# Patient Record
Sex: Male | Born: 1945 | Race: White | Hispanic: No | Marital: Married | Smoking: Never smoker
Health system: Southern US, Community
[De-identification: ages and names within clinical notes are randomized; demographics above are authoritative.]

## PROBLEM LIST (undated history)

## (undated) DIAGNOSIS — I739 Peripheral vascular disease, unspecified: Secondary | ICD-10-CM

## (undated) DIAGNOSIS — Z8719 Personal history of other diseases of the digestive system: Secondary | ICD-10-CM

## (undated) DIAGNOSIS — F419 Anxiety disorder, unspecified: Secondary | ICD-10-CM

## (undated) DIAGNOSIS — D649 Anemia, unspecified: Secondary | ICD-10-CM

## (undated) DIAGNOSIS — K219 Gastro-esophageal reflux disease without esophagitis: Secondary | ICD-10-CM

## (undated) DIAGNOSIS — N400 Enlarged prostate without lower urinary tract symptoms: Secondary | ICD-10-CM

## (undated) DIAGNOSIS — I2510 Atherosclerotic heart disease of native coronary artery without angina pectoris: Secondary | ICD-10-CM

## (undated) DIAGNOSIS — F32A Depression, unspecified: Secondary | ICD-10-CM

## (undated) DIAGNOSIS — G4700 Insomnia, unspecified: Secondary | ICD-10-CM

## (undated) DIAGNOSIS — E785 Hyperlipidemia, unspecified: Secondary | ICD-10-CM

## (undated) DIAGNOSIS — I639 Cerebral infarction, unspecified: Secondary | ICD-10-CM

## (undated) DIAGNOSIS — E7800 Pure hypercholesterolemia, unspecified: Secondary | ICD-10-CM

## (undated) DIAGNOSIS — H9190 Unspecified hearing loss, unspecified ear: Secondary | ICD-10-CM

## (undated) HISTORY — DX: Depression, unspecified: F32.A

## (undated) HISTORY — DX: Atherosclerotic heart disease of native coronary artery without angina pectoris: I25.10

## (undated) HISTORY — DX: Hyperlipidemia, unspecified: E78.5

## (undated) HISTORY — DX: Gastro-esophageal reflux disease without esophagitis: K21.9

## (undated) HISTORY — PX: ROTATOR CUFF REPAIR: SHX139

## (undated) HISTORY — DX: Anxiety disorder, unspecified: F41.9

## (undated) HISTORY — DX: Pure hypercholesterolemia, unspecified: E78.00

## (undated) HISTORY — DX: Benign prostatic hyperplasia without lower urinary tract symptoms: N40.0

## (undated) HISTORY — DX: Insomnia, unspecified: G47.00

---

## 1997-09-01 HISTORY — PX: INGUINAL HERNIA REPAIR: SUR1180

## 2001-02-05 ENCOUNTER — Encounter (INDEPENDENT_AMBULATORY_CARE_PROVIDER_SITE_OTHER): Payer: Self-pay | Admitting: Specialist

## 2001-02-05 ENCOUNTER — Other Ambulatory Visit: Admission: RE | Admit: 2001-02-05 | Discharge: 2001-02-05 | Payer: Self-pay | Admitting: Gastroenterology

## 2006-09-01 DIAGNOSIS — I219 Acute myocardial infarction, unspecified: Secondary | ICD-10-CM

## 2006-09-01 HISTORY — DX: Acute myocardial infarction, unspecified: I21.9

## 2007-12-24 ENCOUNTER — Ambulatory Visit: Payer: Self-pay | Admitting: Cardiology

## 2008-05-09 ENCOUNTER — Ambulatory Visit: Payer: Self-pay | Admitting: Cardiology

## 2008-06-26 ENCOUNTER — Ambulatory Visit: Payer: Self-pay | Admitting: Cardiology

## 2008-06-26 LAB — CONVERTED CEMR LAB
ALT: 30 units/L (ref 0–53)
AST: 24 units/L (ref 0–37)
Albumin: 3.6 g/dL (ref 3.5–5.2)
Alkaline Phosphatase: 75 units/L (ref 39–117)
Bilirubin, Direct: 0.2 mg/dL (ref 0.0–0.3)
Cholesterol: 164 mg/dL (ref 0–200)
HDL: 26.6 mg/dL — ABNORMAL LOW (ref >39.0)
LDL Cholesterol: 115 mg/dL — ABNORMAL HIGH (ref 0–99)
Total Bilirubin: 1.4 mg/dL — ABNORMAL HIGH (ref 0.3–1.2)
Total CHOL/HDL Ratio: 6.2
Total Protein: 7 g/dL (ref 6.0–8.3)
Triglycerides: 110 mg/dL (ref 0–149)
VLDL: 22 mg/dL (ref 0–40)

## 2008-11-09 ENCOUNTER — Ambulatory Visit: Payer: Self-pay | Admitting: Cardiology

## 2009-01-01 ENCOUNTER — Telehealth: Payer: Self-pay | Admitting: Cardiology

## 2009-01-03 ENCOUNTER — Telehealth: Payer: Self-pay | Admitting: Cardiology

## 2009-02-28 ENCOUNTER — Encounter: Payer: Self-pay | Admitting: Cardiology

## 2009-03-01 ENCOUNTER — Encounter: Payer: Self-pay | Admitting: Cardiology

## 2009-03-02 ENCOUNTER — Telehealth: Payer: Self-pay | Admitting: Cardiology

## 2009-06-21 ENCOUNTER — Telehealth: Payer: Self-pay | Admitting: Cardiology

## 2009-07-03 ENCOUNTER — Telehealth: Payer: Self-pay | Admitting: Cardiology

## 2009-07-13 ENCOUNTER — Encounter: Payer: Self-pay | Admitting: Cardiology

## 2009-07-13 ENCOUNTER — Telehealth: Payer: Self-pay | Admitting: Cardiology

## 2009-07-13 ENCOUNTER — Ambulatory Visit: Payer: Self-pay

## 2009-07-13 DIAGNOSIS — R002 Palpitations: Secondary | ICD-10-CM | POA: Insufficient documentation

## 2009-07-17 ENCOUNTER — Telehealth: Payer: Self-pay | Admitting: Cardiology

## 2009-08-01 DIAGNOSIS — K219 Gastro-esophageal reflux disease without esophagitis: Secondary | ICD-10-CM | POA: Insufficient documentation

## 2009-08-01 DIAGNOSIS — K5190 Ulcerative colitis, unspecified, without complications: Secondary | ICD-10-CM | POA: Insufficient documentation

## 2009-08-01 DIAGNOSIS — E785 Hyperlipidemia, unspecified: Secondary | ICD-10-CM | POA: Insufficient documentation

## 2009-08-01 DIAGNOSIS — I25119 Atherosclerotic heart disease of native coronary artery with unspecified angina pectoris: Secondary | ICD-10-CM | POA: Insufficient documentation

## 2009-08-01 DIAGNOSIS — I2510 Atherosclerotic heart disease of native coronary artery without angina pectoris: Secondary | ICD-10-CM | POA: Insufficient documentation

## 2009-08-06 ENCOUNTER — Ambulatory Visit: Payer: Self-pay | Admitting: Cardiology

## 2009-08-13 ENCOUNTER — Telehealth (INDEPENDENT_AMBULATORY_CARE_PROVIDER_SITE_OTHER): Payer: Self-pay | Admitting: *Deleted

## 2009-08-14 ENCOUNTER — Encounter (HOSPITAL_COMMUNITY): Admission: RE | Admit: 2009-08-14 | Discharge: 2009-08-31 | Payer: Self-pay | Admitting: Cardiology

## 2009-08-14 ENCOUNTER — Ambulatory Visit: Payer: Self-pay | Admitting: Cardiovascular Disease

## 2009-08-14 ENCOUNTER — Ambulatory Visit: Payer: Self-pay | Admitting: Cardiology

## 2009-08-14 ENCOUNTER — Ambulatory Visit: Payer: Self-pay

## 2009-08-20 ENCOUNTER — Telehealth: Payer: Self-pay | Admitting: Cardiology

## 2009-09-04 ENCOUNTER — Encounter: Payer: Self-pay | Admitting: Cardiology

## 2009-09-04 LAB — CONVERTED CEMR LAB
ALT: 30 units/L (ref 0–53)
AST: 27 units/L (ref 0–37)
Albumin: 4 g/dL (ref 3.5–5.2)
Alkaline Phosphatase: 58 units/L (ref 39–117)
Bilirubin, Direct: 0.3 mg/dL (ref 0.0–0.3)
Cholesterol: 161 mg/dL (ref 0–200)
HDL: 38.9 mg/dL — ABNORMAL LOW (ref >39.00)
LDL Cholesterol: 91 mg/dL (ref 0–99)
TSH: 1.46 microintl units/mL (ref 0.35–5.50)
Total Bilirubin: 2.9 mg/dL — ABNORMAL HIGH (ref 0.3–1.2)
Total CHOL/HDL Ratio: 4
Total Protein: 7.1 g/dL (ref 6.0–8.3)
Triglycerides: 158 mg/dL — ABNORMAL HIGH (ref 0.0–149.0)
VLDL: 31.6 mg/dL (ref 0.0–40.0)

## 2009-11-22 ENCOUNTER — Encounter: Payer: Self-pay | Admitting: Cardiology

## 2010-04-25 ENCOUNTER — Encounter: Payer: Self-pay | Admitting: Cardiology

## 2010-05-01 ENCOUNTER — Ambulatory Visit: Payer: Self-pay | Admitting: Cardiology

## 2010-06-17 ENCOUNTER — Telehealth: Payer: Self-pay | Admitting: Cardiology

## 2010-09-13 ENCOUNTER — Encounter: Payer: Self-pay | Admitting: Cardiology

## 2010-09-19 ENCOUNTER — Telehealth: Payer: Self-pay | Admitting: Cardiology

## 2010-10-01 NOTE — Progress Notes (Signed)
Summary: having colonoscopy/ok to hold plavix and asa?  Phone Note Call from Patient Call back at [phone]   Caller: Patient Summary of Call: pt calling regarding a procedure and the meds he is taking Initial call taken by: Judie Grieve,  June 21, 2009 12:59 PM  Follow-up for Phone Call        having a colonoscopy next month, is it ok to hold asa and plavix for 3 days prior??  Sander Nephew, RN  Additional Follow-up for Phone Call Additional follow up Details #1::        I told him not to come off the Plavix. He needs to talk to his GI doctor about doing a diagnostic colonoscopy only on Plavix.

## 2010-10-01 NOTE — Assessment & Plan Note (Signed)
Summary: Cardiology Nuclear Study  Nuclear Med Background Indications for Stress Test: Evaluation for Ischemia   History: Heart Catheterization, Stents  History Comments: 8/08 Heart Cath > Stents (3) RCA  Symptoms: DOE, Palpitations    Nuclear Pre-Procedure Cardiac Risk Factors: Lipids Caffeine/Decaff Intake: None NPO After: 7:00 PM Lungs: clear IV 0.9% NS with Angio Cath: 22g     IV Site: (R) AC IV Started by: Irean Hong RN Chest Size (in) 42     Height (in): 73 Weight (lb): 206 BMI: 27.28 Tech Comments: Held toprol 24 hrs.  Nuclear Med Study 1 or 2 day study:  1 day     Stress Test Type:  Stress Reading MD:  Charlton Haws, MD     Referring MD:  J.Hochrein Resting Radionuclide:  Technetium 27m Tetrofosmin     Resting Radionuclide Dose:  11 mCi  Stress Radionuclide:  Technetium 40m Tetrofosmin     Stress Radionuclide Dose:  33 mCi   Stress Protocol Exercise Time (min):  9:00 min     Max HR:  139 bpm     Predicted Max HR:  157 bpm  Max Systolic BP: 154 mm Hg     Percent Max HR:  88.54 %     METS: 10.10 Rate Pressure Product:  53664    Stress Test Technologist:  Milana Na EMT-P     Nuclear Technologist:  Domenic Polite CNMT  Rest Procedure  Myocardial perfusion imaging was performed at rest 45 minutes following the intravenous administration of Myoview Technetium 34m Tetrofosmin.  Stress Procedure  The patient exercised for 9:00. The patient stopped due to fatigue and denied any chest pain.  There were no significant ST-T wave changes, occ pvcs with exercise and freq pvcs in recovery.  Myoview was injected at peak exercise and myocardial perfusion imaging was performed after a brief delay.  QPS Raw Data Images:  Normal; no motion artifact; normal heart/lung ratio. Stress Images:  Inferior defect Rest Images:  Ifneriro defect Subtraction (SDS):  SDS 0 Transient Ischemic Dilatation:  .83  (Normal <1.22)  Lung/Heart Ratio:  .37  (Normal <0.45)  Quantitative  Gated Spect Images QGS EDV:  96 ml QGS ESV:  32 ml QGS EF:  67 % QGS cine images:  normal  Findings Low risk nuclear study  Evidence for inferior infarct     Overall Impression  Exercise Capacity: Good exercise capacity. BP Response: Normal blood pressure response. Clinical Symptoms: No chest pain ECG Impression: No significant ST segment change suggestive of ischemia. 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  No further testing at this point.  Continue with primary risk reduction.  Appended Document: Cardiology Nuclear Study pt aware of results

## 2010-10-01 NOTE — Miscellaneous (Signed)
Summary: Orders Update  Clinical Lists Changes  Problems: Added new problem of PALPITATIONS (ICD-785.1) Orders: Added new Referral order of Holter Monitor (Holter Monitor) - Signed 

## 2010-10-01 NOTE — Progress Notes (Signed)
Summary: problems with medications  Phone Note Call from Patient Call back at [phone]   Caller: Patient Reason for Call: Talk to Nurse Summary of Call: Problems with medications.  Primary MD suggested he call to discuss. Initial call taken by: Burnard Leigh,  March 02, 2009 3:19 PM  Follow-up for Phone Call        per pt call states metoprolol was changed about 1 month ago from 100mg  daily to 50mg  two times a day and he is now having alot of dizziness in the afternoons to were he feels like he will pass out.  pt requested to to changes back to 100mg  daily.  rx will be sent into [facility] on [address], [place], [place] Follow-up by: Charolotte Capuchin, RN,  March 02, 2009 3:41 PM

## 2010-10-01 NOTE — Letter (Signed)
Summary: [facility] Office Note  [facility] Office Note   Imported By: Roderic Ovens 12/03/2009 16:00:01  _____________________________________________________________________  External Attachment:    Type:   Image     Comment:   External Document

## 2010-10-01 NOTE — Letter (Signed)
Summary: Lipid/Liver Garment/textile technologist, Main Office  [address]   [place], [place] [address]   Phone: [phone]  Fax: [phone]         May 01, 2010 [name]: [id number]     Martin [name] [address] [place], [place]  [address]     Dear [name],  During your last appointment, Dr.  Antoine Poche requested you have fasting lab work to check your lipid and liver (DX: 272.2, V58.69).  Please have this blood work done in December 2011.  It is important that patients and their doctor work together in the management/treatment of their health care.  If you have already had your blood work drawn, please disregard this letter.  If you have not had your blood work drawn, please call [phone] or the number listed above to schedule an appointment.    Please bring this letter with you for your blood work.  Also, please remember not to eat or drink anything after midnight the night before.      Thank you,     Sander Nephew, RN for Dr. [facility] Rot[facility]

## 2010-10-01 NOTE — Progress Notes (Signed)
Summary: Nuclear Pre-procedure  Phone Note Outgoing Call Call back at [facility] Phone [phone]   Call placed by: Stanton Kidney, EMT-P,  August 13, 2009 1:42 PM Action Taken: Phone Call Completed Summary of Call: Reviewed information on Myoview Information Sheet (see scanned document for further details).  Spoke with Patients wife.    Nuclear Med Background Indications for Stress Test: Evaluation for Ischemia   History: Heart Catheterization, Stents  History Comments: 8/08 Heart Cath > Stents (3) RCA  Symptoms: DOE, Palpitations    Nuclear Pre-Procedure Cardiac Risk Factors: Lipids Height (in): 73

## 2010-10-01 NOTE — Letter (Signed)
Summary: Custom - Lipid  [facility], Main Office  [address]   [place], [place] [address]   Phone: [phone]  Fax: [phone]         September 04, 2009 [name]: [id number]     [name] [address] [place], [place]  [address]   Dear Martin [name],  We have reviewed your cholesterol results.  They are as follows:     Total Cholesterol:    161 (Desirable: less than 200)       HDL  Cholesterol:     38.90  (Desirable: greater than 40 for men and 50 for women)       LDL Cholesterol:       91  (Desirable: less than 100 for low risk and less than 70 for moderate to high risk)       Triglycerides:       158.0  (Desirable: less than 150)  Our recommendations include:  continue the same/looks good!!   Call our office at the number listed above if you have any questions.  Lowering your LDL cholesterol is important, but it is only one of a large number of "risk factors" that may indicate that you are at risk for heart disease, stroke or other complications of hardening of the arteries.  Other risk factors include:   A.  Cigarette Smoking* B.  High Blood Pressure* C.  Obesity* D.   Low HDL Cholesterol (see yours above)* E.   Diabetes Mellitus (higher risk if your is uncontrolled) F.  Family history of premature heart disease G.  Previous history of stroke or cardiovascular disease          *These are risk factors YOU HAVE CONTROL OVER.  For more information, visit .  There is now evidence that lowering the TOTAL CHOLESTEROL AND LDL CHOLESTEROL can reduce the risk of heart disease.  The American Heart Association recommends the following guidelines for the treatment of elevated cholesterol:  1.  If there is now current heart disease and less than two risk factors, TOTAL CHOLESTEROL should be less than 200 and LDL CHOLESTEROL should be less than 100. 2.  If there is current heart disease or two or more risk factors, TOTAL CHOLESTEROL should be less than  200 and LDL CHOLESTEROL should be less than 70.  A diet low in cholesterol, saturated fat, and calories is the cornerstone of treatment for elevated cholesterol.  Cessation of smoking and exercise are also important in the management of elevated cholesterol and preventing vascular disease.  Studies have shown that 30 to 60 minutes of physical activity most days can help lower blood pressure, lower cholesterol, and keep your weight at a healthy level.  Drug therapy is used when cholesterol levels do not respond to therapeutic lifestyle changes (smoking cessation, diet, and exercise) and remains unacceptably high.  If medication is started, it is important to have you levels checked periodically to evaluate the need for further treatment options.  Thank you,    Nolen Mu for Dr [facility] Team

## 2010-10-01 NOTE — Miscellaneous (Signed)
  Clinical Lists Changes  Observations: Added new observation of NUCLEAR NOS: Exercise Capacity: Good exercise capacity. BP Response: Normal blood pressure response. Clinical Symptoms: No chest pain ECG Impression: No significant ST segment change suggestive of ischemia. 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 BP Response: Normal blood pressure response. Clinical Symptoms: No chest pain ECG Impression: No significant ST segment change suggestive of ischemia. 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

## 2010-10-01 NOTE — Progress Notes (Signed)
Summary: BEEN HAVING IRREGULAR HEART BEATS  GOING ON FOR FEW DAYS  Phone Note Call from Patient Call back at Home Phone [phone] Call back at [phone]   Caller: Patient Summary of Call: PT HAVING IRRGULAR HEART BEATS FOR A FEW DAYS Initial call taken by: Judie Grieve,  July 13, 2009 9:23 AM  Follow-up for Phone Call        FUNNY FEELING WITH A LITTLE BIT A PAIN EVERY NOW AND THEN.  WIFE CHECKED HIS PULSE LAST NIGHT AND SAID IT FEELS  LIKE IT SKIPS EVERY NOW AND THEN, NOT DOING IT TODAY.  HAS HAPPENED FOR A COUPLE OR 3 DAYS.  HE NOTICES IT MORE WHILE SITTTING DOWN CAUSING HIM TO FEEL SOB NO DIZZINESS.  NO HX OF IRREGULAR HEART BEATS  PAM FLEMING-HAYES,RN DR Jisele Price AWARE OF ABOVE INFORMATION AND HAS GIVENA VERBAL ORDER FOR PT TO WEAR A 48 HOUR HOLTER MONITOR FOR PALPITATION.  PT AWARE. Follow-up by: Charolotte Capuchin, RN,  July 13, 2009 10:45 AM

## 2010-10-01 NOTE — Progress Notes (Signed)
Summary: rx viagra  Phone Note Call from Patient Call back at [phone]   Caller: Patient Reason for Call: Talk to Nurse Summary of Call: REQ TO TALK TO NURSE ABOUT MEDS Initial call taken by: Migdalia Dk,  July 03, 2009 2:55 PM  Follow-up for Phone Call        pt wondering if Dr Antoine Poche can give him an rx for Viagra  [facility] in [place], [place]  Sander Nephew, RN  Additional Follow-up for Phone Call Additional follow up Details #1::        I reviewed recent notes and med list.  He is on no long term meds that would interact.  He can take  Viagra if he is not using other nitrates and has had no med changes or recent onset of chest pain. per Dr Antoine Poche. Pt is aware of above. Denies any medications other than those on his list and denies chest pain.  Additional Follow-up by: Charolotte Capuchin, RN,  July 03, 2009 5:49 PM    New/Updated Medications: VIAGRA 50 MG TABS (SILDENAFIL CITRATE) 1 by mouth 1 hour prior to sexual activity Prescriptions: VIAGRA 50 MG TABS (SILDENAFIL CITRATE) 1 by mouth 1 hour prior to sexual activity  #10 x 1   Entered by:   Charolotte Capuchin, RN   Authorized by:   Rollene Rotunda, MD, [facility]   Signed by:   Charolotte Capuchin, RN on 07/03/2009   Method used:   Electronically to        [facility] [address] (retail)       [address]       [place], [place]  [address]       Ph: [phone] or [phone]       Fax: [phone]   RxID:   [id number]

## 2010-10-01 NOTE — Assessment & Plan Note (Signed)
Summary: f/u pvc's appt at 2pm  pfh, rn   Visit Type:  Follow-up Primary Provider:  Dr. Feliciana Rossetti  CC:  Palpitatoins and CAD.  History of Present Illness: The patient presents for followup. Since I last saw him he called to tell us of increasing palpitations. These were happening with frequency. He notices them now still. He describes skipped beats. They are more noticeable when he is at rest.  He has had no presyncope or syncope. He is not having any chest pressure, neck or arm discomfort. However, he has noticed some increased dyspnea with activity.  He is not describing PND or orthopnea.  A Holter monitor demonstrated frequent ventricular ectopy approximately 5% of his total beats. He had bigeminy and couplets noted. There was no nonsustained ventricular tachycardia. The patient does drink 2 cups of coffee per day but this is unchanged.  Current Medications (verified): 1)  Niaspan 1000 Mg Cr-Tabs (Niacin (Antihyperlipidemic)) .... Take 2 Tablet By Mouth Once A Day 2)  Nitroglycerin 0.4 Mg Subl (Nitroglycerin) .... Place 1 Tablet Under Tongue As Directed 3)  Plavix 75 Mg Tabs (Clopidogrel Bisulfate) .... Take 1 Tablet By Mouth Once A Day 4)  Simvastatin 40 Mg Tabs (Simvastatin) .... Take 1 Tablet By Mouth Once A Day 5)  Toprol Xl 50 Mg Xr24h-Tab (Metoprolol Succinate) .Marland Kitchen.. 1 By Mouth Daily 6)  Viagra 50 Mg Tabs (Sildenafil Citrate) .Marland Kitchen.. 1 By Mouth 1 Hour Prior To Sexual Activity 7)  Aspirin 81 Mg  Tabs (Aspirin) .Marland Kitchen.. 1 By Mouth Daily 8)  Asacol 400 Mg Tbec (Mesalamine) .... 2 By Mouth Two Times A Day 9)  Loratadine 10 Mg Tabs (Loratadine) .Marland Kitchen.. 1 By Mouth Daily 10)  Flomax 0.4 Mg Caps (Tamsulosin Hcl) .Marland Kitchen.. 1 By Mouth Daily 11)  [facility] Spectravite Senior  Tabs (Multiple Vitamins-Minerals) .Marland Kitchen.. 1 By Mouth Daily 12)  Multivitamins   Tabs (Multiple Vitamin) .Marland Kitchen.. 1 By Mouth Daily 13)  Zantac 75 75 Mg Tabs (Ranitidine Hcl) .Marland Kitchen.. 1 By Mouth Daily  Allergies (verified): 1)  ! Morphine  Past  History:  Past Medical History: 1. Coronary artery disease (catheterization in August 2008 with 90-95%       right coronary artery stenosis.  He had a drug-eluting stent       placed.  He had an acute stent thrombosis in the hospital that day       and subsequently had 3 drug-coated stents placed in the right       coronary artery.  The LAD had 45% stenosis, in the circumflex had       35% stenosis.  The EF was well preserved).   2. Gastroesophageal reflux disease.   3. Dyslipidemia.   4. Benign prostatic hypertrophy.   5. Ulcerative colitis.   Past Surgical History: Inguinal hernia repair in 1999  Review of Systems       As stated in the HPI and negative for all other systems.   Vital Signs:  Patient profile:   65 year old male Height:      73 inches Weight:      206 pounds BMI:     27.28 Pulse rate:   73 / minute Resp:     16 per minute BP sitting:   127 / 67  (right arm)  Vitals Entered By: Marrion Coy, CNA (August 06, 2009 1:55 PM)  Physical Exam  General:  Well developed, well nourished, in no acute distress. Head:  normocephalic and atraumatic Eyes:  PERRLA/EOM intact;  conjunctiva and lids normal. Mouth:  Upper dentures, gums and palate normal. Oral mucosa normal. Neck:  Neck supple, no JVD. No masses, thyromegaly or abnormal cervical nodes. Chest Wall:  no deformities or breast masses noted Lungs:  Clear bilaterally to auscultation and percussion. Abdomen:  Bowel sounds positive; abdomen soft and non-tender without masses, organomegaly, or hernias noted. No hepatosplenomegaly. Msk:  Back normal, normal gait. Muscle strength and tone normal. Extremities:  No clubbing or cyanosis. Neurologic:  Alert and oriented x 3. Skin:  Intact without lesions or rashes. Cervical Nodes:  no significant adenopathy Axillary Nodes:  no significant adenopathy Inguinal Nodes:  no significant adenopathy Psych:  Normal affect.   Detailed Cardiovascular Exam  Neck     Carotids: Carotids full and equal bilaterally without bruits.      Neck Veins: Normal, no JVD.    Heart    Inspection: no deformities or lifts noted.      Palpation: normal PMI with no thrills palpable.      Auscultation: regular rate and rhythm, S1, S2 without murmurs, rubs, gallops, or clicks.    Vascular    Abdominal Aorta: no palpable masses, pulsations, or audible bruits.      Femoral Pulses: normal femoral pulses bilaterally.      Pedal Pulses: normal pedal pulses bilaterally.      Radial Pulses: normal radial pulses bilaterally.      Peripheral Circulation: no clubbing, cyanosis, or edema noted with normal capillary refill.     Impression & Recommendations:  Problem # 1:  PALPITATIONS (ICD-785.1) The patient has increasing ventricular ectopy with couplets and bigeminy. He does have some mild increased dyspnea with exertion. He has coronary disease as described above. Given this the pretest probability of obstructive coronary disease is high. Stress perfusion imaging is indicated. The patient will present for this. At that time I will also check a TSH. We did discuss symptomatic treatment of this. If there is no reversible cause and he is found to have a well-preserved ejection fraction on stress testing and no ischemia he would prefer conservative therapy. He would consider stopping caffeine as a first step. Orders: Nuclear Stress Test (Nuc Stress Test)  Problem # 2:  DYSLIPIDEMIA (ICD-272.4) His primary care doctor was kind enough to send his lipid profile this summer. His HDL was low. We increased his Niaspan from 1000-2000 mg daily. He was also instructed to increase his exercise. I will have him repeat a fasting lipid profile when he comes back for a stress test.  Problem # 3:  CAD (ICD-414.00) This will be evaluated as above. Orders: Nuclear Stress Test (Nuc Stress Test)  Patient Instructions: 1)  Your physician recommends that you schedule a follow-up appointment in 6  months with Dr Antoine Poche 2)  Your physician recommends that you continue on your current medications as directed. Please refer to the Current Medication list given to you today. 3)  Your physician recommends that you return for lab work the same day as your myoview for a fasting lipid, liver and tsh. 4)  Your physician has requested that you have an exercise stress myoview.  For further information please visit [email or web address]  Please follow instruction sheet, as given.

## 2010-10-01 NOTE — Progress Notes (Signed)
Summary: MED QUESTIONS  Phone Note From Pharmacy   Summary of Call: PHARAMICIST HAS A QUESTION CONCERNING PT MEDICATIONS THAT WERE SENT IN E-PERSCRIBE. [phone]. [facility] Initial call taken by: Edman Circle,  Jan 03, 2009 9:34 AM  Follow-up for Phone Call        [facility]  pharmacist Okey Dupre said that pt. has been on metoprolol ER dailey prescrived by another cardiologist. On 01/02/09 send on E-PERSCRIBE Metoprolol tatrate 50 mg two times a day. Does MD wants to change medication from long acting to short acting. According to [facility] CNA  Pt. has been on Metoprolol tatrate 50 mg two times a day. She got the dosage from pt. medication bottle. Pharmacist notified.  Ollen Gross, RN, BSN  Jan 03, 2009 10:29 AM

## 2010-10-01 NOTE — Assessment & Plan Note (Signed)
Summary: PER CHECK OUT/SF   Visit Type:  Follow-up Primary Provider:  Dr. Feliciana Rossetti  CC:  CAD.  History of Present Illness: The patient presents for followup of his known coronary disease. At the last appointment he had some dyspnea. I evaluated him with a stress perfusion study which demonstrated an old infarct but a preserved ejection fraction and no ischemia. He has since had no further complaints. He is getting no dyspnea. He has no chest pressure, neck or arm discomfort. He's had no palpitations, presyncope or syncope. He is dieting and walking and has lost 8 pounds.  Current Medications (verified): 1)  Niaspan 1000 Mg Cr-Tabs (Niacin (Antihyperlipidemic)) .... Take 2 Tablet By Mouth Once A Day 2)  Nitroglycerin 0.4 Mg Subl (Nitroglycerin) .... Place 1 Tablet Under Tongue As Directed 3)  Plavix 75 Mg Tabs (Clopidogrel Bisulfate) .... Take 1 Tablet By Mouth Once A Day 4)  Simvastatin 40 Mg Tabs (Simvastatin) .... Take 1 Tablet By Mouth Once A Day 5)  Toprol Xl 50 Mg Xr24h-Tab (Metoprolol Succinate) .Marland Kitchen.. 1 By Mouth Daily 6)  Viagra 50 Mg Tabs (Sildenafil Citrate) .Marland Kitchen.. 1 By Mouth 1 Hour Prior To Sexual Activity 7)  Aspirin 81 Mg  Tabs (Aspirin) .Marland Kitchen.. 1 By Mouth Daily 8)  Asacol 400 Mg Tbec (Mesalamine) .... 2 By Mouth Two Times A Day 9)  Loratadine 10 Mg Tabs (Loratadine) .Marland Kitchen.. 1 By Mouth Daily 10)  Flomax 0.4 Mg Caps (Tamsulosin Hcl) .Marland Kitchen.. 1 By Mouth Daily 11)  [facility] Spectravite Senior  Tabs (Multiple Vitamins-Minerals) .Marland Kitchen.. 1 By Mouth Daily 12)  Multivitamins   Tabs (Multiple Vitamin) .Marland Kitchen.. 1 By Mouth Daily 13)  Zantac 75 75 Mg Tabs (Ranitidine Hcl) .Marland Kitchen.. 1 By Mouth Daily  Allergies (verified): 1)  ! Morphine  Past History:  Past Medical History: Reviewed history from 08/06/2009 and no changes required. 1. Coronary artery disease (catheterization in August 2008 with 90-95%       right coronary artery stenosis.  He had a drug-eluting stent       placed.  He had an acute stent  thrombosis in the hospital that day       and subsequently had 3 drug-coated stents placed in the right       coronary artery.  The LAD had 45% stenosis, in the circumflex had       35% stenosis.  The EF was well preserved).   2. Gastroesophageal reflux disease.   3. Dyslipidemia.   4. Benign prostatic hypertrophy.   5. Ulcerative colitis.   Past Surgical History: Reviewed history from 08/06/2009 and no changes required. Inguinal hernia repair in 1999  Review of Systems       As stated in the HPI and negative for all other systems.   Vital Signs:  Patient profile:   65 year old male Height:      73 inches Weight:      198 pounds BMI:     26.22 Pulse rate:   58 / minute Resp:     16 per minute BP sitting:   104 / 68  (right arm)  Vitals Entered By: Marrion Coy, CNA (May 01, 2010 9:03 AM)  Physical Exam  General:  Well developed, well nourished, in no acute distress. Head:  normocephalic and atraumatic Neck:  Neck supple, no JVD. No masses, thyromegaly or abnormal cervical nodes. Chest Wall:  no deformities or breast masses noted Lungs:  Clear bilaterally to auscultation and percussion. Heart:  Non-displaced PMI, chest non-tender;  regular rate and rhythm, S1, S2 without murmurs, rubs or gallops. Carotid upstroke normal, no bruit. Normal abdominal aortic size, no bruits. Femorals normal pulses, no bruits. Pedals normal pulses. No edema, no varicosities. Abdomen:  Bowel sounds positive; abdomen soft and non-tender without masses, organomegaly, or hernias noted. No hepatosplenomegaly. Msk:  Back normal, normal gait. Muscle strength and tone normal. Pulses:  pulses normal in all 4 extremities Extremities:  No clubbing or cyanosis. Neurologic:  Alert and oriented x 3. Skin:  Intact without lesions or rashes. Cervical Nodes:  no significant adenopathy Axillary Nodes:  no significant adenopathy Inguinal Nodes:  no significant adenopathy Psych:  Normal  affect.   EKG  Procedure date:  05/01/2010  Findings:      Sinus rhythm, rate 58, axis within normal limits, intervals within normal limits, no acute ST-T wave changes.  Impression & Recommendations:  Problem # 1:  CAD (ICD-414.00) The patient has no new symptoms. He will continue with risk reduction. Orders: EKG w/ Interpretation (93000)  Problem # 2:  DYSLIPIDEMIA (ICD-272.4)  I will ask him to get a lipid profile in December. He was at target at his last check.  His updated medication list for this problem includes:    Niaspan 1000 Mg Cr-tabs (Niacin (antihyperlipidemic)) .Marland Kitchen... Take 2 tablet by mouth once a day    Simvastatin 40 Mg Tabs (Simvastatin) .Marland Kitchen... Take 1 tablet by mouth once a day  Patient Instructions: 1)  Your physician recommends that you schedule a follow-up appointment in: 12 months with Dr Antoine Poche 2)  Your physician recommends that you continue on your current medications as directed. Please refer to the Current Medication list given to you today. Prescriptions: NITROGLYCERIN 0.4 MG SUBL (NITROGLYCERIN) Place 1 tablet under tongue as directed  #25 x prn   Entered by:   Charolotte Capuchin, RN   Authorized by:   Rollene Rotunda, MD, [facility]   Signed by:   Charolotte Capuchin, RN on 05/01/2010   Method used:   Electronically to        [facility] (retail)       [address]       [place], [place]  [address]       Ph: [phone] or [phone]       Fax: [phone]   RxID:   [id number]

## 2010-10-01 NOTE — Letter (Signed)
Summary: [facility]  [facility]   Imported By: Roderic Ovens 04/10/2009 15:18:57  _____________________________________________________________________  External Attachment:    Type:   Image     Comment:   External Document

## 2010-10-01 NOTE — Progress Notes (Signed)
Summary: refill request  Phone Note Call from Patient   Reason for Call: Talk to Nurse Summary of Call: pt's prescriptions were to be sent to [facility] and not [facility] [address], he will  go ahead and pick those up because he will run out, can we call a new order to [facility] mail order? Initial call taken by: Glynda Jaeger,  June 17, 2010 8:46 AM    Prescriptions: TOPROL XL 50 MG XR24H-TAB (METOPROLOL SUCCINATE) 1 by mouth daily  #90 x 3   Entered by:   Judithe Modest CMA   Authorized by:   Rollene Rotunda, MD, [facility]   Signed by:   Judithe Modest CMA on 06/17/2010   Method used:   Faxed to ...       [facility] (mail-order)             , [place]         Ph: [phone]       Fax: [phone]   RxID:   [id number] SIMVASTATIN 40 MG TABS (SIMVASTATIN) Take 1 tablet by mouth once a day  #90 x 3   Entered by:   Judithe Modest CMA   Authorized by:   Rollene Rotunda, MD, [facility]   Signed by:   Judithe Modest CMA on 06/17/2010   Method used:   Faxed to ...       [facility] (mail-order)             , [place]         Ph: [phone]       Fax: [phone]   RxID:   [id number] PLAVIX 75 MG TABS (CLOPIDOGREL BISULFATE) Take 1 tablet by mouth once a day  #90 x 3   Entered by:   Judithe Modest CMA   Authorized by:   Rollene Rotunda, MD, [facility]   Signed by:   Judithe Modest CMA on 06/17/2010   Method used:   Faxed to ...       [facility] (mail-order)             , [place]         Ph: [phone]       Fax: [phone]   RxID:   [id number] NIASPAN 1000 MG CR-TABS (NIACIN (ANTIHYPERLIPIDEMIC)) Take 2 tablet by mouth once a day  #180 x 3   Entered by:   Judithe Modest CMA   Authorized by:   Rollene Rotunda, MD, [facility]   Signed by:   Judithe Modest CMA on 06/17/2010   Method used:   Faxed to ...       [facility] (mail-order)             , [place]         Ph: [phone]       Fax: [phone]   RxID:   [id number]

## 2010-10-01 NOTE — Progress Notes (Signed)
Summary: rx toprol  Medications Added NIASPAN 1000 MG CR-TABS (NIACIN (ANTIHYPERLIPIDEMIC)) Take 2 tablet by mouth once a day NITROGLYCERIN 0.4 MG SUBL (NITROGLYCERIN) Place 1 tablet under tongue as directed PLAVIX 75 MG TABS (CLOPIDOGREL BISULFATE) Take 1 tablet by mouth once a day SIMVASTATIN 40 MG TABS (SIMVASTATIN) Take 1 tablet by mouth once a day METOPROLOL TARTRATE 50 MG TABS (METOPROLOL TARTRATE) two times a day       Phone Note Refill Request Call back at [phone] Message from:  Patient  pt is on toprolol 50mg  and MD that perscibes it has retired and he needs a refill called into [facility] [place]  phone# [phone]  Initial call taken by: Omer Jack,  Jan 01, 2009 9:47 AM  Follow-up for Phone Call        rx sent to pharmacy pt notified Follow-up by: Marrion Coy, CNA,  Jan 02, 2009 12:27 PM    New/Updated Medications: NIASPAN 1000 MG CR-TABS (NIACIN (ANTIHYPERLIPIDEMIC)) Take 2 tablet by mouth once a day NITROGLYCERIN 0.4 MG SUBL (NITROGLYCERIN) Place 1 tablet under tongue as directed PLAVIX 75 MG TABS (CLOPIDOGREL BISULFATE) Take 1 tablet by mouth once a day SIMVASTATIN 40 MG TABS (SIMVASTATIN) Take 1 tablet by mouth once a day METOPROLOL TARTRATE 50 MG TABS (METOPROLOL TARTRATE) two times a day   Prescriptions: METOPROLOL TARTRATE 50 MG TABS (METOPROLOL TARTRATE) two times a day  #60 x 8   Entered by:   Marrion Coy, CNA   Authorized by:   Rollene Rotunda, MD, [facility]   Signed by:   Marrion Coy, CNA on 01/01/2009   Method used:   Electronically to        [facility]  [address] (retail)       [address]       [place], [place]  [address]       Ph: [phone] or [phone]       Fax: [phone]   RxID:   [id number]

## 2010-10-01 NOTE — Progress Notes (Signed)
Summary: test result from holter/    Phone Note Call from Patient Call back at Home Phone [phone] Call back at [phone]   Caller: Spouse- carol Reason for Call: Talk to Nurse, Lab or Test Results Details for Reason: pt having colonscopy on tomorrow. test result from the holter monitor.  Initial call taken by: Lorne Skeens,  July 17, 2009 11:53 AM  Follow-up for Phone Call        pt aware of results and appt scheduled with Dr Antoine Poche to f/u Follow-up by: Charolotte Capuchin, RN,  July 18, 2009 6:18 PM

## 2010-10-01 NOTE — Progress Notes (Signed)
Summary: STRESS TEST RESULTS  Phone Note Call from Patient Call back at Home Phone [phone]   Caller: Patient Summary of Call: STRESS TEST RESULTS PLEASE CALL PT WITH  RESULTS Initial call taken by: Judie Grieve,  August 20, 2009 11:33 AM  Follow-up for Phone Call        spoke with pt. Follow-up by: Charolotte Capuchin, RN,  August 20, 2009 11:53 AM

## 2010-10-01 NOTE — Letter (Signed)
[facility]   Imported By: Kassie Mends 04/11/2009 12:52:32  _____________________________________________________________________  External Attachment:    Type:   Image     Comment:   External Document

## 2010-10-01 NOTE — Procedures (Signed)
Summary: summary report  summary report   Imported By: Mirna Mires 08/07/2009 10:50:54  _____________________________________________________________________  External Attachment:    Type:   Image     Comment:   External Document

## 2010-10-03 NOTE — Progress Notes (Signed)
Summary: refill for plavix  Phone Note Refill Request Call back at [phone] Message from:  Patient  Refills Requested: Medication #1:  PLAVIX 75 MG TABS Take 1 tablet by mouth once a day pl call 10 day supply of plavix into [facility] in [place].    Initial call taken by: Park Breed,  September 19, 2010 9:51 AM  Follow-up for Phone Call        RX sent into pharmacy. Pt notified. Marrion Coy, CNA  September 19, 2010 10:59 AM  Follow-up by: Marrion Coy, CNA,  September 19, 2010 10:59 AM    Prescriptions: PLAVIX 75 MG TABS (CLOPIDOGREL BISULFATE) Take 1 tablet by mouth once a day  #10 x 0   Entered by:   Marrion Coy, CNA   Authorized by:   Rollene Rotunda, MD, [facility]   Signed by:   Marrion Coy, CNA on 09/19/2010   Method used:   Electronically to        [facility] [phone]* (retail)       [address]       [place], [place]  [address]       Ph: [phone] or [phone]       Fax: [phone]   RxID:   [id number]

## 2010-12-04 ENCOUNTER — Other Ambulatory Visit: Payer: Self-pay | Admitting: Cardiology

## 2010-12-25 NOTE — Telephone Encounter (Signed)
OK to prescribe Viagra 50 mg as needed.  He cannot take nitrates or alpha blockers.  10 pills with three refills.

## 2011-01-03 ENCOUNTER — Telehealth: Payer: Self-pay | Admitting: Cardiology

## 2011-01-03 NOTE — Telephone Encounter (Signed)
Pt calling to have his viagra RX refilled--according to our records this RX was sent 12/04/10, but pt states the pharmacy never received it--advised i would call in RX instead of e-scribing it and pt agrees  [employer] for viagra 50mg  take 1 tab 1 hour prior to sexual activity # 10--refill x3--nt

## 2011-01-03 NOTE — Telephone Encounter (Signed)
Authorized.

## 2011-01-03 NOTE — Telephone Encounter (Signed)
Pt needs his viagra to be called [facility] # [phone]. Pt states he has called and his pharmacy and no one has called his med in.  Pt would like someone to call him when its done

## 2011-01-14 NOTE — Assessment & Plan Note (Signed)
[facility]                            CARDIOLOGY OFFICE NOTE   NAME:[name], [name]                       [name]:          [id number]  DATE:05/09/2008                            DOB:          04/22/1946    PRIMARY CARE PHYSICIAN:  Dr. Wyonia [name].   REASON FOR PRESENTATION:  Coronary disease.   HISTORY OF PRESENT ILLNESS:  The patient presents for followup of the  above.  Since I last him, he has been quite active.  He does lot of  walking.  He thinks he gets 30 minutes with pretty vigorous activity 5  days a week.  He does not have any chest discomfort, neck, or arm  discomfort.  He does not have any palpitation, presyncope, or syncope.  He has been having no PND or orthopnea.   Of note, he did have his lipids checked in July.  He was noted to have  triglycerides of 204, his LDL calculated was 88, with an HDL of 32.  He  was supposed to start niacin 1000 mg daily, but he does not recall  getting this message.   PAST MEDICAL HISTORY:  Coronary artery disease (catheterization August  2008 with 90-95% abdominal right coronary artery stenosis.  He had a  drug-eluting stent placed.  He had an acute stent thrombosis in the  hospital that day.  He subsequently had 3 drug stents placed in the  right coronary artery.  The LAD had 45% stenosis and his circumflex had  a 35% stenosis.  His EF is well preserved), gastroesophageal reflux  disease, hyperlipidemia, benign prostatic hypertrophy, ulcerative  colitis, and inguinal hernia repair.   ALLERGIES:  MORPHINE and REGLAN.   MEDICATIONS:  Simvastatin 80 mg daily, aspirin 81 mg daily, Asacol 800  mg b.i.d., Plavix 75 mg daily, metoprolol 50 mg daily, loratadine 10 mg  daily, Flomax 0.4 mg daily, Prilosec, Spectravite.   REVIEW OF SYSTEMS:  As stated in the HPI, and otherwise negative for  other systems.   PHYSICAL EXAMINATION:  GENERAL:  The patient is in no distress.  VITAL SIGNS:  Blood pressure  120/50, heart rate 62 and regular, weight  219 pounds, and body mass index 29.  HEENT:  Eyes unremarkable.  Pupils equal, round and reactive to light,  fundi not visualize.  Oral mucosa remarkable.  NECK:  No jugular venous distention at 45 degrees, carotid upstroke  brisk and symmetrical, no bruit, no thyromegaly.  LYMPHATICS:  No cervical, axillary, or inguinal adenopathy.  LUNGS:  Clear to auscultation bilaterally.  BACK:  No costovertebral angle tenderness.  CHEST:  Unremarkable.  HEART:  PMI not displaced or sustained, S1 and S2 are within normal  limits, no S3, no S4, no clicks, no rubs, no murmurs.  ABDOMEN:  Obese, positive bowel sounds, normal in frequency and pitch,  no bruits, no rebound, no guarding.  No midline pulsatile mass,  hepatomegaly, or splenomegaly.  SKIN:  No rashes, no nodules.  EXTREMITIES:  Pulses 2+ throughout, no edema, no cyanosis, no clubbing.  NEURO:  Oriented to person, place and time, cranial nerve II  through XII  grossly intact.  Motor grossly intact.   ASSESSMENT AND PLAN:  1. Coronary artery disease.  The patient is having no new symptoms.      No further cardiovascular testing is suggested.  He will continue      with aggressive risk reduction.  2. Dyslipidemia.  I have taken the liberty of adding Niaspan 1000 mg      daily.  We discussed flushing techniques for taking this.  We      talked about drug interactions.  I am going to reduce her      simvastatin because of the risk of drug interaction with 80 mg of      simvastatin and Niaspan.  I think the 40 mg with Niaspan will be      fine.  I am going to keep a close eye on liver and any muscle      aches.  We will get a lipid and liver in 8 weeks.  3. Ulcerative colitis per his primary care physician.  4. Gastroesophageal reflux disease, he is not being bothered by this      currently.  5. Weight.  He understands the need to continue to lose weight with      diet and exercise.  His body mass  index is 29.  6. Followup.  We will see him back in 6 months, but we will be      checking the labs before then.     Martin Rotunda, MD, [facility]  Electronically Signed    JH/MedQ  DD: 05/09/2008  DT: 05/10/2008  Job #: [id number]   cc:   Martin [name]

## 2011-01-14 NOTE — Assessment & Plan Note (Signed)
[facility]                            CARDIOLOGY OFFICE NOTE   NAME:Martin [name], Martin [name]                       [name]:          [id number]  DATE:12/24/2007                            DOB:          [birth year]    PRIMARY:  Dr. Wyonia Hough.   REASON FOR PRESENTATION:  Evaluate the patient for coronary disease.   HISTORY OF PRESENT ILLNESS:  The patient is a pleasant 65 year old  gentleman with a history of coronary disease going back to August of  last year.  He had chest pain.  A stress perfusion study demonstrated an  old inferior infarct.  He subsequently had a cardiac catheterization  demonstrating a 90-95% dominant right coronary artery lesion.  He had a  drug-eluting stent placed to this.  He subsequently, that same day,  developed chest discomfort and was taken urgently back to the lab where  he was found to have stent thrombosis.  He had three-drug stents placed  bridging a dissection in the proximal portion before the stent and also  treating distal to the stent in the right coronary artery.  He had 45%  LAD stenosis and 35% circumflex disease that was managed medically.  His  ejection fraction was apparently well preserved.   Following this, the patient had done relatively well.  He had not been  having any of the chest discomfort that prompted his stress test.  He is  not overly active though he does yard work.  With this level of activity  he denies any chest discomfort, neck discomfort, arm discomfort,  activity induced nausea, vomiting, or diaphoresis.  He has no  palpitation, presyncope, or syncope.  Denies any PND or orthopnea.   PAST MEDICAL HISTORY:  Coronary artery disease as described,  gastroesophageal reflux disease, hyperlipidemia, benign prostatic  hypertrophy, ulcerative colitis.   PAST SURGICAL HISTORY:  Inguinal hernia repair 1999.   ALLERGIES:  MORPHINE, REGLAN.   MEDICATIONS:  Simvastatin 80 mg daily, aspirin 81 mg daily,  Asacol 800  mg b.i.d., Plavix 75 mg daily, metoprolol 50 mg daily, loratadine,  Flomax, Prilosec, Spectravite, nitroglycerin.   SOCIAL HISTORY:  The patient is retired Nurse, adult.  He likes  woodworking.  He is married.  He has two children and two grandchildren.  Does not smoke cigarettes, or drink alcohol.   FAMILY HISTORY:  Remarkable for his brother having rheumatic heart  disease and dying at age 32 of heart attack.  His father died at 86 of  an MI and his mother had an MI at 67.   REVIEW OF SYSTEMS:  As stated in the HPI and positive for reflux,  seasonal allergies.  Negative for other systems.   PHYSICAL EXAMINATION:  The patient is in no distress.  Blood pressure 121/77, heart rate 72, weight 224 pounds.  HEENT:  Eyelids unremarkable.  Pupils are equal, round, and reactive to  light and accommodation.  Fundi are not visualized.  Oral mucosa  unremarkable.  NECK:  No jugular venous distension at 45 degrees, carotid upstroke  brisk and symmetric, no bruits, thyromegaly.  LYMPHATICS:  No cervical,  axillary, or inguinal adenopathy.  LUNGS:  Clear to auscultation bilaterally.  BACK:  No costovertebral angle tenderness.  CHEST:  Unremarkable.  HEART:  PMI not displaced or sustained, S1 and S2 within normal limits,  no S3, no S4, no clicks, rubs, murmurs.  ABDOMEN:  Mildly obese, positive bowel sounds, normal in frequency and  pitch, no bruits, rebound, guarding.  No midline pulsatile mass,  hepatomegaly, splenomegaly.  SKIN:  No rashes, no nodules.  EXTREMITIES:  With 2+ pulses throughout, no edema, cyanosis, clubbing.  NEURO:  Oriented to person, place, and time, cranial nerves 2-12 grossly  intact, motor grossly intact.    EKG sinus rhythm, rate 70, axis within normal limits, intervals within  normal limits, premature ventricular contraction, no acute ST-wave  changes.   ASSESSMENT/PLAN:  1. Coronary disease.  The patient has coronary disease as described.      He is  having no further symptoms.  He now needs aggressive risk      reduction and we discussed this at length.  2. Hyperlipidemia.  I have asked him to get a repeat lipid profile      with Dr. Anthony Sar office.  I would be happy to review this.  I      think we need to be very aggressive with this gentleman.  He needs      to have an HDL above 50 if possible.  He needs an LDL less and 70.      He will probably need combination therapy.  We will continue to      discuss diet.  I also spent quite a bit time discussing exercise,      which would be important in managing multiple risk factors.  3. Ulcerative colitis remains an Asacol.  4. Medications.  The patient should not, Plavix unless he consults      with this office.  5. Obesity.  The patient's body mass index puts him in the obese      range.  We discussed weight loss with diet and exercise and will      continue to reinforce this.  6. Followup.  I would like to see him back in about 4 months as we are      getting to know each other.  I would like to continue to address      his risk factors.     Rollene Rotunda, MD, [facility]  Electronically Signed    JH/MedQ  DD: 12/24/2007  DT: 12/24/2007  Job #: [id number]   cc:   Dr. Wyonia Hough

## 2011-01-14 NOTE — Assessment & Plan Note (Signed)
[facility]                            CARDIOLOGY OFFICE NOTE   NAME:Martin [name], Martin [name]                       [name]:          [id number]  DATE:11/09/2008                            DOB:          03/29/1946    PRIMARY CARE PHYSICIAN:  Dr. Wyonia Hough.   REASON FOR PRESENTATION:  Evaluate the patient with coronary artery  disease.   HISTORY OF PRESENT ILLNESS:  The patient is 65 years old.  He presents  for followup of the above.  Since I last saw him, he has done well.  He  has had no new cardiovascular symptoms.  He has had no new chest  discomfort, neck or arm discomfort.  He has had no palpitations,  presyncope, or syncope.  He does not report having to take a sublingual  nitroglycerin since I saw him.  He is trying to do some walking, though  he does his couple of times a week.  With this level of activity, he  denies any chest pressure, neck or arm discomfort.  He has no  palpitation, presyncope, or syncope.  He has no PND or orthopnea.   PAST MEDICAL HISTORY:  1. Coronary artery disease (catheterization in August 2008 with 90-95%      right coronary artery stenosis.  He had a drug-eluting stent      placed.  He had an acute stent thrombosis in the hospital that day      and subsequently had 3 drug-coated stents placed in the right      coronary artery.  The LAD had 45% stenosis, in the circumflex had      35% stenosis.  The EF was well preserved).  2. Gastroesophageal reflux disease.  3. Dyslipidemia.  4. Benign prostatic hypertrophy.  5. Ulcerative colitis.  6. Inguinal hernia repair.   ALLERGIES:  MORPHINE and REGLAN.   MEDICATIONS:  1. Aspirin 81 mg daily.  2. Asacol 800 mg b.i.d.  3. Plavix 75 mg daily.  4. Metoprolol 50 mg b.i.d.  5. Loratadine 10 mg daily.  6. Flomax.  7. Prilosec.  8. Spectravite.  9. Multivitamin.  10.Simvastatin 40 mg at bedtime.  11.Niaspan 2000 mg at bedtime.   REVIEW OF SYSTEMS:  As stated in the HPI  and otherwise negative for  other systems.   PHYSICAL EXAMINATION:  GENERAL:  The patient is in no distress.  VITAL SIGNS:  Blood pressure 116/71, heart rate 70 and regular.  HEENT:  Eyelids unremarkable; pupils are equal, round, and reactive to  light; fundi not visualized; oral mucosa unremarkable.  NECK:  No jugular venous distention at 45 degrees, carotid upstroke  brisk and symmetric, no bruits, no thyromegaly.  LYMPHATICS:  No cervical, axillary, or inguinal adenopathy.  LUNGS:  Clear to auscultation bilaterally.  BACK:  No costovertebral angle tenderness.  CHEST:  Unremarkable.  HEART:  PMI not displaced or sustained; S1 and S2 within normal limits,  no S3, no S4; no clicks, no rubs, no murmurs.  ABDOMEN:  Obese; positive bowel sounds, normal in frequency and pitch;  no bruits, no rebound, no guarding, no  midline pulsatile mass; no  hepatomegaly, no splenomegaly.  SKIN:  No rashes, no nodules.  EXTREMITIES:  Pulses 2+, no edema.   ASSESSMENT AND PLAN:  1. Coronary artery disease.  The patient has coronary artery disease      as described.  He has done quite well with this.  He has had no      further cardiovascular symptoms.  Because of the extent of these      problems he had in his right coronary artery, I would suggest he      never come off his Plavix.  He understands the importance of this      medication and his aspirin.  2. Dyslipidemia.  Low HDL and LDL was not yet at target.  We increased      his Niaspan in October.  It is time for him to get another lipid      and liver and I will arrange this.  I would be happy to review this      with the goal LDL less than 100 and HDL greater than 40.  3. Obesity.  He understands the need to lose weight with diet and      exercise.  I have asked him to start exercising more frequently      than he is.  4. Followup.  I can see him back in 1 year or sooner if needed.     Rollene Rotunda, MD, [facility]  Electronically Signed     JH/MedQ  DD: 11/09/2008  DT: 11/10/2008  Job #: [id number]   cc:   Jacqualine Mau

## 2011-04-04 ENCOUNTER — Encounter: Payer: Self-pay | Admitting: Cardiology

## 2011-05-06 ENCOUNTER — Ambulatory Visit (INDEPENDENT_AMBULATORY_CARE_PROVIDER_SITE_OTHER): Payer: Medicare Other | Admitting: Cardiology

## 2011-05-06 ENCOUNTER — Encounter: Payer: Self-pay | Admitting: Cardiology

## 2011-05-06 DIAGNOSIS — I2510 Atherosclerotic heart disease of native coronary artery without angina pectoris: Secondary | ICD-10-CM

## 2011-05-06 DIAGNOSIS — R002 Palpitations: Secondary | ICD-10-CM

## 2011-05-06 DIAGNOSIS — N529 Male erectile dysfunction, unspecified: Secondary | ICD-10-CM

## 2011-05-06 DIAGNOSIS — E785 Hyperlipidemia, unspecified: Secondary | ICD-10-CM

## 2011-05-06 MED ORDER — SILDENAFIL CITRATE 50 MG PO TABS: 50.0000 mg | ORAL_TABLET | ORAL | Status: DC | PRN

## 2011-05-06 MED ORDER — NITROGLYCERIN 0.4 MG SL SUBL: 0.4000 mg | SUBLINGUAL_TABLET | SUBLINGUAL | Status: DC | PRN

## 2011-05-06 NOTE — Assessment & Plan Note (Signed)
I reviewed the last cholesterol with and LDL of 96 and HDL of 54 in Jan.  He will remain on the meds as listed.  I gave him some suggestion about the Niaspan which is causing flushing.

## 2011-05-06 NOTE — Progress Notes (Signed)
HPI The patient presents for follow up of CAD.  Since I last saw him he has done well.  The patient denies any new symptoms such as chest discomfort, neck or arm discomfort. There has been no new shortness of breath, PND or orthopnea. There have been no reported palpitations, presyncope or syncope.  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  This has been a labor intensive move.    Allergies  Allergen Reactions  . Morphine     Current Outpatient Prescriptions  Medication Sig Dispense Refill  . aspirin 81 MG tablet Take 81 mg by mouth daily.        . clopidogrel (PLAVIX) 75 MG tablet Take 75 mg by mouth daily.        Marland Kitchen loratadine (CLARITIN) 10 MG tablet Take 10 mg by mouth daily.        . mesalamine (ASACOL) 400 MG EC tablet Take 800 mg by mouth 2 (two) times daily.        . metoprolol (TOPROL-XL) 50 MG 24 hr tablet Take 50 mg by mouth daily.        . multivitamin (THERAGRAN) per tablet Take 1 tablet by mouth daily.        . niacin (NIASPAN) 1000 MG CR tablet Take 2,000 mg by mouth daily.        . nitroGLYCERIN (NITROSTAT) 0.4 MG SL tablet Place 0.4 mg under the tongue every 5 (five) minutes as needed.        . ranitidine (ZANTAC) 75 MG tablet Take 75 mg by mouth daily.        . simvastatin (ZOCOR) 40 MG tablet Take 40 mg by mouth daily.        . Tamsulosin HCl (FLOMAX) 0.4 MG CAPS Take 0.4 mg by mouth daily.        Marland Kitchen VIAGRA 50 MG tablet TAKE ONE TABLET BY MOUTH ONE HOUR PRIOR TO SEXUAL ACTIVITY  10 each  3    Past Medical History  Diagnosis Date  . GERD (gastroesophageal reflux disease)   . Dyslipidemia   . Benign prostatic hypertrophy   . Ulcerative colitis   . Coronary artery disease     cath 8/2008with 90-95% RCA stenosis. Had DES placed.  Had an acute stent thrombosis in the hospital that day and subsequently had 3 DES placed in the RCA. The LAD had 45% stenosis, in the circ had 35% stenosis. The EF was well preserved.    Past Surgical History    Procedure Date  . Inguinal hernia repair 1999    ROS:  As stated in the HPI and negative for all other systems.  PHYSICAL EXAM BP 118/70  Pulse 52  Resp 16  Ht 6\' 1"  (1.854 m)  Wt 208 lb (94.348 kg)  BMI 27.44 kg/m2 GENERAL:  Well appearing HEENT:  Pupils equal round and reactive, fundi not visualized, oral mucosa unremarkable NECK:  No jugular venous distention, waveform within normal limits, carotid upstroke brisk and symmetric, no bruits, no thyromegaly LYMPHATICS:  No cervical, inguinal adenopathy LUNGS:  Clear to auscultation bilaterally BACK:  No CVA tenderness CHEST:  Unremarkable HEART:  PMI not displaced or sustained,S1 and S2 within normal limits, no S3, no S4, no clicks, no rubs, no murmurs ABD:  Flat, positive bowel sounds normal in frequency in pitch, no bruits, no rebound, no guarding, no midline pulsatile mass, no hepatomegaly, no splenomegaly EXT:  2 plus pulses throughout, no edema, no cyanosis  no clubbing SKIN:  No rashes no nodules NEURO:  Cranial nerves II through XII grossly intact, motor grossly intact throughout PSYCH:  Cognitively intact, oriented to person place and time  EKG:  Sinus rhythm, rate  66, axis within normal limits, intervals within normal limits, no acute ST-T wave changes.   ASSESSMENT AND PLAN

## 2011-05-06 NOTE — Assessment & Plan Note (Signed)
The patient has no new sypmtoms.  No further cardiovascular testing is indicated.  We will continue with aggressive risk reduction and meds as listed.  

## 2011-05-06 NOTE — Patient Instructions (Signed)
Continue current medications as listed.  Follow up in 1 year with Dr Hochrein.  You will receive a letter in the mail 2 months before you are due.  Please call us when you receive this letter to schedule your follow up appointment.  

## 2011-05-23 ENCOUNTER — Other Ambulatory Visit: Payer: Self-pay | Admitting: Cardiology

## 2011-05-29 ENCOUNTER — Other Ambulatory Visit: Payer: Self-pay | Admitting: *Deleted

## 2011-05-29 ENCOUNTER — Telehealth: Payer: Self-pay | Admitting: Cardiology

## 2011-05-29 DIAGNOSIS — I2510 Atherosclerotic heart disease of native coronary artery without angina pectoris: Secondary | ICD-10-CM

## 2011-05-29 DIAGNOSIS — N529 Male erectile dysfunction, unspecified: Secondary | ICD-10-CM

## 2011-05-29 MED ORDER — SILDENAFIL CITRATE 50 MG PO TABS: 50.0000 mg | ORAL_TABLET | ORAL | Status: DC | PRN

## 2011-05-29 MED ORDER — NITROGLYCERIN 0.4 MG SL SUBL: 0.4000 mg | SUBLINGUAL_TABLET | SUBLINGUAL | Status: DC | PRN

## 2011-05-29 NOTE — Telephone Encounter (Signed)
Pt states that his pharmacy never reviewed RXs sent in on 05/06/11.  Rx's for SL Ntg and Viagra resent electronically thru Epic.  Pt is aware

## 2011-05-29 NOTE — Telephone Encounter (Signed)
Pt calling asking that Pam call him back. Please return pt call.

## 2011-05-30 ENCOUNTER — Other Ambulatory Visit: Payer: Self-pay | Admitting: Cardiology

## 2012-01-25 ENCOUNTER — Other Ambulatory Visit: Payer: Self-pay | Admitting: Cardiology

## 2012-02-01 ENCOUNTER — Other Ambulatory Visit: Payer: Self-pay | Admitting: Cardiology

## 2012-05-18 ENCOUNTER — Encounter: Payer: Self-pay | Admitting: Cardiology

## 2012-05-18 ENCOUNTER — Ambulatory Visit (INDEPENDENT_AMBULATORY_CARE_PROVIDER_SITE_OTHER): Payer: Medicare Other | Admitting: Cardiology

## 2012-05-18 ENCOUNTER — Other Ambulatory Visit: Payer: Self-pay | Admitting: *Deleted

## 2012-05-18 VITALS — BP 130/70 | HR 67 | Ht 73.0 in | Wt 200.1 lb

## 2012-05-18 DIAGNOSIS — E785 Hyperlipidemia, unspecified: Secondary | ICD-10-CM

## 2012-05-18 DIAGNOSIS — I2510 Atherosclerotic heart disease of native coronary artery without angina pectoris: Secondary | ICD-10-CM

## 2012-05-18 DIAGNOSIS — R002 Palpitations: Secondary | ICD-10-CM

## 2012-05-18 DIAGNOSIS — I25810 Atherosclerosis of coronary artery bypass graft(s) without angina pectoris: Secondary | ICD-10-CM

## 2012-05-18 MED ORDER — NITROGLYCERIN 0.4 MG SL SUBL: 0.4000 mg | SUBLINGUAL_TABLET | SUBLINGUAL | Status: DC | PRN

## 2012-05-18 NOTE — Progress Notes (Signed)
HPI The patient presents for follow up of CAD.  Since I last saw him he has done well.  He does quite a bit of walking without symptoms.  The patient denies any new symptoms such as chest discomfort, neck or arm discomfort. There has been no new shortness of breath, PND or orthopnea. There have been no reported palpitations, presyncope or syncope.  Allergies  Allergen Reactions  . Morphine     Current Outpatient Prescriptions  Medication Sig Dispense Refill  . aspirin 81 MG tablet Take 81 mg by mouth daily.        . clopidogrel (PLAVIX) 75 MG tablet TAKE 1 TABLET DAILY  90 tablet  3  . loratadine (CLARITIN) 10 MG tablet Take 10 mg by mouth daily.        . mesalamine (ASACOL) 400 MG EC tablet Take 800 mg by mouth 2 (two) times daily.        . metoprolol succinate (TOPROL-XL) 50 MG 24 hr tablet TAKE 1 TABLET DAILY  90 tablet  1  . multivitamin (THERAGRAN) per tablet Take 1 tablet by mouth daily.        . niacin (NIASPAN) 1000 MG CR tablet Take 2,000 mg by mouth daily.        . nitroGLYCERIN (NITROSTAT) 0.4 MG SL tablet Place 1 tablet (0.4 mg total) under the tongue every 5 (five) minutes as needed.  25 tablet  11  . ranitidine (ZANTAC) 75 MG tablet Take 75 mg by mouth daily.        . sildenafil (VIAGRA) 50 MG tablet Take 1 tablet (50 mg total) by mouth as needed for erectile dysfunction.  10 tablet  3  . simvastatin (ZOCOR) 40 MG tablet TAKE 1 TABLET DAILY  90 tablet  1  . Tamsulosin HCl (FLOMAX) 0.4 MG CAPS Take 0.4 mg by mouth daily.          Past Medical History  Diagnosis Date  . GERD (gastroesophageal reflux disease)   . Dyslipidemia   . Benign prostatic hypertrophy   . Ulcerative colitis   . Coronary artery disease     cath 8/2008with 90-95% RCA stenosis. Had DES placed.  Had an acute stent thrombosis in the hospital that day and subsequently had 3 DES placed in the RCA. The LAD had 45% stenosis, in the circ had 35% stenosis. The EF was well preserved.    Past Surgical  History  Procedure Date  . Inguinal hernia repair 1999    ROS:  As stated in the HPI and negative for all other systems.  PHYSICAL EXAM BP 130/70  Pulse 67  Ht 6\' 1"  (1.854 m)  Wt 200 lb 1.9 oz (90.774 kg)  BMI 26.40 kg/m2 GENERAL:  Well appearing NECK:  No jugular venous distention, waveform within normal limits, carotid upstroke brisk and symmetric, no bruits, no thyromegaly LYMPHATICS:  No cervical, inguinal adenopathy LUNGS:  Clear to auscultation bilaterally BACK:  No CVA tenderness CHEST:  Unremarkable HEART:  PMI not displaced or sustained,S1 and S2 within normal limits, no S3, no S4, no clicks, no rubs, no murmurs ABD:  Flat, positive bowel sounds normal in frequency in pitch, no bruits, no rebound, no guarding, no midline pulsatile mass, no hepatomegaly, no splenomegaly EXT:  2 plus pulses throughout, no edema, no cyanosis no clubbing  EKG:  Sinus rhythm, rate  67, axis within normal limits, intervals within normal limits, no acute ST-T wave changes.  05/18/2012   ASSESSMENT AND PLAN  CAD -  The patient has no new sypmtoms since his stress perfusion study in 12/10. No further cardiovascular testing is indicated. We will continue with aggressive risk reduction.  He can stop his Plavix.  I will likley screen him with an ETT next year.   DYSLIPIDEMIA -  I don't have a copy of his recent labs.  I would like to review these with an LDL goal less than 100 and HDL greater than 40.  I will obtain this from [facility], MD

## 2012-05-18 NOTE — Patient Instructions (Addendum)
Please stop your Plavix. Continue all other medications as listed.  Follow up in 1 year with Dr Hochrein.  You will receive a letter in the mail 2 months before you are due.  Please call us when you receive this letter to schedule your follow up appointment.  

## 2012-07-02 ENCOUNTER — Other Ambulatory Visit: Payer: Self-pay | Admitting: Cardiology

## 2012-07-02 NOTE — Telephone Encounter (Signed)
..   Requested Prescriptions   Pending Prescriptions Disp Refills  . simvastatin (ZOCOR) 40 MG tablet [Pharmacy Med Name: SIMVASTATIN TABS 40MG ] 90 tablet 3    Sig: TAKE 1 TABLET DAILY  . metoprolol succinate (TOPROL-XL) 50 MG 24 hr tablet [Pharmacy Med Name: METOPROLOL SUCCINATE ER TAB 50MG ] 90 tablet 3    Sig: TAKE 1 TABLET DAILY

## 2013-03-25 ENCOUNTER — Other Ambulatory Visit: Payer: Self-pay | Admitting: *Deleted

## 2013-03-25 MED ORDER — METOPROLOL SUCCINATE ER 50 MG PO TB24: ORAL_TABLET | ORAL | Status: DC

## 2013-05-24 ENCOUNTER — Ambulatory Visit: Payer: Medicare Other | Admitting: Cardiology

## 2013-07-07 ENCOUNTER — Encounter: Payer: Self-pay | Admitting: Cardiology

## 2013-07-07 ENCOUNTER — Ambulatory Visit (INDEPENDENT_AMBULATORY_CARE_PROVIDER_SITE_OTHER): Payer: Medicare Other | Admitting: Cardiology

## 2013-07-07 VITALS — BP 118/68 | HR 67 | Ht 73.0 in | Wt 213.0 lb

## 2013-07-07 DIAGNOSIS — I2510 Atherosclerotic heart disease of native coronary artery without angina pectoris: Secondary | ICD-10-CM

## 2013-07-07 MED ORDER — NITROGLYCERIN 0.4 MG SL SUBL: 0.4000 mg | SUBLINGUAL_TABLET | SUBLINGUAL | Status: DC | PRN

## 2013-07-07 NOTE — Progress Notes (Signed)
HPI The patient presents for follow up of CAD.  Since I last saw him he has done well.  He does quite a bit of walking skeet shooting.  He has no symptoms.  The patient denies any new symptoms such as chest discomfort, neck or arm discomfort. There has been no new shortness of breath, PND or orthopnea. There have been no reported palpitations, presyncope or syncope.  He has none of the symptoms that was his previous angina.  Allergies  Allergen Reactions  . Morphine     Current Outpatient Prescriptions  Medication Sig Dispense Refill  . aspirin 81 MG tablet Take 81 mg by mouth daily.        Marland Kitchen atorvastatin (LIPITOR) 40 MG tablet Take 40 mg by mouth daily at 6 PM.       . loratadine (CLARITIN) 10 MG tablet Take 10 mg by mouth daily.        . mesalamine (ASACOL) 400 MG EC tablet Take 800 mg by mouth 2 (two) times daily.        . metoprolol succinate (TOPROL-XL) 50 MG 24 hr tablet TAKE 1 TABLET DAILY  90 tablet  1  . multivitamin (THERAGRAN) per tablet Take 1 tablet by mouth daily.        . niacin (NIASPAN) 1000 MG CR tablet Take 2,000 mg by mouth daily.        . nitroGLYCERIN (NITROSTAT) 0.4 MG SL tablet Place 1 tablet (0.4 mg total) under the tongue every 5 (five) minutes as needed.  25 tablet  11  . ranitidine (ZANTAC) 75 MG tablet Take 75 mg by mouth daily.        . simvastatin (ZOCOR) 40 MG tablet TAKE 1 TABLET DAILY  90 tablet  3  . Tamsulosin HCl (FLOMAX) 0.4 MG CAPS Take 0.4 mg by mouth daily.        . sildenafil (VIAGRA) 50 MG tablet Take 1 tablet (50 mg total) by mouth as needed for erectile dysfunction.  10 tablet  3   No current facility-administered medications for this visit.    Past Medical History  Diagnosis Date  . GERD (gastroesophageal reflux disease)   . Dyslipidemia   . Benign prostatic hypertrophy   . Ulcerative colitis   . Coronary artery disease     cath 04/2007 with 90-95% RCA stenosis. Had DES placed.  Had an acute stent thrombosis in the hospital that day and  subsequently had 3 DES placed in the RCA. The LAD had 45% stenosis, in the circ had 35% stenosis. The EF was well preserved.    Past Surgical History  Procedure Laterality Date  . Inguinal hernia repair  1999    ROS:  As stated in the HPI and negative for all other systems.  PHYSICAL EXAM BP 118/68  Pulse 67  Ht 6\' 1"  (1.854 m)  Wt 213 lb (96.616 kg)  BMI 28.11 kg/m2 GENERAL:  Well appearing NECK:  No jugular venous distention, waveform within normal limits, carotid upstroke brisk and symmetric, no bruits, no thyromegaly LYMPHATICS:  No cervical, inguinal adenopathy LUNGS:  Clear to auscultation bilaterally BACK:  No CVA tenderness CHEST:  Unremarkable HEART:  PMI not displaced or sustained,S1 and S2 within normal limits, no S3, no S4, no clicks, no rubs, no murmurs ABD:  Flat, positive bowel sounds normal in frequency in pitch, no bruits, no rebound, no guarding, no midline pulsatile mass, no hepatomegaly, no splenomegaly EXT:  2 plus pulses throughout, no edema, no cyanosis no  clubbing  EKG:  Sinus rhythm, rate  67, axis within normal limits, intervals within normal limits, no acute ST-T wave changes.  07/07/2013   ASSESSMENT AND PLAN   CAD -  The patient has no new sypmtoms.  It has been four years since his last stress test.  I will bring the patient back for a POET (Plain Old Exercise Test). This will allow me to screen for obstructive coronary disease, risk stratify and very importantly provide a prescription for exercise.   DYSLIPIDEMIA -  He will get me a copy of his most recent lipids recent data suggests no benefit to the niacin for the sole purpose of raising his HDL so we can discontinue this. He otherwise remain on the meds as listed.

## 2013-07-07 NOTE — Patient Instructions (Signed)
Your physician wants you to follow-up in: 12 months. You will receive a reminder letter in the mail two months in advance. If you don't receive a letter, please call our office to schedule the follow-up appointment.  Your physician has requested that you have an exercise tolerance test. Can be done with NP or PA.  For further information please visit [email or web address] Please also follow instruction sheet, as given.   Your physician has recommended you make the following change in your medication: Stop Niacin.

## 2013-09-05 ENCOUNTER — Encounter: Payer: Medicare Other | Admitting: Nurse Practitioner

## 2013-09-14 ENCOUNTER — Other Ambulatory Visit: Payer: Self-pay | Admitting: Cardiology

## 2013-09-30 ENCOUNTER — Encounter (INDEPENDENT_AMBULATORY_CARE_PROVIDER_SITE_OTHER): Payer: Self-pay

## 2013-09-30 ENCOUNTER — Encounter: Payer: Self-pay | Admitting: Nurse Practitioner

## 2013-09-30 ENCOUNTER — Ambulatory Visit (INDEPENDENT_AMBULATORY_CARE_PROVIDER_SITE_OTHER): Payer: Medicare Other | Admitting: Nurse Practitioner

## 2013-09-30 VITALS — BP 143/81 | HR 73

## 2013-09-30 DIAGNOSIS — I2510 Atherosclerotic heart disease of native coronary artery without angina pectoris: Secondary | ICD-10-CM

## 2013-09-30 DIAGNOSIS — R079 Chest pain, unspecified: Secondary | ICD-10-CM

## 2013-09-30 NOTE — Progress Notes (Signed)
Exercise Treadmill Test  Pre-Exercise Testing Evaluation Rhythm: normal sinus  Rate: 64     Test  Exercise Tolerance Test Ordering MD: Angelina SheriffJake Hochrein, MD  Interpreting MD: Norma FredricksonLori Gerhardt, NP  Unique Test No: 1  Treadmill:  1  Indication for ETT: Chest pain, CAD  Contraindication to ETT: No   Stress Modality: exercise - treadmill  Cardiac Imaging Performed: non   Protocol: standard Bruce - maximal  Max BP:  186/78  Max MPHR (bpm):  153 85% MPR (bpm):  130  MPHR obtained (bpm):  131 % MPHR obtained:  86%  Reached 85% MPHR (min:sec):  6:33 Total Exercise Time (min-sec):  6:43  Workload in METS:  8.0 Borg Scale: 15  Reason ETT Terminated:  patient's desire to stop    ST Segment Analysis At Rest: normal ST segments - no evidence of significant ST depression With Exercise: no evidence of significant ST depression  Other Information Arrhythmia:  Yes; occasional PVC Angina during ETT:  absent (0) Quality of ETT:  diagnostic  ETT Interpretation:  normal - no evidence of ischemia by ST analysis  Comments: Patient presents today for routine GXT. Has known CAD with remote cath/PCI in 2008 with residual disease noted. Has had recent [place] visit [place] in [place] with negative evaluation reported. He has HTN and HLD. He does not exercise. No medicines were held for this study.   Today the patient exercised on the standard Bruce protocol for a total of 6:43 minutes.  Fair/reduced exercise tolerance.  Adequate blood pressure response.  Clinically negative for chest pain. Test was stopped due to fatigue/patient's desire to stop/achievement of target HR.  EKG negative for ischemia. No significant arrhythmia noted except for occasional PVCs - one couplet.   Recommendations: CV risk factor modification. Needs to walk daily.   See back as planned - sooner if has recurrent chest pain.   Patient is agreeable to this plan and will call if any problems develop in the interim.   Rosalio MacadamiaLori  C. Gerhardt, RN, ANP-C [facility] [address] [place], [place]  [id number] [phone]

## 2014-09-25 ENCOUNTER — Ambulatory Visit: Payer: Medicare Other | Admitting: Cardiology

## 2014-09-25 ENCOUNTER — Telehealth: Payer: Self-pay | Admitting: Cardiology

## 2014-09-26 ENCOUNTER — Ambulatory Visit (INDEPENDENT_AMBULATORY_CARE_PROVIDER_SITE_OTHER): Payer: Medicare Other | Admitting: Cardiology

## 2014-09-26 ENCOUNTER — Encounter: Payer: Self-pay | Admitting: Cardiology

## 2014-09-26 VITALS — BP 122/70 | HR 72

## 2014-09-26 DIAGNOSIS — I2510 Atherosclerotic heart disease of native coronary artery without angina pectoris: Secondary | ICD-10-CM

## 2014-09-26 DIAGNOSIS — I2583 Coronary atherosclerosis due to lipid rich plaque: Principal | ICD-10-CM

## 2014-09-26 NOTE — Progress Notes (Signed)
HPI The patient presents for follow up of CAD.  Since I last saw him he has done well.  He did have a POET (Plain Old Exercise Treadmill) last year and he had no evidence of ischemia.  He still does quite a bit of walking skeet shooting.  He has no symptoms.  The patient denies any new symptoms such as chest discomfort, neck or arm discomfort. There has been no new shortness of breath, PND or orthopnea. There have been no reported palpitations, presyncope or syncope.  He has none of the symptoms that was his previous angina.  Allergies  Allergen Reactions  . Morphine     Current Outpatient Prescriptions  Medication Sig Dispense Refill  . aspirin 81 MG tablet Take 81 mg by mouth daily.      Marland Kitchen. atorvastatin (LIPITOR) 40 MG tablet Take 40 mg by mouth daily at 6 PM.     . loratadine (CLARITIN) 10 MG tablet Take 10 mg by mouth daily.      . mesalamine (ASACOL) 400 MG EC tablet Take 800 mg by mouth 3 (three) times daily.     . metoprolol succinate (TOPROL-XL) 50 MG 24 hr tablet TAKE 1 TABLET BY MOUTH DAILY 90 tablet 2  . multivitamin (THERAGRAN) per tablet Take 1 tablet by mouth daily.      . nitroGLYCERIN (NITROSTAT) 0.4 MG SL tablet Place 1 tablet (0.4 mg total) under the tongue every 5 (five) minutes as needed. 25 tablet 6  . ranitidine (ZANTAC) 75 MG tablet Take 75 mg by mouth daily.      . sildenafil (VIAGRA) 50 MG tablet Take 1 tablet (50 mg total) by mouth as needed for erectile dysfunction. 10 tablet 3  . Tamsulosin HCl (FLOMAX) 0.4 MG CAPS Take 0.4 mg by mouth daily.       No current facility-administered medications for this visit.    Past Medical History  Diagnosis Date  . GERD (gastroesophageal reflux disease)   . Dyslipidemia   . Benign prostatic hypertrophy   . Ulcerative colitis   . Coronary artery disease     cath 04/2007 with 90-95% RCA stenosis. Had DES placed.  Had an acute stent thrombosis in the hospital that day and subsequently had 3 DES placed in the RCA. The LAD had  45% stenosis, in the circ had 35% stenosis. The EF was well preserved.    Past Surgical History  Procedure Laterality Date  . Inguinal hernia repair  1999    ROS:  As stated in the HPI and negative for all other systems.  PHYSICAL EXAM BP 122/70 mmHg  Pulse 72 GENERAL:  Well appearing NECK:  No jugular venous distention, waveform within normal limits, carotid upstroke brisk and symmetric, no bruits, no thyromegaly LYMPHATICS:  No cervical, inguinal adenopathy LUNGS:  Clear to auscultation bilaterally BACK:  No CVA tenderness CHEST:  Unremarkable HEART:  PMI not displaced or sustained,S1 and S2 within normal limits, no S3, no S4, no clicks, no rubs, no murmurs ABD:  Flat, positive bowel sounds normal in frequency in pitch, no bruits, no rebound, no guarding, no midline pulsatile mass, no hepatomegaly, no splenomegaly EXT:  2 plus pulses throughout, no edema, no cyanosis no clubbing  EKG:  Sinus rhythm, rate  72, axis within normal limits, intervals within normal limits, no acute ST-T wave changes.  09/26/2014   ASSESSMENT AND PLAN   CAD -  The patient has no new sypmtoms.  It has been four years since his last stress  test.  I will bring the patient back for a POET (Plain Old Exercise Test). This will allow me to screen for obstructive coronary disease, risk stratify and very importantly provide a prescription for exercise.   DYSLIPIDEMIA -  I would like to get a copy of this.  He reports it was drawn recently.

## 2014-09-26 NOTE — Patient Instructions (Signed)
Your physician recommends that you schedule a follow-up appointment in: one year with Dr. Hochrein  

## 2014-09-27 NOTE — Telephone Encounter (Signed)
Closed encounter °

## 2015-02-26 ENCOUNTER — Encounter: Payer: Self-pay | Admitting: Cardiology

## 2015-09-23 NOTE — Progress Notes (Signed)
HPI The patient presents for one year follow up of CAD.  Since I last saw him he has had no hospital visits or ED visits .  The patient denies any new symptoms such as chest discomfort, neck or arm discomfort. There has been no new shortness of breath, PND or orthopnea. There have been no reported palpitations, presyncope or syncope.  He did have a POET (Plain Old Exercise Treadmill) in 2015 and he had no evidence of ischemia.  He still does quite a bit of walking skeet shooting.     Allergies  Allergen Reactions  . Morphine     Hot, sweats, flushed    Current Outpatient Prescriptions  Medication Sig Dispense Refill  . ASACOL HD 800 MG TBEC Take 1 tablet by mouth 3 (three) times daily.  3  . aspirin 81 MG tablet Take 81 mg by mouth daily.      Marland Kitchen atorvastatin (LIPITOR) 40 MG tablet Take 40 mg by mouth daily at 6 PM.     . loratadine (CLARITIN) 10 MG tablet Take 10 mg by mouth daily.      . metoprolol succinate (TOPROL-XL) 50 MG 24 hr tablet TAKE 1 TABLET BY MOUTH DAILY 90 tablet 2  . multivitamin (THERAGRAN) per tablet Take 1 tablet by mouth daily.      . nitroGLYCERIN (NITROSTAT) 0.4 MG SL tablet Place 1 tablet (0.4 mg total) under the tongue every 5 (five) minutes as needed. 25 tablet 6  . ranitidine (ZANTAC) 75 MG tablet Take 75 mg by mouth daily.      . sildenafil (VIAGRA) 50 MG tablet Take 1 tablet (50 mg total) by mouth as needed for erectile dysfunction. 10 tablet 3  . Tamsulosin HCl (FLOMAX) 0.4 MG CAPS Take 0.4 mg by mouth daily.       No current facility-administered medications for this visit.    Past Medical History  Diagnosis Date  . GERD (gastroesophageal reflux disease)   . Dyslipidemia   . Benign prostatic hypertrophy   . Ulcerative colitis   . Coronary artery disease     cath 04/2007 with 90-95% RCA stenosis. Had DES placed.  Had an acute stent thrombosis in the hospital that day and subsequently had 3 DES placed in the RCA. The LAD had 45% stenosis, in the circ had  35% stenosis. The EF was well preserved.    Past Surgical History  Procedure Laterality Date  . Inguinal hernia repair  1999    ROS:  ED.  Otherwise as stated in the HPI and negative for all other systems.  PHYSICAL EXAM BP 130/80 mmHg  Pulse 59  Ht  (1.854 m)  Wt 223 lb 4.8 oz (101.288 kg)  BMI 29.47 kg/m2 GENERAL:  Well appearing NECK:  No jugular venous distention, waveform within normal limits, carotid upstroke brisk and symmetric, no bruits, no thyromegaly LYMPHATICS:  No cervical, inguinal adenopathy LUNGS:  Clear to auscultation bilaterally BACK:  No CVA tenderness CHEST:  Unremarkable HEART:  PMI not displaced or sustained,S1 and S2 within normal limits, no S3, no S4, no clicks, no rubs, no murmurs ABD:  Flat, positive bowel sounds normal in frequency in pitch, no bruits, no rebound, no guarding, no midline pulsatile mass, no hepatomegaly, no splenomegaly EXT:  2 plus pulses throughout, no edema, no cyanosis no clubbing  EKG:  Sinus rhythm, rate  59, axis within normal limits, intervals within normal limits, no acute ST-T wave changes.  09/24/2015   ASSESSMENT AND PLAN  CAD -  The patient has no new sypmtoms.  No further cardiovascular testing is indicated.  We will continue with aggressive risk reduction and meds as listed.   DYSLIPIDEMIA -  This is followed by [name], MD.  His last LDL was 82 and HDL 34.  I will review the most recent labs when available.

## 2015-09-24 ENCOUNTER — Encounter: Payer: Self-pay | Admitting: Cardiology

## 2015-09-24 ENCOUNTER — Ambulatory Visit (INDEPENDENT_AMBULATORY_CARE_PROVIDER_SITE_OTHER): Payer: Medicare Other | Admitting: Cardiology

## 2015-09-24 VITALS — BP 130/80 | HR 59 | Ht 73.0 in | Wt 223.3 lb

## 2015-09-24 DIAGNOSIS — I2510 Atherosclerotic heart disease of native coronary artery without angina pectoris: Secondary | ICD-10-CM

## 2015-09-24 DIAGNOSIS — I2583 Coronary atherosclerosis due to lipid rich plaque: Principal | ICD-10-CM

## 2015-09-24 NOTE — Patient Instructions (Signed)
Dr Hochrein has made no changes today in your current medications or treatment plan.  Your physician recommends that you schedule a follow-up appointment in 1 year. You will receive a reminder letter in the mail two months in advance. If you don't receive a letter, please call our office to schedule the follow-up appointment.  If you need a refill on your cardiac medications before your next appointment, please call your pharmacy. 

## 2016-09-25 ENCOUNTER — Ambulatory Visit (INDEPENDENT_AMBULATORY_CARE_PROVIDER_SITE_OTHER): Payer: Medicare Other | Admitting: Cardiology

## 2016-09-25 ENCOUNTER — Telehealth: Payer: Self-pay | Admitting: Cardiology

## 2016-09-25 ENCOUNTER — Encounter: Payer: Self-pay | Admitting: Cardiology

## 2016-09-25 VITALS — BP 139/78 | HR 61 | Ht 73.0 in | Wt 226.8 lb

## 2016-09-25 DIAGNOSIS — N529 Male erectile dysfunction, unspecified: Secondary | ICD-10-CM | POA: Diagnosis not present

## 2016-09-25 DIAGNOSIS — I2510 Atherosclerotic heart disease of native coronary artery without angina pectoris: Secondary | ICD-10-CM | POA: Diagnosis not present

## 2016-09-25 DIAGNOSIS — E785 Hyperlipidemia, unspecified: Secondary | ICD-10-CM | POA: Diagnosis not present

## 2016-09-25 MED ORDER — METOPROLOL SUCCINATE ER 50 MG PO TB24: 50.0000 mg | ORAL_TABLET | Freq: Every day | ORAL | 3 refills | Status: DC

## 2016-09-25 MED ORDER — SILDENAFIL CITRATE 50 MG PO TABS: 50.0000 mg | ORAL_TABLET | ORAL | 3 refills | Status: DC | PRN

## 2016-09-25 MED ORDER — NITROGLYCERIN 0.4 MG SL SUBL: 0.4000 mg | SUBLINGUAL_TABLET | SUBLINGUAL | 3 refills | Status: DC | PRN

## 2016-09-25 NOTE — Patient Instructions (Signed)

## 2016-09-25 NOTE — Telephone Encounter (Signed)
New Message    Pharmacy called regarding  Prescriptions below. Says one needs to be cancelled. Requesting a call back  sildenafil (VIAGRA) 50 MG tablet    nitroGLYCERIN (NITROSTAT) 0.4 MG SL tablet

## 2016-09-25 NOTE — Progress Notes (Signed)
HPI The patient presents for one year follow up of CAD. He did have a POET (Plain Old Exercise Treadmill) in 2015 and he had no evidence of ischemia.  Since I last saw him he has done well.  The patient denies any new symptoms such as chest discomfort, neck or arm discomfort. There has been no new shortness of breath, PND or orthopnea. There have been no reported palpitations, presyncope or syncope.  He walks a mile daily.    Allergies  Allergen Reactions  . Morphine     Hot, sweats, flushed    Current Outpatient Prescriptions  Medication Sig Dispense Refill  . ASACOL HD 800 MG TBEC Take 1 tablet by mouth 3 (three) times daily.  3  . aspirin 81 MG tablet Take 81 mg by mouth daily.      Marland Kitchen atorvastatin (LIPITOR) 40 MG tablet Take 40 mg by mouth daily at 6 PM.     . loratadine (CLARITIN) 10 MG tablet Take 10 mg by mouth daily.      . metoprolol succinate (TOPROL-XL) 50 MG 24 hr tablet Take 1 tablet (50 mg total) by mouth daily. Take with or immediately following a meal. 90 tablet 3  . multivitamin (THERAGRAN) per tablet Take 1 tablet by mouth daily.      . ranitidine (ZANTAC) 75 MG tablet Take 75 mg by mouth daily.      . sildenafil (VIAGRA) 50 MG tablet Take 1 tablet (50 mg total) by mouth as needed for erectile dysfunction. 10 tablet 3  . Tamsulosin HCl (FLOMAX) 0.4 MG CAPS Take 0.4 mg by mouth daily.      . nitroGLYCERIN (NITROSTAT) 0.4 MG SL tablet Place 1 tablet (0.4 mg total) under the tongue every 5 (five) minutes as needed for chest pain. 25 tablet 3   No current facility-administered medications for this visit.     Past Medical History:  Diagnosis Date  . Benign prostatic hypertrophy   . Coronary artery disease    cath 04/2007 with 90-95% RCA stenosis. Had DES placed.  Had an acute stent thrombosis in the hospital that day and subsequently had 3 DES placed in the RCA. The LAD had 45% stenosis, in the circ had 35% stenosis. The EF was well preserved.  . Dyslipidemia   . GERD  (gastroesophageal reflux disease)   . Ulcerative colitis     Past Surgical History:  Procedure Laterality Date  . INGUINAL HERNIA REPAIR  1999    ROS:  ED.  Otherwise as stated in the HPI and negative for all other systems.  PHYSICAL EXAM BP 139/78   Pulse 61   Ht 6\' 1"  (1.854 m)   Wt 226 lb 12.8 oz (102.9 kg)   BMI 29.92 kg/m  GENERAL:  Well appearing NECK:  No jugular venous distention, waveform within normal limits, carotid upstroke brisk and symmetric, no bruits, no thyromegaly LUNGS:  Clear to auscultation bilaterally BACK:  No CVA tenderness CHEST:  Unremarkable HEART:  PMI not displaced or sustained,S1 and S2 within normal limits, no S3, no S4, no clicks, no rubs, no murmurs ABD:  Flat, positive bowel sounds normal in frequency in pitch, no bruits, no rebound, no guarding, no midline pulsatile mass, no hepatomegaly, no splenomegaly EXT:  2 plus pulses throughout, no edema, no cyanosis no clubbing  EKG:  Sinus rhythm, rate  61, axis within normal limits, intervals within normal limits, no acute ST-T wave changes.  09/26/2016   ASSESSMENT AND PLAN  CAD -  The patient has no new sypmtoms.  No further cardiovascular testing is indicated.  We will continue with aggressive risk reduction and meds as listed.    Of note I did renew his prescription for Viagra. He understands he cannot use this if he's using sublingual nitroglycerin. He has not ever used the nitroglycerin.   DYSLIPIDEMIA -  This is followed by Judge StallBAKER,CLIFTON, MD.  He does not know the most recent readings but was told it was OK.  We discussed diet and exercise.  I will defer to his PCP.

## 2016-09-26 ENCOUNTER — Encounter: Payer: Self-pay | Admitting: Cardiology

## 2016-09-26 NOTE — Telephone Encounter (Signed)
Spoke with pt, he voiced understanding and knows he can not take NTG within 24 hours of taking Viagra. Spoke with the pharmacy and gave the okay for the patient to continue both prescriptions.

## 2016-10-06 HISTORY — PX: COLONOSCOPY: SHX174

## 2017-01-29 ENCOUNTER — Telehealth: Payer: Self-pay | Admitting: Cardiology

## 2017-01-29 MED ORDER — METOPROLOL SUCCINATE ER 50 MG PO TB24: 50.0000 mg | ORAL_TABLET | Freq: Every day | ORAL | 1 refills | Status: DC

## 2017-01-29 NOTE — Telephone Encounter (Signed)
New message      *STAT* If patient is at the pharmacy, call can be transferred to refill team.   1. Which medications need to be refilled? (please list name of each medication and dose if known)  metoprolol succinate (TOPROL-XL) 50 MG 24 hr tablet Take 1 tablet (50 mg total) by mouth daily. Take with or immediately following a meal.     2. Which pharmacy/location (including street and city if local pharmacy) is medication to be sent to? [facility]. Do they need a 30 day or 90 day supply? 90    Needs filled a couple days early pt is going on vacation

## 2017-09-14 ENCOUNTER — Encounter: Payer: Self-pay | Admitting: Student

## 2017-09-14 ENCOUNTER — Ambulatory Visit: Payer: Medicare Other | Admitting: Physician Assistant

## 2017-09-14 VITALS — BP 130/76 | HR 70 | Ht 73.0 in | Wt 230.8 lb

## 2017-09-14 DIAGNOSIS — E785 Hyperlipidemia, unspecified: Secondary | ICD-10-CM

## 2017-09-14 DIAGNOSIS — I2510 Atherosclerotic heart disease of native coronary artery without angina pectoris: Secondary | ICD-10-CM

## 2017-09-14 DIAGNOSIS — Z01818 Encounter for other preprocedural examination: Secondary | ICD-10-CM

## 2017-09-14 MED ORDER — METOPROLOL SUCCINATE ER 50 MG PO TB24: 50.0000 mg | ORAL_TABLET | Freq: Every day | ORAL | 3 refills | Status: DC

## 2017-09-14 MED ORDER — ATORVASTATIN CALCIUM 40 MG PO TABS: 40.0000 mg | ORAL_TABLET | Freq: Every day | ORAL | 3 refills | Status: DC

## 2017-09-14 MED ORDER — NITROGLYCERIN 0.4 MG SL SUBL: 0.4000 mg | SUBLINGUAL_TABLET | SUBLINGUAL | 5 refills | Status: DC | PRN

## 2017-09-14 NOTE — Patient Instructions (Signed)
NO MEDICATION CHANGES  MEDICATION REFILLED AS REQUESTED.   CARDIAC CLEARANCE FOR LEFT SHOULDER SURGERY     Your physician wants you to follow-up in 812 MONTH [facility]. You will receive a reminder letter in the mail two months in advance. If you don't receive a letter, please call our office to schedule the follow-up appointment.    If you need a refill on your cardiac medications before your next appointment, please call your pharmacy.

## 2017-09-14 NOTE — Progress Notes (Signed)
Cardiology Office Note   Date:  09/14/2017   ID:  Martin AsaSteve C Claiborne, DOB [birth year], [name] [id number]  PCP:  Judge StallBaker, Clifton, MD  Cardiologist: Dr. Antoine PocheHochrein, 09/25/2016 Theodore Demarkhonda Barrett, PA-C   Chief Complaint  Patient presents with  . Pre-op Exam    left shoulder    History of Present Illness: Martin [name] is a 72 y.o. male with a history of DES RCA 2008, POET 2015 w/ no ischemia, GERD, HLD (PCP), UC  Martin AsaSteve C Ransford presents for cardiology follow up and evaluation.  Mr Elvin Soearce generally does very well. He denies any CP w/ exertion. Has some DOE, when walking up hills, but can easily go up a flight of stairs without stopping.  He does not feel the dyspnea on exertion has changed recently.  He denies orthopnea or PND.  He does not have palpitations, no presyncope or syncope.  He has some daytime lower extremity edema, but does not wake with it.  Most of the edema is below the sock line, he wears socks that fit very tightly.  He needs shoulder surgery.  Because of the shoulder surgery, his activity level has decreased some as far as lifting and carrying things.  However, he does not feel limited in his ability to walk or climb stairs by his breathing or any cardiac issues.  He needs refills on his cardiac medications, but not the Viagra.   Past Medical History:  Diagnosis Date  . Benign prostatic hypertrophy   . Coronary artery disease    cath 04/2007 with 90-95% RCA stenosis. Had DES placed.  Had an acute stent thrombosis in the hospital that day and subsequently had 3 DES placed in the RCA. The LAD had 45% stenosis, in the circ had 35% stenosis. The EF was well preserved.  . Dyslipidemia   . GERD (gastroesophageal reflux disease)   . Ulcerative colitis     Past Surgical History:  Procedure Laterality Date  . INGUINAL HERNIA REPAIR  1999    Current Outpatient Medications  Medication Sig Dispense Refill  . ASACOL HD 800 MG TBEC Take 1 tablet by mouth 3 (three) times daily.   3  . aspirin 81 MG tablet Take 81 mg by mouth daily.      Marland Kitchen. atorvastatin (LIPITOR) 40 MG tablet Take 40 mg by mouth daily at 6 PM.     . loratadine (CLARITIN) 10 MG tablet Take 10 mg by mouth daily.      . metoprolol succinate (TOPROL-XL) 50 MG 24 hr tablet Take 1 tablet (50 mg total) by mouth daily. Take with or immediately following a meal. 90 tablet 1  . multivitamin (THERAGRAN) per tablet Take 1 tablet by mouth daily.      . ranitidine (ZANTAC) 75 MG tablet Take 75 mg by mouth daily.      . sildenafil (VIAGRA) 50 MG tablet Take 1 tablet (50 mg total) by mouth as needed for erectile dysfunction. 10 tablet 3  . Tamsulosin HCl (FLOMAX) 0.4 MG CAPS Take 0.4 mg by mouth daily.      . nitroGLYCERIN (NITROSTAT) 0.4 MG SL tablet Place 1 tablet (0.4 mg total) under the tongue every 5 (five) minutes as needed for chest pain. 25 tablet 3   No current facility-administered medications for this visit.     Allergies:   Morphine    Social History:  The patient  reports that  has never smoked. he has never used smokeless tobacco. He reports that he does not  drink alcohol.   Family History:  The patient's family history includes Heart attack in his brother, father, and mother.    ROS:  Please see the history of present illness. All other systems are reviewed and negative.    PHYSICAL EXAM: VS:  BP 130/76   Pulse 70   Ht 6\' 1"  (1.854 m)   Wt 230 lb 12.8 oz (104.7 kg)   BMI 30.45 kg/m  , BMI Body mass index is 30.45 kg/m. GEN: Well nourished, well developed, male in no acute distress  HEENT: normal for age  Neck: no JVD, no carotid bruit, no masses Cardiac: RRR; no murmur, no rubs, or gallops Respiratory:  clear to auscultation bilaterally, normal work of breathing GI: soft, nontender, nondistended, + BS MS: no deformity or atrophy; trace lower extremity edema below the sock line; distal pulses are 2+ in all 4 extremities   Skin: warm and dry, no rash Neuro:  Strength and sensation are  intact Psych: euthymic mood, full affect   EKG:  EKG is ordered today. The ekg ordered today demonstrates sinus rhythm, normal intervals, no acute ischemic changes and no Q waves noted   Recent Labs: No results found for requested labs within last 8760 hours.    Lipid Panel    Component Value Date/Time   CHOL 161 08/14/2009 0743   TRIG 158.0 (H) 08/14/2009 0743   HDL 38.90 (L) 08/14/2009 0743   CHOLHDL 4 08/14/2009 0743   VLDL 31.6 08/14/2009 0743   LDLCALC 91 08/14/2009 0743     Wt Readings from Last 3 Encounters:  09/14/17 230 lb 12.8 oz (104.7 kg)  09/25/16 226 lb 12.8 oz (102.9 kg)  09/24/15 223 lb 4.8 oz (101.3 kg)     Other studies Reviewed: Additional studies/ records that were reviewed today include: Office notes, hospital records and testing.  ASSESSMENT AND PLAN:  1.  Preoperative evaluation: His RCRI score is 0.9% for major cardiac events.  According to the Duke activity index, his functional capacity admits is 8.97.  He has been revascularized in his last stress test was without any abnormality.  He is at acceptable risk for the planned procedure without further cardiac workup.  2.  CAD: He is on ASA, statin, beta-blocker and sublingual nitroglycerin.  We will refill these medications.  3. ED: Patient states he does not need a refill on the Viagra.  4.  Hyperlipidemia, LDL goal less than 70: On his labs from 2016, his LDL was 88.  His PCP follows this, requested a copy of his labs for our records.   Current medicines are reviewed at length with the patient today.  The patient does not have concerns regarding medicines.  The following changes have been made:  no change  Labs/ tests ordered today include:  No orders of the defined types were placed in this encounter.    Disposition:   FU with Dr. Antoine Poche in 1 year  Signed, Theodore Demark, PA-C  09/14/2017 8:42 AM    [facility] Phone: [phone]; Fax: [phone]    This note was written with the assistance of speech recognition software. Please excuse any transcriptional errors.

## 2017-09-24 ENCOUNTER — Telehealth: Payer: Self-pay | Admitting: Physician Assistant

## 2017-09-24 NOTE — Telephone Encounter (Signed)
EKG faxed to # requested.

## 2017-09-24 NOTE — Telephone Encounter (Signed)
Pt was cleared for surgery,but she need a copy of his last EKG. Please fax to [phone] UJW:[id number]:[facility] Team

## 2017-12-17 DIAGNOSIS — I2699 Other pulmonary embolism without acute cor pulmonale: Secondary | ICD-10-CM

## 2017-12-17 DIAGNOSIS — I2510 Atherosclerotic heart disease of native coronary artery without angina pectoris: Secondary | ICD-10-CM | POA: Diagnosis not present

## 2017-12-18 ENCOUNTER — Telehealth: Payer: Self-pay | Admitting: Cardiology

## 2017-12-18 DIAGNOSIS — I2510 Atherosclerotic heart disease of native coronary artery without angina pectoris: Secondary | ICD-10-CM | POA: Diagnosis not present

## 2017-12-18 DIAGNOSIS — I2699 Other pulmonary embolism without acute cor pulmonale: Secondary | ICD-10-CM | POA: Diagnosis not present

## 2017-12-18 NOTE — Telephone Encounter (Signed)
Spoke with Dr. Rito EhrlichKrishnan. Patient hospitalized with PE. To be discharged on Xarelto. OK to stop ASA at this time.

## 2018-09-01 DIAGNOSIS — C801 Malignant (primary) neoplasm, unspecified: Secondary | ICD-10-CM

## 2018-09-01 HISTORY — DX: Malignant (primary) neoplasm, unspecified: C80.1

## 2018-09-13 NOTE — Progress Notes (Signed)
HPI The patient presents for one year follow up of CAD. He did have a POET (Plain Old Exercise Treadmill) in 2015 and he had no evidence of ischemia.   We saw him preoperatively for shoulder surgery.  He had a very nice response with his surgery.  However, he did have pulmonary embolism afterward diagnosed when he had some pleuritic pain and shortness of breath.  He was treated with 6 months of Xarelto and his aspirin was held.  He is now back on his aspirin.  He is been very active. .The patient denies any new symptoms such as chest discomfort, neck or arm discomfort. There has been no new shortness of breath, PND or orthopnea. There have been no reported palpitations, presyncope or syncope.   Allergies  Allergen Reactions  . Morphine     Hot, sweats, flushed    Current Outpatient Medications  Medication Sig Dispense Refill  . ASACOL HD 800 MG TBEC Take 1 tablet by mouth 3 (three) times daily.  3  . aspirin 81 MG tablet Take 81 mg by mouth daily.      Marland Kitchen atorvastatin (LIPITOR) 40 MG tablet Take 1 tablet (40 mg total) by mouth daily at 6 PM. 90 tablet 3  . metoprolol succinate (TOPROL-XL) 50 MG 24 hr tablet Take 1 tablet (50 mg total) by mouth daily. Take with or immediately following a meal. 90 tablet 3  . multivitamin (THERAGRAN) per tablet Take 1 tablet by mouth daily.      . nitroGLYCERIN (NITROSTAT) 0.4 MG SL tablet Place 1 tablet (0.4 mg total) under the tongue every 5 (five) minutes as needed for chest pain. 25 tablet 5  . ranitidine (ZANTAC) 75 MG tablet Take 75 mg by mouth daily.      . sildenafil (VIAGRA) 50 MG tablet Take 1 tablet (50 mg total) by mouth as needed for erectile dysfunction. 10 tablet 3  . Tamsulosin HCl (FLOMAX) 0.4 MG CAPS Take 0.4 mg by mouth daily.       No current facility-administered medications for this visit.     Past Medical History:  Diagnosis Date  . Benign prostatic hypertrophy   . Coronary artery disease    cath 04/2007 with 90-95% RCA  stenosis. Had DES placed.  Had an acute stent thrombosis in the hospital that day and subsequently had 3 DES placed in the RCA. The LAD had 45% stenosis, in the circ had 35% stenosis. The EF was well preserved.  . Dyslipidemia   . GERD (gastroesophageal reflux disease)   . Ulcerative colitis     Past Surgical History:  Procedure Laterality Date  . INGUINAL HERNIA REPAIR  1999  . ROTATOR CUFF REPAIR Left     ROS:   As stated in the HPI and negative for all other systems.  PHYSICAL EXAM BP 136/70   Pulse 62   Ht 6\' 1"  (1.854 m)   Wt 229 lb 6.4 oz (104.1 kg)   SpO2 94%   BMI 30.27 kg/m  GENERAL:  Well appearing NECK:  No jugular venous distention, waveform within normal limits, carotid upstroke brisk and symmetric, positive bruits, no thyromegaly LUNGS:  Clear to auscultation bilaterally CHEST:  Unremarkable HEART:  PMI not displaced or sustained,S1 and S2 within normal limits, no S3, no S4, no clicks, no rubs, no murmurs ABD:  Flat, positive bowel sounds normal in frequency in pitch, no bruits, no rebound, no guarding, no midline pulsatile mass, no hepatomegaly, no splenomegaly EXT:  2  plus pulses throughout, trace edema, no cyanosis no clubbing   EKG:  Sinus rhythm, rate  62, axis within normal limits, intervals within normal limits, no acute ST-T wave changes.  09/15/2018    ASSESSMENT AND PLAN   CAD -  The patient has no new sypmtoms.  No further cardiovascular testing is indicated.  We will continue with aggressive risk reduction and meds as listed.   DYSLIPIDEMIA -  He is going to have these results sent to me.  Goal LDL is less than 70.   BRUITS - He will need carotid Dopplers.

## 2018-09-15 ENCOUNTER — Ambulatory Visit: Payer: Medicare Other | Admitting: Cardiology

## 2018-09-15 ENCOUNTER — Encounter: Payer: Self-pay | Admitting: Cardiology

## 2018-09-15 VITALS — BP 136/70 | HR 62 | Ht 73.0 in | Wt 229.4 lb

## 2018-09-15 DIAGNOSIS — N529 Male erectile dysfunction, unspecified: Secondary | ICD-10-CM | POA: Diagnosis not present

## 2018-09-15 DIAGNOSIS — R0989 Other specified symptoms and signs involving the circulatory and respiratory systems: Secondary | ICD-10-CM | POA: Diagnosis not present

## 2018-09-15 DIAGNOSIS — E785 Hyperlipidemia, unspecified: Secondary | ICD-10-CM

## 2018-09-15 DIAGNOSIS — I2510 Atherosclerotic heart disease of native coronary artery without angina pectoris: Secondary | ICD-10-CM | POA: Diagnosis not present

## 2018-09-15 MED ORDER — METOPROLOL SUCCINATE ER 50 MG PO TB24: 50.0000 mg | ORAL_TABLET | Freq: Every day | ORAL | 3 refills | Status: DC

## 2018-09-15 MED ORDER — SILDENAFIL CITRATE 50 MG PO TABS: 50.0000 mg | ORAL_TABLET | ORAL | 3 refills | Status: DC | PRN

## 2018-09-15 NOTE — Patient Instructions (Addendum)
Medication Instructions:  Continue current medications  If you need a refill on your cardiac medications before your next appointment, please call your pharmacy.  Labwork: None Ordered   Take the provided lab slips with you to the lab for your blood draw.  When you have your labs (blood work) drawn today and your tests are completely normal, you will receive your results only by MyChart Message (if you have MyChart) -OR-  A paper copy in the mail.  If you have any lab test that is abnormal or we need to change your treatment, we will call you to review these results.  Testing/Procedures: Your physician has requested that you have a carotid duplex. This test is an ultrasound of the carotid arteries in your neck. It looks at blood flow through these arteries that supply the brain with blood. Allow one hour for this exam. There are no restrictions or special instructions.  Follow-Up: You will need a follow up appointment in 1 Year.  Please call our office 2 months in advance to schedule this appointment.  You may see Dr Antoine Poche or one of the following Advanced Practice Providers on your designated Care Team:   Theodore Demark, PA-C Joni Reining, DNP, ANP    At [facility], you and your health needs are our priority.  As part of our continuing mission to provide you with exceptional heart care, we have created designated Provider Care Teams.  These Care Teams include your primary Cardiologist (physician) and Advanced Practice Providers (APPs -  Physician Assistants and Nurse Practitioners) who all work together to provide you with the care you need, when you need it.  Thank you for choosing [facility] at [facility]!!

## 2018-09-22 ENCOUNTER — Ambulatory Visit (HOSPITAL_COMMUNITY)
Admission: RE | Admit: 2018-09-22 | Discharge: 2018-09-22 | Disposition: A | Discharge status: Home / Self Care | Payer: Medicare Other | Source: Ambulatory Visit | Attending: Cardiology | Admitting: Cardiology

## 2018-09-22 DIAGNOSIS — R0989 Other specified symptoms and signs involving the circulatory and respiratory systems: Secondary | ICD-10-CM | POA: Diagnosis present

## 2018-10-01 ENCOUNTER — Encounter: Payer: Self-pay | Admitting: *Deleted

## 2019-01-11 ENCOUNTER — Other Ambulatory Visit (HOSPITAL_COMMUNITY): Payer: Self-pay | Admitting: Cardiology

## 2019-01-11 DIAGNOSIS — I6523 Occlusion and stenosis of bilateral carotid arteries: Secondary | ICD-10-CM

## 2019-03-15 ENCOUNTER — Other Ambulatory Visit: Payer: Self-pay | Admitting: Physician Assistant

## 2019-07-07 DIAGNOSIS — I6522 Occlusion and stenosis of left carotid artery: Secondary | ICD-10-CM | POA: Insufficient documentation

## 2019-07-07 DIAGNOSIS — E785 Hyperlipidemia, unspecified: Secondary | ICD-10-CM | POA: Insufficient documentation

## 2019-07-07 NOTE — Progress Notes (Signed)
Virtual Visit via Telephone Note   This visit type was conducted due to national recommendations for restrictions regarding the COVID-19 Pandemic (e.g. social distancing) in an effort to limit this patient's exposure and mitigate transmission in our community.  Due to his co-morbid illnesses, this patient is at least at moderate risk for complications without adequate follow up.  This format is felt to be most appropriate for this patient at this time.  The patient did not have access to video technology/had technical difficulties with video requiring transitioning to audio format only (telephone).  All issues noted in this document were discussed and addressed.  No physical exam could be performed with this format.  Please refer to the patient's chart for his  consent to telehealth for [facility].   Date:  07/08/2019   ID:  Martin [name], DOB 05-01-1946, [name] [id number]  Patient Location: Home Provider Location: Home  PCP:  Martin Stall, MD  Cardiologist:  Martin Rotunda, MD  Electrophysiologist:  None   Evaluation Performed:  Follow-Up Visit  Chief Complaint:  CAD  History of Present Illness:    Martin [name] is a 73 y.o. male who presents for one year follow up of CAD. He did have a POET (Plain Old Exercise Treadmill) in 2015 and he had no evidence of ischemia.   We saw him preoperatively for shoulder surgery.  He had a very nice response with his surgery.  However, he did have pulmonary embolism afterward diagnosed when he had some pleuritic pain and shortness of breath.  He was treated with 6 months of Xarelto and his aspirin was held.    Over the last 10 days or so he has had some chest discomfort mildly and shortness of breath when climbing a flight of stairs.  This is not happening with every activity.  It is sporadic and mild.  It does not happen every time he goes up the stairs.  He recovers very quickly.  It is not like his previous angina.  He is not having any resting  chest pressure, neck or arm discomfort.  He is not having any PND or orthopnea.  He has no palpitations, presyncope or syncope.  He has had no weight gain or edema.  [place] remains [place].  He was diagnosed with prostate cancer might be having a TURP  The patient does not have symptoms concerning for COVID-19 infection (fever, chills, cough, or new shortness of breath).    Past Medical History:  Diagnosis Date  . Benign prostatic hypertrophy   . Coronary artery disease    cath 04/2007 with 90-95% RCA stenosis. Had DES placed.  Had an acute stent thrombosis in the hospital that day and subsequently had 3 DES placed in the RCA. The LAD had 45% stenosis, in the circ had 35% stenosis. The EF was well preserved.  . Dyslipidemia   . GERD (gastroesophageal reflux disease)   . Ulcerative colitis    Past Surgical History:  Procedure Laterality Date  . INGUINAL HERNIA REPAIR  1999  . ROTATOR CUFF REPAIR Left      Current Meds  Medication Sig  . ASACOL HD 800 MG TBEC Take 1 tablet by mouth 3 (three) times daily.  Marland Kitchen aspirin 81 MG tablet Take 81 mg by mouth daily.    Marland Kitchen atorvastatin (LIPITOR) 40 MG tablet Take 1 tablet (40 mg total) by mouth daily at 6 PM.  . loratadine (CLARITIN) 10 MG tablet Take 10 mg by  mouth daily.  . meloxicam (MOBIC) 7.5 MG tablet Take 7.5 mg by mouth daily.  . metoprolol succinate (TOPROL-XL) 50 MG 24 hr tablet Take 1 tablet (50 mg total) by mouth daily. Take with or immediately following a meal.  . Tamsulosin HCl (FLOMAX) 0.4 MG CAPS Take 0.4 mg by mouth daily.       Allergies:   Morphine    ROS:   Please see the history of present illness.     All other systems reviewed and are negative.   Prior CV studies:   The following studies were reviewed today:  None  Labs/Other Tests and Data Reviewed:    EKG:  Previous ECG reviewed and there is no acute changes that would preclude stress testing.  Recent Labs: No results found for requested labs  within last 8760 hours.   Recent Lipid Panel Lab Results  Component Value Date/Time   CHOL 161 08/14/2009 07:43 AM   TRIG 158.0 (H) 08/14/2009 07:43 AM   HDL 38.90 (L) 08/14/2009 07:43 AM   CHOLHDL 4 08/14/2009 07:43 AM   LDLCALC 91 08/14/2009 07:43 AM    Wt Readings from Last 3 Encounters:  07/08/19 230 lb (104.3 kg)  09/15/18 229 lb 6.4 oz (104.1 kg)  09/14/17 230 lb 12.8 oz (104.7 kg)     Objective:    Vital Signs:  BP (!) 152/70   Pulse 62   Ht 6\' 1"  (1.854 m)   Wt 230 lb (104.3 kg)   SpO2 96%   BMI 30.34 kg/m    VITAL SIGNS:  reviewed  ASSESSMENT & PLAN:    CAD -  The patient is having some typical and some atypical symptoms.  Not particularly high risk although they are new onset.  I think screening him with an exercise treadmill test would be most prudent.  I talked to him at length about this and he will come back to have this done.  Further testing will be based on these results.   DYSLIPIDEMIA -  We will going to call and get a copy of his lipid profile from his primary provider.    HTN - Blood pressure is at target typically and he is getting keep a blood pressure diary as this reading is unusual.  PREOP - Preop clearance would be based on the results of the POET (Plain Old Exercise Treadmill)  CAROTID- He had moderate left stenosis in Jan of this year.  He will need follow up in one year.   COVID-19 Education: The signs and symptoms of COVID-19 were discussed with the patient and how to seek care for testing (follow up with PCP or arrange E-visit).  The importance of social distancing was discussed today.  Time:   Today, I have spent 16 minutes with the patient with telehealth technology discussing the above problems.     Medication Adjustments/Labs and Tests Ordered: Current medicines are reviewed at length with the patient today.  Concerns regarding medicines are outlined above.   Tests Ordered: Orders Placed This Encounter  Procedures  .  EXERCISE TOLERANCE TEST (ETT)    Medication Changes: No orders of the defined types were placed in this encounter.   Follow Up:  In Person 3 - 4 months  Signed, Minus Breeding, MD  07/08/2019 11:01 AM    [place]

## 2019-07-08 ENCOUNTER — Telehealth (INDEPENDENT_AMBULATORY_CARE_PROVIDER_SITE_OTHER): Payer: Medicare Other | Admitting: Cardiology

## 2019-07-08 ENCOUNTER — Telehealth: Payer: Self-pay

## 2019-07-08 ENCOUNTER — Encounter: Payer: Self-pay | Admitting: Cardiology

## 2019-07-08 VITALS — BP 152/70 | HR 62 | Ht 73.0 in | Wt 230.0 lb

## 2019-07-08 DIAGNOSIS — R0602 Shortness of breath: Secondary | ICD-10-CM

## 2019-07-08 DIAGNOSIS — I6522 Occlusion and stenosis of left carotid artery: Secondary | ICD-10-CM

## 2019-07-08 DIAGNOSIS — I2510 Atherosclerotic heart disease of native coronary artery without angina pectoris: Secondary | ICD-10-CM | POA: Diagnosis not present

## 2019-07-08 DIAGNOSIS — E785 Hyperlipidemia, unspecified: Secondary | ICD-10-CM

## 2019-07-08 NOTE — Patient Instructions (Addendum)
Medication Instructions:  KEEP METOPROLOL DOSE @ 50MG  DAILY  HOLD METOPROLOL 2 DAYS PRIOR TO TREADMILL TEST.   If you need a refill on your cardiac medications before your next appointment, please call your pharmacy.   Lab work: NONE  Testing/Procedures: Your physician has requested that you have an exercise tolerance test. For further information please visit [email or web address]. Please also follow instruction sheet, as given. DO NOT TAKE METOPROLOL @ DAYS PRIOR TO TEST.  Follow-Up: Your physician wants you to follow-up in: 4 months. You will receive a reminder letter in the mail two months in advance. If you don't receive a letter, please call our office to schedule the follow-up appointment.  Any Other Special Instructions Will Be Listed Below (If Applicable). Keep a BP diary. Take 2 times daily. Once in the AM and once in the PM.   You have to have a Covid test 3 days prior to test.

## 2019-07-08 NOTE — Telephone Encounter (Signed)
Called for virtual visit - no answer. [id number].

## 2019-07-11 ENCOUNTER — Encounter (HOSPITAL_COMMUNITY): Payer: Self-pay | Admitting: Cardiology

## 2019-07-15 ENCOUNTER — Telehealth: Payer: Self-pay | Admitting: Cardiology

## 2019-07-15 NOTE — Telephone Encounter (Signed)
New Message  Patient is calling in to schedule ETT. Please give a call back at [phone] to schedule.

## 2019-08-09 ENCOUNTER — Other Ambulatory Visit (HOSPITAL_COMMUNITY)
Admission: RE | Admit: 2019-08-09 | Discharge: 2019-08-09 | Disposition: A | Discharge status: Home / Self Care | Payer: Medicare Other | Source: Ambulatory Visit | Attending: Cardiology | Admitting: Cardiology

## 2019-08-09 DIAGNOSIS — Z20828 Contact with and (suspected) exposure to other viral communicable diseases: Secondary | ICD-10-CM | POA: Diagnosis not present

## 2019-08-09 DIAGNOSIS — Z01812 Encounter for preprocedural laboratory examination: Secondary | ICD-10-CM | POA: Diagnosis present

## 2019-08-10 ENCOUNTER — Telehealth (HOSPITAL_COMMUNITY): Payer: Self-pay

## 2019-08-10 LAB — NOVEL CORONAVIRUS, NAA (HOSP ORDER, SEND-OUT TO REF LAB; TAT 18-24 HRS): SARS-CoV-2, NAA: NOT DETECTED

## 2019-08-10 NOTE — Telephone Encounter (Signed)
Encounter complete. 

## 2019-08-11 ENCOUNTER — Telehealth (HOSPITAL_COMMUNITY): Payer: Self-pay

## 2019-08-11 NOTE — Telephone Encounter (Signed)
Encounter complete. 

## 2019-08-12 ENCOUNTER — Other Ambulatory Visit: Payer: Self-pay

## 2019-08-12 ENCOUNTER — Encounter (INDEPENDENT_AMBULATORY_CARE_PROVIDER_SITE_OTHER): Payer: Self-pay

## 2019-08-12 ENCOUNTER — Ambulatory Visit (HOSPITAL_COMMUNITY)
Admission: RE | Admit: 2019-08-12 | Discharge: 2019-08-12 | Disposition: A | Discharge status: Home / Self Care | Payer: Medicare Other | Source: Ambulatory Visit | Attending: Cardiovascular Disease | Admitting: Cardiovascular Disease

## 2019-08-12 DIAGNOSIS — R0602 Shortness of breath: Secondary | ICD-10-CM | POA: Insufficient documentation

## 2019-08-12 DIAGNOSIS — I2510 Atherosclerotic heart disease of native coronary artery without angina pectoris: Secondary | ICD-10-CM | POA: Diagnosis present

## 2019-08-12 LAB — EXERCISE TOLERANCE TEST
Estimated workload: 5.9 METS
Exercise duration (min): 4 min
Exercise duration (sec): 6 s
MPHR: 147 {beats}/min
Peak HR: 118 {beats}/min
Percent HR: 80 %
Rest HR: 75 {beats}/min

## 2019-08-18 ENCOUNTER — Telehealth: Payer: Self-pay | Admitting: Cardiology

## 2019-08-18 NOTE — Telephone Encounter (Signed)
Spoke to wife per DPR. Gave stress test results of pt. Verbalized understanding.

## 2019-08-18 NOTE — Telephone Encounter (Signed)
Wife of the patient called. The patient had a Stress Test on 12-11 and has not been informed of the results yet.  The home number does not have the answering machine hooked up, so the patient's cell phone is the best place to call and leave results on a voicemail.

## 2019-09-22 ENCOUNTER — Telehealth: Payer: Self-pay | Admitting: *Deleted

## 2019-09-22 NOTE — Telephone Encounter (Signed)
A message was left, re: his follow up visit. 

## 2019-10-03 ENCOUNTER — Encounter: Payer: Self-pay | Admitting: Gastroenterology

## 2019-11-09 ENCOUNTER — Other Ambulatory Visit: Payer: Self-pay | Admitting: Cardiology

## 2019-12-11 DIAGNOSIS — Z7189 Other specified counseling: Secondary | ICD-10-CM | POA: Insufficient documentation

## 2019-12-11 DIAGNOSIS — I422 Other hypertrophic cardiomyopathy: Secondary | ICD-10-CM | POA: Insufficient documentation

## 2019-12-11 NOTE — Progress Notes (Signed)
Cardiology Office Note   Date:  12/12/2019   ID:  Martin [name], DOB [birth year], [name] [id number]  PCP:  Martin Maple, MD  Cardiologist:   Martin Breeding, MD   Chief Complaint  Patient presents with  . Coronary Artery Disease      History of Present Illness: Martin [name] is a 74 y.o. male who presents for one year follow up of CAD. He did have a POET (Plain Old Exercise Treadmill) in 2015 and he had no evidence of ischemia.   He had a PE following shoulder surgery and was treated with Xarelto.   He had a POET (Plain Old Exercise Treadmill) with no evidence of ischemia in December of last year.    Since I last saw him he has been treated for prostate cancer.  He had radiation and seed implants.  He has been fatigued because of this.  He is not been back to his usual levels of activity.  He denies any resting symptoms such as chest discomfort, neck or arm discomfort.  He has had no new shortness of breath, PND or orthopnea.  He has had no new palpitations, presyncope or syncope.  He wants to start getting back to activity.  Past Medical History:  Diagnosis Date  . Benign prostatic hypertrophy   . Coronary artery disease    cath 04/2007 with 90-95% RCA stenosis. Had DES placed.  Had an acute stent thrombosis in the hospital that day and subsequently had 3 DES placed in the RCA. The LAD had 45% stenosis, in the circ had 35% stenosis. The EF was well preserved.  . Dyslipidemia   . GERD (gastroesophageal reflux disease)   . Ulcerative colitis     Past Surgical History:  Procedure Laterality Date  . INGUINAL HERNIA REPAIR  1999  . ROTATOR CUFF REPAIR Left      Current Outpatient Medications  Medication Sig Dispense Refill  . ASACOL HD 800 MG TBEC Take 1 tablet by mouth 3 (three) times daily.  3  . aspirin 81 MG tablet Take 81 mg by mouth daily.      Marland Kitchen atorvastatin (LIPITOR) 40 MG tablet Take 1 tablet (40 mg total) by mouth daily at 6 PM. 90 tablet 3  . loratadine (CLARITIN)  10 MG tablet Take 10 mg by mouth daily.    . meloxicam (MOBIC) 7.5 MG tablet Take 7.5 mg by mouth daily.    . metoprolol succinate (TOPROL-XL) 50 MG 24 hr tablet TAKE 1 TABLET (50 MG TOTAL) BY MOUTH DAILY. TAKE WITH OR IMMEDIATELY FOLLOWING A MEAL. 90 tablet 2  . multivitamin (THERAGRAN) per tablet Take 1 tablet by mouth daily.      . ranitidine (ZANTAC) 75 MG tablet Take 75 mg by mouth daily.      . sildenafil (VIAGRA) 50 MG tablet Take 1 tablet (50 mg total) by mouth as needed for erectile dysfunction. 10 tablet 3  . Tamsulosin HCl (FLOMAX) 0.4 MG CAPS Take 0.4 mg by mouth daily.      . nitroGLYCERIN (NITROSTAT) 0.4 MG SL tablet PLACE 1 TABLET (0.4 MG TOTAL) UNDER THE TONGUE EVERY 5 (FIVE) MINUTES AS NEEDED FOR CHEST PAIN. 25 tablet 5   No current facility-administered medications for this visit.    Allergies:   Morphine    ROS:  Please see the history of present illness.   Otherwise, review of systems are positive for none.   All other systems are reviewed and negative.    PHYSICAL  EXAM: VS:  BP (!) 142/72   Pulse 65   Ht 6\' 1"  (1.854 m)   Wt 238 lb 12.8 oz (108.3 kg)   BMI 31.51 kg/m  , BMI Body mass index is 31.51 kg/m. GENERAL:  Well appearing NECK:  No jugular venous distention, waveform within normal limits, carotid upstroke brisk and symmetric, no bruits, no thyromegaly LUNGS:  Clear to auscultation bilaterally CHEST:  Unremarkable HEART:  PMI not displaced or sustained,S1 and S2 within normal limits, no S3, no S4, no clicks, no rubs, no murmurs ABD:  Flat, positive bowel sounds normal in frequency in pitch, no bruits, no rebound, no guarding, no midline pulsatile mass, no hepatomegaly, no splenomegaly EXT:  2 plus pulses throughout, no edema, no cyanosis no clubbing   EKG:  EKG is ordered today. The ekg ordered today demonstrates sinus rhythm, rate 65, axis within normal limits, intervals within normal limits, no acute ST-T wave changes.   Recent Labs: No results  found for requested labs within last 8760 hours.      Wt Readings from Last 3 Encounters:  12/12/19 238 lb 12.8 oz (108.3 kg)  07/08/19 230 lb (104.3 kg)  09/15/18 229 lb 6.4 oz (104.1 kg)      Other studies Reviewed: Additional studies/ records that were reviewed today include: None. Review of the above records demonstrates:  Please see elsewhere in the note.     ASSESSMENT AND PLAN:  CAD - The patient has no new sypmtoms.  No further cardiovascular testing is indicated.  We will continue with aggressive risk reduction and meds as listed.  DYSLIPIDEMIA - I will call him with a copy of his lipid profile from his primary provider.  The goal is an LDL less than 70.  HTN - Blood pressure is at target.  No change in therapy.  CAROTID- He had moderate stenosis last year on the left side and he is to have repeat testing. I will arrange this.   COVID EDUCATION - He has had his vaccines.     Current medicines are reviewed at length with the patient today.  The patient does not have concerns regarding medicines.  The following changes have been made:  no change  Labs/ tests ordered today include: None  Orders Placed This Encounter  Procedures  . EKG 12-Lead  . VAS 09/17/18 CAROTID     Disposition:   FU with me in one year.     Signed, [place], MD  12/12/2019 8:39 PM    [facility]

## 2019-12-12 ENCOUNTER — Encounter: Payer: Self-pay | Admitting: Cardiology

## 2019-12-12 ENCOUNTER — Ambulatory Visit (HOSPITAL_COMMUNITY)
Admission: RE | Admit: 2019-12-12 | Payer: Medicare PPO | Source: Ambulatory Visit | Attending: Cardiology | Admitting: Cardiology

## 2019-12-12 ENCOUNTER — Ambulatory Visit (INDEPENDENT_AMBULATORY_CARE_PROVIDER_SITE_OTHER): Payer: Medicare PPO | Admitting: Cardiology

## 2019-12-12 ENCOUNTER — Other Ambulatory Visit: Payer: Self-pay

## 2019-12-12 VITALS — BP 142/72 | HR 65 | Ht 73.0 in | Wt 238.8 lb

## 2019-12-12 DIAGNOSIS — E785 Hyperlipidemia, unspecified: Secondary | ICD-10-CM | POA: Diagnosis not present

## 2019-12-12 DIAGNOSIS — I2510 Atherosclerotic heart disease of native coronary artery without angina pectoris: Secondary | ICD-10-CM | POA: Diagnosis not present

## 2019-12-12 DIAGNOSIS — I6529 Occlusion and stenosis of unspecified carotid artery: Secondary | ICD-10-CM

## 2019-12-12 DIAGNOSIS — I422 Other hypertrophic cardiomyopathy: Secondary | ICD-10-CM

## 2019-12-12 DIAGNOSIS — Z7189 Other specified counseling: Secondary | ICD-10-CM | POA: Diagnosis not present

## 2019-12-12 NOTE — Patient Instructions (Signed)
Medication Instructions:  No changes *If you need a refill on your cardiac medications before your next appointment, please call your pharmacy*  Lab Work: None ordered this visit  Testing/Procedures: Your physician has requested that you have a carotid duplex. This test is an ultrasound of the carotid arteries in your neck. It looks at blood flow through these arteries that supply the brain with blood. Allow one hour for this exam. There are no restrictions or special instructions.  Follow-Up: At [facility], you and your health needs are our priority.  As part of our continuing mission to provide you with exceptional heart care, we have created designated Provider Care Teams.  These Care Teams include your primary Cardiologist (physician) and Advanced Practice Providers (APPs -  Physician Assistants and Nurse Practitioners) who all work together to provide you with the care you need, when you need it.  We recommend signing up for the patient portal called "MyChart".  Sign up information is provided on this After Visit Summary.  MyChart is used to connect with patients for Virtual Visits (Telemedicine).  Patients are able to view lab/test results, encounter notes, upcoming appointments, etc.  Non-urgent messages can be sent to your provider as well.   To learn more about what you can do with MyChart, go to [email or web address].    Your next appointment:   12 month(s) You will receive a reminder letter in the mail two months in advance. If you don't receive a letter, please call our office to schedule the follow-up appointment.  The format for your next appointment:   In Person  Provider:   Rollene Rotunda, MD

## 2019-12-13 ENCOUNTER — Encounter (HOSPITAL_COMMUNITY): Payer: Medicare Other

## 2019-12-14 ENCOUNTER — Encounter (HOSPITAL_COMMUNITY): Payer: Self-pay

## 2019-12-14 ENCOUNTER — Ambulatory Visit (HOSPITAL_COMMUNITY): Payer: Medicare PPO

## 2019-12-28 ENCOUNTER — Other Ambulatory Visit: Payer: Self-pay

## 2019-12-28 ENCOUNTER — Other Ambulatory Visit (HOSPITAL_COMMUNITY): Payer: Self-pay | Admitting: Cardiology

## 2019-12-28 ENCOUNTER — Ambulatory Visit (HOSPITAL_COMMUNITY)
Admission: RE | Admit: 2019-12-28 | Discharge: 2019-12-28 | Disposition: A | Discharge status: Home / Self Care | Payer: Medicare PPO | Source: Ambulatory Visit | Attending: Internal Medicine | Admitting: Internal Medicine

## 2019-12-28 DIAGNOSIS — I6523 Occlusion and stenosis of bilateral carotid arteries: Secondary | ICD-10-CM

## 2019-12-28 DIAGNOSIS — I6529 Occlusion and stenosis of unspecified carotid artery: Secondary | ICD-10-CM | POA: Insufficient documentation

## 2020-01-10 ENCOUNTER — Other Ambulatory Visit: Payer: Self-pay

## 2020-01-10 DIAGNOSIS — I6529 Occlusion and stenosis of unspecified carotid artery: Secondary | ICD-10-CM

## 2020-01-10 NOTE — Progress Notes (Signed)
Encounter opened in error, orders already placed.

## 2020-08-04 ENCOUNTER — Other Ambulatory Visit: Payer: Self-pay | Admitting: Cardiology

## 2020-11-01 DIAGNOSIS — I10 Essential (primary) hypertension: Secondary | ICD-10-CM | POA: Insufficient documentation

## 2020-11-01 NOTE — Progress Notes (Signed)
Cardiology Office Note   Date:  11/02/2020   ID:  Martin [name], DOB November 25, 1945, [name] [id number]  PCP:  Martin Stall, MD  Cardiologist:   Martin Rotunda, MD   Chief Complaint  Patient presents with  . Fatigue      History of Present Illness: Martin [name] is a 75 y.o. male who presents for one year follow up of CAD. He did have a POET (Plain Old Exercise Treadmill) in 2015 and he had no evidence of ischemia.   He had a PE following shoulder surgery and was treated with Xarelto.   He had a POET (Plain Old Exercise Treadmill) with no evidence of ischemia in December of last year.    Since I last saw him profound fatigue.  He thought this was related to his hormone therapy for prostate cancer but he has not been on this for months.  He says he gets tired doing anything including normal activities that he was doing routinely.  He will try to go on do some yard work and can only do about 10 minutes before he is fatigued.  He does get a little discomfort and chest tightness with shortness of breath if he is doing too much.  He has not taken any nitroglycerin.  He is not having any resting shortness of breath, PND or orthopnea.  He is not having any palpitations, presyncope or syncope.  It is not quite like the symptoms he had with his myocardial infarction.  Of note he is not sleeping well.  He wakes up tired.  There is no witnessed snoring.  He does have aches and pains that he thinks interferes with his sleep.   Past Medical History:  Diagnosis Date  . Benign prostatic hypertrophy   . Coronary artery disease    cath 04/2007 with 90-95% RCA stenosis. Had DES placed.  Had an acute stent thrombosis in the hospital that day and subsequently had 3 DES placed in the RCA. The LAD had 45% stenosis, in the circ had 35% stenosis. The EF was well preserved.  . Dyslipidemia   . GERD (gastroesophageal reflux disease)   . Ulcerative colitis     Past Surgical History:  Procedure Laterality Date   . INGUINAL HERNIA REPAIR  1999  . ROTATOR CUFF REPAIR Left      Current Outpatient Medications  Medication Sig Dispense Refill  . ASACOL HD 800 MG TBEC Take 1 tablet by mouth 3 (three) times daily.  3  . aspirin 81 MG tablet Take 81 mg by mouth daily.    Marland Kitchen atorvastatin (LIPITOR) 40 MG tablet Take 1 tablet (40 mg total) by mouth daily at 6 PM. 90 tablet 3  . loratadine (CLARITIN) 10 MG tablet Take 10 mg by mouth daily.    . meloxicam (MOBIC) 7.5 MG tablet Take 7.5 mg by mouth daily.    . metoprolol succinate (TOPROL-XL) 50 MG 24 hr tablet TAKE 1 TABLET (50 MG TOTAL) BY MOUTH DAILY. TAKE WITH OR IMMEDIATELY FOLLOWING A MEAL. 90 tablet 3  . multivitamin (THERAGRAN) per tablet Take 1 tablet by mouth daily.    . nitroGLYCERIN (NITROSTAT) 0.4 MG SL tablet PLACE 1 TABLET (0.4 MG TOTAL) UNDER THE TONGUE EVERY 5 (FIVE) MINUTES AS NEEDED FOR CHEST PAIN. 25 tablet 5  . omeprazole (PRILOSEC OTC) 20 MG tablet Take 20 mg by mouth daily.    . sildenafil (VIAGRA) 50 MG tablet Take 1 tablet (50 mg total) by mouth as needed for  erectile dysfunction. 10 tablet 3  . tamsulosin (FLOMAX) 0.4 MG CAPS capsule Take 0.4 mg by mouth daily.    . traZODone (DESYREL) 50 MG tablet      No current facility-administered medications for this visit.    Allergies:   Morphine    ROS:  Please see the history of present illness.   Otherwise, review of systems are positive for none.   All other systems are reviewed and negative.    PHYSICAL EXAM: VS:  BP 118/64   Pulse 68   Ht 6\' 1"  (1.854 m)   Wt 223 lb 3.2 oz (101.2 kg)   SpO2 94%   BMI 29.45 kg/m  , BMI Body mass index is 29.45 kg/m. GENERAL:  Well appearing NECK:  No jugular venous distention, waveform within normal limits, carotid upstroke brisk and symmetric, no bruits, no thyromegaly LUNGS:  Clear to auscultation bilaterally CHEST:  Unremarkable HEART:  PMI not displaced or sustained,S1 and S2 within normal limits, no S3, no S4, no clicks, no rubs, no  murmurs ABD:  Flat, positive bowel sounds normal in frequency in pitch, no bruits, no rebound, no guarding, no midline pulsatile mass, no hepatomegaly, no splenomegaly EXT:  2 plus pulses throughout, no edema, no cyanosis no clubbing   EKG:  EKG is  ordered today. The ekg ordered today demonstrates sinus rhythm, rate 68, axis within normal limits, intervals within normal limits, no acute ST-T wave changes.   Recent Labs: No results found for requested labs within last 8760 hours.      Wt Readings from Last 3 Encounters:  11/02/20 223 lb 3.2 oz (101.2 kg)  12/12/19 238 lb 12.8 oz (108.3 kg)  07/08/19 230 lb (104.3 kg)      Other studies Reviewed: Additional studies/ records that were reviewed today include: None. Review of the above records demonstrates:  Please see elsewhere in the note.     ASSESSMENT AND PLAN:  CAD - The patient has some chest discomfort and profound fatigue (anginal equivalent.  I think the pretest probability of obstructive coronary disease is moderate.  I will refer him for stress testing.  He was not able to complete a treadmill previously so he will need a 13/06/20.  (See below)  DYSLIPIDEMIA - Done by his primary care provider.  We will try to call the office to get that resolved and I would like to check that when available.  The goal is an LDL less than 70.   HTN - Blood pressure is at target.  No change in therapy.   CAROTID- He is overdue for follow up.    Fatigue: If his stress test unremarkable I will order a sleep study to try to explain his fatigue.  He has a very high Epworth scale.   Current medicines are reviewed at length with the patient today.  The patient does not have concerns regarding medicines.  The following changes have been made:   None  Labs/ tests ordered today include:   Orders Placed This Encounter  Procedures  . MYOCARDIAL PERFUSION IMAGING  . EKG 12-Lead  . VAS [employer] CAROTID     Disposition:   FU with  me in 4 months.    Signed, [place], MD  11/02/2020 3:12 PM    [facility]

## 2020-11-02 ENCOUNTER — Ambulatory Visit: Payer: Medicare PPO | Admitting: Cardiology

## 2020-11-02 ENCOUNTER — Encounter: Payer: Self-pay | Admitting: Cardiology

## 2020-11-02 ENCOUNTER — Other Ambulatory Visit: Payer: Self-pay

## 2020-11-02 VITALS — BP 118/64 | HR 68 | Ht 73.0 in | Wt 223.2 lb

## 2020-11-02 DIAGNOSIS — R072 Precordial pain: Secondary | ICD-10-CM

## 2020-11-02 DIAGNOSIS — I10 Essential (primary) hypertension: Secondary | ICD-10-CM

## 2020-11-02 DIAGNOSIS — I2510 Atherosclerotic heart disease of native coronary artery without angina pectoris: Secondary | ICD-10-CM

## 2020-11-02 DIAGNOSIS — E785 Hyperlipidemia, unspecified: Secondary | ICD-10-CM

## 2020-11-02 DIAGNOSIS — I6529 Occlusion and stenosis of unspecified carotid artery: Secondary | ICD-10-CM

## 2020-11-02 NOTE — Patient Instructions (Addendum)
Medication Instructions:  No changes *If you need a refill on your cardiac medications before your next appointment, please call your pharmacy*  Testing/Procedures: Your physician has requested that you have a lexiscan myoview. For further information please visit [email or web address] Please follow instruction sheet, as given.  Your physician has requested that you have a carotid duplex. This test is an ultrasound of the carotid arteries in your neck. It looks at blood flow through these arteries that supply the brain with blood. Allow one hour for this exam. There are no restrictions or special instructions.  Follow-Up: At [facility], you and your health needs are our priority.  As part of our continuing mission to provide you with exceptional heart care, we have created designated Provider Care Teams.  These Care Teams include your primary Cardiologist (physician) and Advanced Practice Providers (APPs -  Physician Assistants and Nurse Practitioners) who all work together to provide you with the care you need, when you need it.   Your next appointment:   4 month(s)  The format for your next appointment:   In Person  Provider:   Rollene Rotunda, MD   Other instructions: My doctor scheduled me for an MPI Eugenie Birks) test. What is it? MPI stands for myocardial perfusion imaging. The "myocardium" is your heart muscle. "Perfusion" refers to blood flow. And "imaging" is exactly what it sounds like, taking pictures. So, myocardial perfusion imaging is basically taking pictures of the blood flow to your heart. MPI is also called a cardiac nuclear stress test. It is a commonly used test that provides detailed images that can be used to diagnose and assess coronary artery disease.  Does it hurt? Except for a small needle (catheter) that will be placed in a vein (IV) in your arm at the start of the test, MPI is a noninvasive test. That means it takes place outside of your body and does  not involve surgery of any kind. You will stay awake and alert the entire time. A small amount of radioactive liquid (REGADENOSON), called a tracer, will be injected into your bloodstream through the catheter during the MPI test. This tracer helps the doctor see the blood flow to your heart. You probably will not feel any effects from the tracer, which your body eliminates by natural means.  Which foods, drinks, and medications should I avoid before my test? DO NOT consume caffeine-containing foods and drinks or medications that contain methylxanthines (eg, caffeine, aminophylline, or theophylline) in the 12 hours before your scheduled stress test in the event that pharmacologic stress is used. In addition, avoid any prescription medications containing dipyridamole in the 48 hours before your stress test.  TABLE 1: FOODS TO AVOID chocolate candies chocolate cakes brownies chocolate pudding energy bars foods containing guarana  TABLE 2: DRINKS TO AVOID chocolate milk hot cocoa coffee (brewed, instant, iced, decaf) tea (brewed, instant, iced, decaf) soda pop (including "caffeine-free") energy drinks drinks containing guarana

## 2020-11-06 ENCOUNTER — Telehealth (HOSPITAL_COMMUNITY): Payer: Self-pay | Admitting: *Deleted

## 2020-11-06 NOTE — Telephone Encounter (Signed)
Close encounter 

## 2020-11-08 ENCOUNTER — Ambulatory Visit (HOSPITAL_BASED_OUTPATIENT_CLINIC_OR_DEPARTMENT_OTHER)
Admission: RE | Admit: 2020-11-08 | Discharge: 2020-11-08 | Disposition: A | Discharge status: Home / Self Care | Payer: Medicare PPO | Source: Ambulatory Visit | Attending: Cardiology | Admitting: Cardiology

## 2020-11-08 ENCOUNTER — Other Ambulatory Visit (HOSPITAL_COMMUNITY): Payer: Self-pay | Admitting: Cardiology

## 2020-11-08 ENCOUNTER — Other Ambulatory Visit: Payer: Self-pay

## 2020-11-08 ENCOUNTER — Ambulatory Visit (HOSPITAL_COMMUNITY)
Admission: RE | Admit: 2020-11-08 | Discharge: 2020-11-08 | Disposition: A | Discharge status: Home / Self Care | Payer: Medicare PPO | Source: Ambulatory Visit | Attending: Cardiology | Admitting: Cardiology

## 2020-11-08 DIAGNOSIS — K219 Gastro-esophageal reflux disease without esophagitis: Secondary | ICD-10-CM | POA: Diagnosis not present

## 2020-11-08 DIAGNOSIS — I6529 Occlusion and stenosis of unspecified carotid artery: Secondary | ICD-10-CM | POA: Diagnosis present

## 2020-11-08 DIAGNOSIS — I6523 Occlusion and stenosis of bilateral carotid arteries: Secondary | ICD-10-CM

## 2020-11-08 DIAGNOSIS — R072 Precordial pain: Secondary | ICD-10-CM | POA: Insufficient documentation

## 2020-11-08 DIAGNOSIS — E785 Hyperlipidemia, unspecified: Secondary | ICD-10-CM | POA: Diagnosis not present

## 2020-11-08 DIAGNOSIS — I2510 Atherosclerotic heart disease of native coronary artery without angina pectoris: Secondary | ICD-10-CM | POA: Insufficient documentation

## 2020-11-08 DIAGNOSIS — Z86711 Personal history of pulmonary embolism: Secondary | ICD-10-CM | POA: Insufficient documentation

## 2020-11-08 DIAGNOSIS — I10 Essential (primary) hypertension: Secondary | ICD-10-CM | POA: Insufficient documentation

## 2020-11-08 LAB — MYOCARDIAL PERFUSION IMAGING
LV dias vol: 134 mL (ref 62–150)
LV sys vol: 69 mL
Peak HR: 74 {beats}/min
Rest HR: 57 {beats}/min
SDS: 11
SRS: 9
SSS: 20
TID: 0.98

## 2020-11-08 MED ORDER — AMINOPHYLLINE 25 MG/ML IV SOLN
75.0000 mg | Freq: Once | INTRAVENOUS | Status: AC
  Administered 2020-11-08: 75 mg via INTRAVENOUS

## 2020-11-08 MED ORDER — TECHNETIUM TC 99M TETROFOSMIN IV KIT
10.3000 | PACK | Freq: Once | INTRAVENOUS | Status: AC | PRN
  Administered 2020-11-08: 10.3 via INTRAVENOUS
  Filled 2020-11-08: qty 11

## 2020-11-08 MED ORDER — REGADENOSON 0.4 MG/5ML IV SOLN
0.4000 mg | Freq: Once | INTRAVENOUS | Status: AC
  Administered 2020-11-08: 0.4 mg via INTRAVENOUS

## 2020-11-08 MED ORDER — TECHNETIUM TC 99M TETROFOSMIN IV KIT
30.6000 | PACK | Freq: Once | INTRAVENOUS | Status: AC | PRN
  Administered 2020-11-08: 30.6 via INTRAVENOUS
  Filled 2020-11-08: qty 31

## 2020-11-09 ENCOUNTER — Telehealth: Payer: Self-pay | Admitting: Cardiology

## 2020-11-09 NOTE — Telephone Encounter (Signed)
Will forward to Dr. Antoine Poche for results of stress test. Informed patient's wife (DPR) that we will give her a call and soon as we get the results. Patient's wife verbalized understanding.

## 2020-11-09 NOTE — Telephone Encounter (Signed)
I tried to call with results to went to voicemail.

## 2020-11-09 NOTE — Telephone Encounter (Signed)
    Pt's wife is calling to check if stress test result is available, she said she is a little concern since pt is still now feeling good

## 2020-11-12 NOTE — Telephone Encounter (Signed)
Follow Up:       Pt's wife is returning a call from this morning, concerning pt's test results.

## 2020-11-12 NOTE — Telephone Encounter (Signed)
Called patient, advised that MD was attempting to contact patient, advised I would send him a message- she suggested to give a call on [phone].   Will route to MD to make aware of return call back- I did not see any result notes to give to patient.

## 2020-11-14 ENCOUNTER — Telehealth: Payer: Self-pay | Admitting: *Deleted

## 2020-11-14 ENCOUNTER — Encounter: Payer: Self-pay | Admitting: *Deleted

## 2020-11-14 DIAGNOSIS — R9439 Abnormal result of other cardiovascular function study: Secondary | ICD-10-CM

## 2020-11-14 DIAGNOSIS — Z79899 Other long term (current) drug therapy: Secondary | ICD-10-CM

## 2020-11-14 DIAGNOSIS — Z01812 Encounter for preprocedural laboratory examination: Secondary | ICD-10-CM

## 2020-11-14 NOTE — Telephone Encounter (Signed)
Dr Antoine Poche spoke with patient who has been scheduled for cardiac cath

## 2020-11-14 NOTE — Telephone Encounter (Signed)
-----   Message from Rollene Rotunda, MD sent at 11/13/2020 11:28 AM EDT ----- Called and discussed the results with the patient.  He will need a heart cath.  The patient understands that risks included but are not limited to stroke (1 in 1000), death (1 in 1000), kidney failure [usually temporary] (1 in 500), bleeding (1 in 200), allergic reaction [possibly serious] (1 in 200).  The patient understands and agrees to proceed.   Please call and arranged.

## 2020-11-14 NOTE — Telephone Encounter (Signed)
    [facility] CARDIOVASCULAR DIVISION [facility] [address]LINE [address] [place] [address] [place] [address] Dept: [phone] Loc: [phone]  HIROYUKI OZANICH  11/14/2020  You are scheduled for a Cardiac Catheterization on Tuesday, March 22 with Dr. Nicki Guadalajara.  1. Please arrive at the [facility] (Main Entrance A) at [facility]: [address] [place], [place] [address] at 8:30 AM (This time is two hours before your procedure to ensure your preparation). Free valet parking service is available.   Special note: Every effort is made to have your procedure done on time. Please understand that emergencies sometimes delay scheduled procedures.  2. Diet: Do not eat solid foods after midnight.  The patient may have clear liquids until 5am upon the day of the procedure.  3. Labs: You will need to have blood drawn on Thursday, March 17 at [facility] [address], [place]  Open: 8am - 5pm (Lunch 12:30 - 1:30)   Phone: [phone]. You do not need to be fasting.  4. Medication instructions in preparation for your procedure:   Contrast Allergy: No  On the morning of your procedure, take your Aspirin and any morning medicines NOT listed above.  You may use sips of water.  5. Plan for one night stay--bring personal belongings. 6. Bring a current list of your medications and current insurance cards. 7. You MUST have a responsible person to drive you home. 8. Someone MUST be with you the first 24 hours after you arrive home or your discharge will be delayed. 9. Please wear clothes that are easy to get on and off and wear slip-on shoes.  Thank you for allowing [place] to care for you!   -- [facility]

## 2020-11-15 ENCOUNTER — Other Ambulatory Visit (HOSPITAL_COMMUNITY)
Admission: RE | Admit: 2020-11-15 | Discharge: 2020-11-15 | Disposition: A | Discharge status: Home / Self Care | Payer: Medicare PPO | Source: Ambulatory Visit | Attending: Cardiovascular Disease | Admitting: Cardiovascular Disease

## 2020-11-15 DIAGNOSIS — Z01812 Encounter for preprocedural laboratory examination: Secondary | ICD-10-CM | POA: Insufficient documentation

## 2020-11-15 DIAGNOSIS — Z20822 Contact with and (suspected) exposure to covid-19: Secondary | ICD-10-CM | POA: Insufficient documentation

## 2020-11-15 LAB — SARS CORONAVIRUS 2 (TAT 6-24 HRS): SARS Coronavirus 2: NEGATIVE

## 2020-11-16 LAB — BASIC METABOLIC PANEL
BUN/Creatinine Ratio: 17 (ref 10–24)
BUN: 20 mg/dL (ref 8–27)
CO2: 23 mmol/L (ref 20–29)
Calcium: 9.6 mg/dL (ref 8.6–10.2)
Chloride: 100 mmol/L (ref 96–106)
Creatinine, Ser: 1.16 mg/dL (ref 0.76–1.27)
Glucose: 92 mg/dL (ref 65–99)
Potassium: 4.4 mmol/L (ref 3.5–5.2)
Sodium: 139 mmol/L (ref 134–144)
eGFR: 66 mL/min/{1.73_m2} (ref >59)

## 2020-11-16 LAB — CBC
Hematocrit: 35.6 % — ABNORMAL LOW (ref 37.5–51.0)
Hemoglobin: 11.9 g/dL — ABNORMAL LOW (ref 13.0–17.7)
MCH: 30.5 pg (ref 26.6–33.0)
MCHC: 33.4 g/dL (ref 31.5–35.7)
MCV: 91 fL (ref 79–97)
Platelets: 298 10*3/uL (ref 150–450)
RBC: 3.9 x10E6/uL — ABNORMAL LOW (ref 4.14–5.80)
RDW: 11.9 % (ref 11.6–15.4)
WBC: 9.4 10*3/uL (ref 3.4–10.8)

## 2020-11-19 ENCOUNTER — Telehealth: Payer: Self-pay | Admitting: *Deleted

## 2020-11-19 NOTE — Telephone Encounter (Signed)
Pt contacted pre-catheterization scheduled at [facility] for:  Tuesday November 20, 2020 10:30 AM Verified arrival time and place: [facility] Main Entrance A [facility]) at: 8:30 AM   No solid food after midnight prior to cath, clear liquids until 5 AM day of procedure.  AM meds can be  taken pre-cath with sips of water including: ASA 81 mg   Confirmed patient has responsible adult to drive home post procedure and be with patient first 24 hours after arriving home: yes  You are allowed ONE visitor in the waiting room during the time you are at the hospital for your procedure. Both you and your visitor must wear a mask once you enter the hospital.  Reviewed procedure/mask/visitor instructions with patient.

## 2020-11-20 ENCOUNTER — Inpatient Hospital Stay (HOSPITAL_COMMUNITY): Payer: Medicare PPO

## 2020-11-20 ENCOUNTER — Other Ambulatory Visit: Payer: Self-pay

## 2020-11-20 ENCOUNTER — Encounter (HOSPITAL_COMMUNITY)
Admission: AD | Disposition: A | Discharge status: Home Health | Payer: Self-pay | Source: Home / Self Care | Attending: Cardiothoracic Surgery

## 2020-11-20 ENCOUNTER — Inpatient Hospital Stay (HOSPITAL_COMMUNITY)
Admission: AD | Admit: 2020-11-20 | Discharge: 2020-11-26 | DRG: 234 | Disposition: A | Discharge status: Home Health | Payer: Medicare PPO | Attending: Cardiothoracic Surgery | Admitting: Cardiothoracic Surgery

## 2020-11-20 DIAGNOSIS — I493 Ventricular premature depolarization: Secondary | ICD-10-CM | POA: Diagnosis present

## 2020-11-20 DIAGNOSIS — I25119 Atherosclerotic heart disease of native coronary artery with unspecified angina pectoris: Principal | ICD-10-CM

## 2020-11-20 DIAGNOSIS — I25110 Atherosclerotic heart disease of native coronary artery with unstable angina pectoris: Secondary | ICD-10-CM | POA: Diagnosis not present

## 2020-11-20 DIAGNOSIS — R9439 Abnormal result of other cardiovascular function study: Secondary | ICD-10-CM | POA: Diagnosis not present

## 2020-11-20 DIAGNOSIS — D62 Acute posthemorrhagic anemia: Secondary | ICD-10-CM | POA: Diagnosis not present

## 2020-11-20 DIAGNOSIS — Z8249 Family history of ischemic heart disease and other diseases of the circulatory system: Secondary | ICD-10-CM

## 2020-11-20 DIAGNOSIS — Z8546 Personal history of malignant neoplasm of prostate: Secondary | ICD-10-CM | POA: Diagnosis not present

## 2020-11-20 DIAGNOSIS — E8770 Fluid overload, unspecified: Secondary | ICD-10-CM | POA: Diagnosis present

## 2020-11-20 DIAGNOSIS — Z955 Presence of coronary angioplasty implant and graft: Secondary | ICD-10-CM

## 2020-11-20 DIAGNOSIS — E119 Type 2 diabetes mellitus without complications: Secondary | ICD-10-CM | POA: Diagnosis present

## 2020-11-20 DIAGNOSIS — Z951 Presence of aortocoronary bypass graft: Secondary | ICD-10-CM

## 2020-11-20 DIAGNOSIS — I10 Essential (primary) hypertension: Secondary | ICD-10-CM | POA: Diagnosis present

## 2020-11-20 DIAGNOSIS — Z01810 Encounter for preprocedural cardiovascular examination: Secondary | ICD-10-CM | POA: Diagnosis not present

## 2020-11-20 DIAGNOSIS — Z9889 Other specified postprocedural states: Secondary | ICD-10-CM

## 2020-11-20 DIAGNOSIS — E785 Hyperlipidemia, unspecified: Secondary | ICD-10-CM | POA: Diagnosis present

## 2020-11-20 DIAGNOSIS — K219 Gastro-esophageal reflux disease without esophagitis: Secondary | ICD-10-CM | POA: Diagnosis present

## 2020-11-20 DIAGNOSIS — J90 Pleural effusion, not elsewhere classified: Secondary | ICD-10-CM

## 2020-11-20 DIAGNOSIS — I2510 Atherosclerotic heart disease of native coronary artery without angina pectoris: Secondary | ICD-10-CM | POA: Diagnosis present

## 2020-11-20 DIAGNOSIS — N400 Enlarged prostate without lower urinary tract symptoms: Secondary | ICD-10-CM | POA: Diagnosis present

## 2020-11-20 HISTORY — PX: LEFT HEART CATH AND CORONARY ANGIOGRAPHY: CATH118249

## 2020-11-20 LAB — URINALYSIS, ROUTINE W REFLEX MICROSCOPIC
Bilirubin Urine: NEGATIVE
Glucose, UA: NEGATIVE mg/dL
Hgb urine dipstick: NEGATIVE
Ketones, ur: NEGATIVE mg/dL
Leukocytes,Ua: NEGATIVE
Nitrite: NEGATIVE
Protein, ur: NEGATIVE mg/dL
Specific Gravity, Urine: 1.035 — ABNORMAL HIGH (ref 1.005–1.030)
pH: 5 (ref 5.0–8.0)

## 2020-11-20 LAB — ECHOCARDIOGRAM COMPLETE
AR max vel: 1.84 cm2
AV Area VTI: 1.81 cm2
AV Area mean vel: 1.91 cm2
AV Mean grad: 4 mmHg
AV Peak grad: 7.2 mmHg
Ao pk vel: 1.34 m/s
Area-P 1/2: 2.87 cm2
Height: 73 in
S' Lateral: 2.8 cm
Weight: 3548.52 oz

## 2020-11-20 LAB — CBC
HCT: 34.3 % — ABNORMAL LOW (ref 39.0–52.0)
Hemoglobin: 11 g/dL — ABNORMAL LOW (ref 13.0–17.0)
MCH: 30.1 pg (ref 26.0–34.0)
MCHC: 32.1 g/dL (ref 30.0–36.0)
MCV: 93.7 fL (ref 80.0–100.0)
Platelets: 254 10*3/uL (ref 150–400)
RBC: 3.66 MIL/uL — ABNORMAL LOW (ref 4.22–5.81)
RDW: 12.9 % (ref 11.5–15.5)
WBC: 7 10*3/uL (ref 4.0–10.5)
nRBC: 0 % (ref 0.0–0.2)

## 2020-11-20 LAB — BLOOD GAS, ARTERIAL
Acid-base deficit: 0.7 mmol/L (ref 0.0–2.0)
Bicarbonate: 22.8 mmol/L (ref 20.0–28.0)
Drawn by: 27016
FIO2: 28
O2 Saturation: 98.7 %
Patient temperature: 37
pCO2 arterial: 33.6 mmHg (ref 32.0–48.0)
pH, Arterial: 7.446 (ref 7.350–7.450)
pO2, Arterial: 120 mmHg — ABNORMAL HIGH (ref 83.0–108.0)

## 2020-11-20 LAB — CREATININE, SERUM
Creatinine, Ser: 0.97 mg/dL (ref 0.61–1.24)
GFR, Estimated: >60 mL/min (ref >60)

## 2020-11-20 LAB — ABO/RH: ABO/RH(D): A NEG

## 2020-11-20 SURGERY — LEFT HEART CATH AND CORONARY ANGIOGRAPHY
Anesthesia: LOCAL

## 2020-11-20 MED ORDER — TRANEXAMIC ACID (OHS) PUMP PRIME SOLUTION
2.0000 mg/kg | INTRAVENOUS | Status: DC
  Filled 2020-11-20: qty 2.01

## 2020-11-20 MED ORDER — ENOXAPARIN SODIUM 40 MG/0.4ML ~~LOC~~ SOLN: 40.0000 mg | SUBCUTANEOUS | Status: DC

## 2020-11-20 MED ORDER — LORATADINE 10 MG PO TABS
10.0000 mg | ORAL_TABLET | Freq: Every day | ORAL | Status: DC
  Administered 2020-11-22 – 2020-11-26 (×5): 10 mg via ORAL
  Filled 2020-11-20 (×5): qty 1

## 2020-11-20 MED ORDER — LABETALOL HCL 5 MG/ML IV SOLN: 10.0000 mg | INTRAVENOUS | Status: AC | PRN

## 2020-11-20 MED ORDER — INSULIN REGULAR(HUMAN) IN NACL 100-0.9 UT/100ML-% IV SOLN
INTRAVENOUS | Status: AC
  Administered 2020-11-21: 2.6 [IU]/h via INTRAVENOUS
  Filled 2020-11-20: qty 100

## 2020-11-20 MED ORDER — ASPIRIN 81 MG PO TABS: 81.0000 mg | ORAL_TABLET | Freq: Every day | ORAL | Status: DC

## 2020-11-20 MED ORDER — HEPARIN SODIUM (PORCINE) 1000 UNIT/ML IJ SOLN
INTRAMUSCULAR | Status: AC
  Filled 2020-11-20: qty 1

## 2020-11-20 MED ORDER — CHLORHEXIDINE GLUCONATE CLOTH 2 % EX PADS: 6.0000 | MEDICATED_PAD | Freq: Once | CUTANEOUS | Status: DC

## 2020-11-20 MED ORDER — NOREPINEPHRINE 4 MG/250ML-% IV SOLN
0.0000 ug/min | INTRAVENOUS | Status: DC
  Filled 2020-11-20: qty 250

## 2020-11-20 MED ORDER — VERAPAMIL HCL 2.5 MG/ML IV SOLN
INTRAVENOUS | Status: DC | PRN
  Administered 2020-11-20: 10 mL via INTRA_ARTERIAL

## 2020-11-20 MED ORDER — FENTANYL CITRATE (PF) 100 MCG/2ML IJ SOLN
INTRAMUSCULAR | Status: AC
  Filled 2020-11-20: qty 2

## 2020-11-20 MED ORDER — HEPARIN SODIUM (PORCINE) 1000 UNIT/ML IJ SOLN
INTRAMUSCULAR | Status: DC | PRN
  Administered 2020-11-20: 5000 [IU] via INTRAVENOUS

## 2020-11-20 MED ORDER — DEXMEDETOMIDINE HCL IN NACL 400 MCG/100ML IV SOLN
0.1000 ug/kg/h | INTRAVENOUS | Status: AC
  Administered 2020-11-21: .3 ug/kg/h via INTRAVENOUS
  Filled 2020-11-20: qty 100

## 2020-11-20 MED ORDER — CHLORHEXIDINE GLUCONATE 0.12 % MT SOLN
15.0000 mL | Freq: Once | OROMUCOSAL | Status: AC
  Administered 2020-11-21: 15 mL via OROMUCOSAL
  Filled 2020-11-20: qty 15

## 2020-11-20 MED ORDER — VANCOMYCIN HCL 1500 MG/300ML IV SOLN
1500.0000 mg | INTRAVENOUS | Status: AC
  Administered 2020-11-21: 1500 mg via INTRAVENOUS
  Filled 2020-11-20: qty 300

## 2020-11-20 MED ORDER — HEPARIN (PORCINE) IN NACL 1000-0.9 UT/500ML-% IV SOLN
INTRAVENOUS | Status: AC
  Filled 2020-11-20: qty 1000

## 2020-11-20 MED ORDER — ACETAMINOPHEN 325 MG PO TABS: 650.0000 mg | ORAL_TABLET | ORAL | Status: DC | PRN

## 2020-11-20 MED ORDER — HYDRALAZINE HCL 20 MG/ML IJ SOLN: 10.0000 mg | INTRAMUSCULAR | Status: AC | PRN

## 2020-11-20 MED ORDER — EPINEPHRINE HCL 5 MG/250ML IV SOLN IN NS
0.0000 ug/min | INTRAVENOUS | Status: DC
  Filled 2020-11-20: qty 250

## 2020-11-20 MED ORDER — LIDOCAINE HCL (PF) 1 % IJ SOLN
INTRAMUSCULAR | Status: DC | PRN
  Administered 2020-11-20: 2 mL

## 2020-11-20 MED ORDER — SODIUM CHLORIDE 0.9% FLUSH: 3.0000 mL | Freq: Two times a day (BID) | INTRAVENOUS | Status: DC

## 2020-11-20 MED ORDER — MESALAMINE 400 MG PO CPDR
800.0000 mg | DELAYED_RELEASE_CAPSULE | Freq: Three times a day (TID) | ORAL | Status: DC
  Administered 2020-11-20 – 2020-11-26 (×16): 800 mg via ORAL
  Filled 2020-11-20 (×21): qty 2

## 2020-11-20 MED ORDER — MIDAZOLAM HCL 2 MG/2ML IJ SOLN
INTRAMUSCULAR | Status: AC
  Filled 2020-11-20: qty 2

## 2020-11-20 MED ORDER — SODIUM CHLORIDE 0.9 % IV SOLN
750.0000 mg | INTRAVENOUS | Status: DC
  Filled 2020-11-20: qty 750

## 2020-11-20 MED ORDER — ISOSORBIDE MONONITRATE ER 30 MG PO TB24
30.0000 mg | ORAL_TABLET | Freq: Every day | ORAL | Status: DC
  Administered 2020-11-20: 30 mg via ORAL
  Filled 2020-11-20: qty 1

## 2020-11-20 MED ORDER — NITROGLYCERIN 0.4 MG SL SUBL: 0.4000 mg | SUBLINGUAL_TABLET | SUBLINGUAL | Status: DC | PRN

## 2020-11-20 MED ORDER — MAGNESIUM SULFATE 50 % IJ SOLN
40.0000 meq | INTRAMUSCULAR | Status: DC
  Filled 2020-11-20: qty 9.85

## 2020-11-20 MED ORDER — PLASMA-LYTE 148 IV SOLN
INTRAVENOUS | Status: DC
  Filled 2020-11-20: qty 2.5

## 2020-11-20 MED ORDER — ASPIRIN 81 MG PO CHEW: 81.0000 mg | CHEWABLE_TABLET | Freq: Every day | ORAL | Status: DC

## 2020-11-20 MED ORDER — METOPROLOL TARTRATE 12.5 MG HALF TABLET
12.5000 mg | ORAL_TABLET | Freq: Once | ORAL | Status: AC
  Administered 2020-11-21: 12.5 mg via ORAL
  Filled 2020-11-20: qty 1

## 2020-11-20 MED ORDER — TRANEXAMIC ACID 1000 MG/10ML IV SOLN
1.5000 mg/kg/h | INTRAVENOUS | Status: AC
  Administered 2020-11-21: 1.5 mg/kg/h via INTRAVENOUS
  Filled 2020-11-20: qty 25

## 2020-11-20 MED ORDER — MUPIROCIN 2 % EX OINT: 1.0000 "application " | TOPICAL_OINTMENT | Freq: Two times a day (BID) | CUTANEOUS | Status: DC

## 2020-11-20 MED ORDER — SODIUM CHLORIDE 0.9 % IV SOLN: INTRAVENOUS | Status: DC

## 2020-11-20 MED ORDER — HEPARIN (PORCINE) IN NACL 1000-0.9 UT/500ML-% IV SOLN
INTRAVENOUS | Status: DC | PRN
  Administered 2020-11-20 (×2): 500 mL

## 2020-11-20 MED ORDER — ATORVASTATIN CALCIUM 40 MG PO TABS: 40.0000 mg | ORAL_TABLET | Freq: Every day | ORAL | Status: DC

## 2020-11-20 MED ORDER — SODIUM CHLORIDE 0.9% FLUSH: 3.0000 mL | INTRAVENOUS | Status: DC | PRN

## 2020-11-20 MED ORDER — TAMSULOSIN HCL 0.4 MG PO CAPS
0.4000 mg | ORAL_CAPSULE | Freq: Every day | ORAL | Status: DC
  Administered 2020-11-20 – 2020-11-26 (×6): 0.4 mg via ORAL
  Filled 2020-11-20 (×6): qty 1

## 2020-11-20 MED ORDER — LIDOCAINE HCL (PF) 1 % IJ SOLN
INTRAMUSCULAR | Status: AC
  Filled 2020-11-20: qty 30

## 2020-11-20 MED ORDER — NITROGLYCERIN IN D5W 200-5 MCG/ML-% IV SOLN
2.0000 ug/min | INTRAVENOUS | Status: AC
  Administered 2020-11-21: 5 ug/min via INTRAVENOUS
  Filled 2020-11-20: qty 250

## 2020-11-20 MED ORDER — TRANEXAMIC ACID (OHS) BOLUS VIA INFUSION
15.0000 mg/kg | INTRAVENOUS | Status: AC
  Administered 2020-11-21: 1509 mg via INTRAVENOUS
  Filled 2020-11-20: qty 1509

## 2020-11-20 MED ORDER — SODIUM CHLORIDE 0.9 % IV SOLN: 250.0000 mL | INTRAVENOUS | Status: DC | PRN

## 2020-11-20 MED ORDER — BISACODYL 5 MG PO TBEC: 5.0000 mg | DELAYED_RELEASE_TABLET | Freq: Once | ORAL | Status: DC

## 2020-11-20 MED ORDER — SODIUM CHLORIDE 0.9 % IV SOLN
1.5000 g | INTRAVENOUS | Status: AC
  Administered 2020-11-21: 1.5 g via INTRAVENOUS
  Filled 2020-11-20: qty 1.5

## 2020-11-20 MED ORDER — PANTOPRAZOLE SODIUM 40 MG PO TBEC
40.0000 mg | DELAYED_RELEASE_TABLET | Freq: Every day | ORAL | Status: DC
  Administered 2020-11-20: 40 mg via ORAL
  Filled 2020-11-20: qty 1

## 2020-11-20 MED ORDER — SODIUM CHLORIDE 0.9 % IV SOLN
INTRAVENOUS | Status: DC
  Filled 2020-11-20: qty 30

## 2020-11-20 MED ORDER — POTASSIUM CHLORIDE 2 MEQ/ML IV SOLN
80.0000 meq | INTRAVENOUS | Status: DC
  Filled 2020-11-20: qty 40

## 2020-11-20 MED ORDER — SODIUM CHLORIDE 0.9 % WEIGHT BASED INFUSION
3.0000 mL/kg/h | INTRAVENOUS | Status: DC
  Administered 2020-11-20: 3 mL/kg/h via INTRAVENOUS

## 2020-11-20 MED ORDER — PERFLUTREN LIPID MICROSPHERE
1.0000 mL | INTRAVENOUS | Status: AC | PRN
  Administered 2020-11-20: 2 mL via INTRAVENOUS
  Filled 2020-11-20: qty 10

## 2020-11-20 MED ORDER — PHENYLEPHRINE HCL-NACL 20-0.9 MG/250ML-% IV SOLN
30.0000 ug/min | INTRAVENOUS | Status: AC
  Administered 2020-11-21: 20 ug/min via INTRAVENOUS
  Filled 2020-11-20: qty 250

## 2020-11-20 MED ORDER — MIDAZOLAM HCL 2 MG/2ML IJ SOLN
INTRAMUSCULAR | Status: DC | PRN
  Administered 2020-11-20: 2 mg via INTRAVENOUS

## 2020-11-20 MED ORDER — FENTANYL CITRATE (PF) 100 MCG/2ML IJ SOLN
INTRAMUSCULAR | Status: DC | PRN
  Administered 2020-11-20: 25 ug via INTRAVENOUS

## 2020-11-20 MED ORDER — SODIUM CHLORIDE 0.9% FLUSH
3.0000 mL | Freq: Two times a day (BID) | INTRAVENOUS | Status: DC
  Administered 2020-11-22: 3 mL via INTRAVENOUS

## 2020-11-20 MED ORDER — TEMAZEPAM 15 MG PO CAPS: 15.0000 mg | ORAL_CAPSULE | Freq: Once | ORAL | Status: DC | PRN

## 2020-11-20 MED ORDER — ASPIRIN 81 MG PO CHEW: 81.0000 mg | CHEWABLE_TABLET | ORAL | Status: DC

## 2020-11-20 MED ORDER — IOHEXOL 350 MG/ML SOLN
INTRAVENOUS | Status: DC | PRN
  Administered 2020-11-20: 65 mL

## 2020-11-20 MED ORDER — ACETAMINOPHEN 500 MG PO TABS
1000.0000 mg | ORAL_TABLET | Freq: Once | ORAL | Status: AC
  Administered 2020-11-21: 1000 mg via ORAL
  Filled 2020-11-20: qty 2

## 2020-11-20 MED ORDER — ATORVASTATIN CALCIUM 80 MG PO TABS
80.0000 mg | ORAL_TABLET | Freq: Every day | ORAL | Status: DC
  Administered 2020-11-20 – 2020-11-26 (×6): 80 mg via ORAL
  Filled 2020-11-20 (×6): qty 1

## 2020-11-20 MED ORDER — MILRINONE LACTATE IN DEXTROSE 20-5 MG/100ML-% IV SOLN
0.3000 ug/kg/min | INTRAVENOUS | Status: DC
  Filled 2020-11-20: qty 100

## 2020-11-20 MED ORDER — SODIUM CHLORIDE 0.9 % WEIGHT BASED INFUSION: 1.0000 mL/kg/h | INTRAVENOUS | Status: DC

## 2020-11-20 MED ORDER — ONDANSETRON HCL 4 MG/2ML IJ SOLN: 4.0000 mg | Freq: Four times a day (QID) | INTRAMUSCULAR | Status: DC | PRN

## 2020-11-20 MED ORDER — METOPROLOL SUCCINATE ER 50 MG PO TB24: 50.0000 mg | ORAL_TABLET | Freq: Every day | ORAL | Status: DC

## 2020-11-20 MED ORDER — OMEPRAZOLE MAGNESIUM 20 MG PO TBEC: 20.0000 mg | DELAYED_RELEASE_TABLET | Freq: Every day | ORAL | Status: DC

## 2020-11-20 MED ORDER — CHLORHEXIDINE GLUCONATE CLOTH 2 % EX PADS
6.0000 | MEDICATED_PAD | Freq: Once | CUTANEOUS | Status: AC
  Administered 2020-11-20: 6 via TOPICAL

## 2020-11-20 MED ORDER — TRAZODONE HCL 50 MG PO TABS
50.0000 mg | ORAL_TABLET | Freq: Every evening | ORAL | Status: DC | PRN
  Administered 2020-11-20: 50 mg via ORAL
  Filled 2020-11-20: qty 1

## 2020-11-20 MED ORDER — VERAPAMIL HCL 2.5 MG/ML IV SOLN
INTRAVENOUS | Status: AC
  Filled 2020-11-20: qty 2

## 2020-11-20 SURGICAL SUPPLY — 10 items
CATH INFINITI JR4 5F (CATHETERS) ×1 IMPLANT
CATH OPTITORQUE TIG 4.0 5F (CATHETERS) ×1 IMPLANT
DEVICE RAD COMP TR BAND LRG (VASCULAR PRODUCTS) ×1 IMPLANT
GLIDESHEATH SLEND SS 6F .021 (SHEATH) ×1 IMPLANT
GUIDEWIRE INQWIRE 1.5J.035X260 (WIRE) IMPLANT
INQWIRE 1.5J .035X260CM (WIRE) ×2
KIT HEART LEFT (KITS) ×2 IMPLANT
PACK CARDIAC CATHETERIZATION (CUSTOM PROCEDURE TRAY) ×2 IMPLANT
TRANSDUCER W/STOPCOCK (MISCELLANEOUS) ×2 IMPLANT
TUBING CIL FLEX 10 FLL-RA (TUBING) ×2 IMPLANT

## 2020-11-20 NOTE — Progress Notes (Signed)
  Echocardiogram 2D Echocardiogram with contrast has been performed.  Martin [name] F 11/20/2020, 5:27 PM

## 2020-11-20 NOTE — Anesthesia Preprocedure Evaluation (Addendum)
Anesthesia Evaluation  Patient identified by [name], date of birth, ID band Patient awake    Reviewed: Allergy & Precautions, NPO status , Patient's Chart, lab work & pertinent test results  Airway Mallampati: II  TM Distance: >3 FB Neck ROM: Full    Dental  (+) Dental Advisory Given   Pulmonary neg pulmonary ROS,    breath sounds clear to auscultation       Cardiovascular hypertension, Pt. on medications and Pt. on home beta blockers + angina + CAD and + Cardiac Stents   Rhythm:Regular Rate:Normal  Normal EF. LVH mild concentric. Valves WNL.   Neuro/Psych negative neurological ROS     GI/Hepatic Neg liver ROS, PUD, GERD  ,  Endo/Other  negative endocrine ROS  Renal/GU negative Renal ROS     Musculoskeletal   Abdominal   Peds  Hematology  (+) anemia ,   Anesthesia Other Findings   Reproductive/Obstetrics                            Lab Results  Component Value Date   WBC 7.0 11/20/2020   HGB 11.0 (L) 11/20/2020   HCT 34.3 (L) 11/20/2020   MCV 93.7 11/20/2020   PLT 254 11/20/2020   Lab Results  Component Value Date   CREATININE 0.97 11/20/2020   BUN 20 11/15/2020   NA 139 11/15/2020   K 4.4 11/15/2020   CL 100 11/15/2020   CO2 23 11/15/2020    Anesthesia Physical Anesthesia Plan  ASA: IV  Anesthesia Plan: General   Post-op Pain Management:    Induction: Intravenous  PONV Risk Score and Plan: 2 and Dexamethasone, Ondansetron, Treatment may vary due to age or medical condition and Midazolam  Airway Management Planned: Oral ETT  Additional Equipment: Arterial line, CVP, PA Cath, TEE and Ultrasound Guidance Line Placement  Intra-op Plan:   Post-operative Plan: Post-operative intubation/ventilation  Informed Consent: I have reviewed the patients History and Physical, chart, labs and discussed the procedure including the risks, benefits and alternatives for the proposed  anesthesia with the patient or authorized representative who has indicated his/her understanding and acceptance.     Dental advisory given  Plan Discussed with: CRNA  Anesthesia Plan Comments:        Anesthesia Quick Evaluation

## 2020-11-20 NOTE — Consult Note (Signed)
[address]       [place] [address]             [phone]        MAXTON NOREEN [facility] Medical Record #[id number] Date of Birth: 01/25/1946  Referring: Tresa Endo Primary Care: [name], MD Primary Cardiologist:James Hochrein, MD  Chief Complaint:  CAD, Requesting CABG evaluation  History of Present Illness:      Mr. Gang is a 75 yo male with known history of GERD, UC, BPH, and known CAD. This dates back to 2008 when he was found to have a significant stenosis in his RCA.  He was treated with DES at that time.  However, the stent acutely thrombosed the same day.  He subsequently had 3 additional stents placed at that time.  He has been routinely followed by Dr. Antoine Poche since that time.  He presented for his 1 year follow up on 11/02/2020.  At that visit the patient admitted to experiencing fatigue.  He felt this was possibly related to his hormone therapy he was taking previously for previous prostate cancer. The patient admitted to getting tired even doing normal every day activities.  He can only work for about 10 minutes before he has to stop and rest.  He occasional experiences some minor chest discomfort/tightness with shortness of breath if the work is very strenuous.  He underwent stress testing in 2015 and 2020 both of which were negative for ischemia.  It was felt the patient was likely suffering angina due to obstructive CAD.  He was set up for stress testing which was performed on 11/08/2020.  Ultimately this was abnormal with multiple areas of ischemia and partially reversible defects.  His EF was mildly reduced.  He presented to [facility] today for elective outpatient cardiac catheterization.  This showed multivessel CAD.  He is being admitted and TCTS consult has been requested for possible coronary bypass grafting procedure.  Currently the patient is chest pain free.  He admits to really experiencing fatigue over the past 3 -4 months.  He  states he was previously a very active guy and with his job would walk several miles per day.  He is a non-smoker.  He denies history of diabetes.  He has had his rotator cuff repaired and post operatively he suffered a PE.  The only other surgical procedure he has had was placement of his radioactive seeds for his prostate cancer.  Current Activity/ Functional Status: Patient is independent with mobility/ambulation, transfers, ADL's, IADL's.   Zubrod Score: At the time of surgery this patient's most appropriate activity status/level should be described as: []     0    Normal activity, no symptoms [x]     1    Restricted in physical strenuous activity but ambulatory, able to do out light work []     2    Ambulatory and capable of self care, unable to do work activities, up and about                 more than 50%  Of the time                            []     3    Only limited self care, in bed greater than 50% of waking hours []     4    Completely disabled, no self care, confined to bed or chair []   5    Moribund  Past Medical History:  Diagnosis Date  . Benign prostatic hypertrophy   . Coronary artery disease    cath 04/2007 with 90-95% RCA stenosis. Had DES placed.  Had an acute stent thrombosis in the hospital that day and subsequently had 3 DES placed in the RCA. The LAD had 45% stenosis, in the circ had 35% stenosis. The EF was well preserved.  . Dyslipidemia   . GERD (gastroesophageal reflux disease)   . Ulcerative colitis     Past Surgical History:  Procedure Laterality Date  . INGUINAL HERNIA REPAIR  1999  . ROTATOR CUFF REPAIR Left     Social History   Tobacco Use  Smoking Status Never Smoker  Smokeless Tobacco Never Used    Social History   Substance and Sexual Activity  Alcohol Use No     Allergies  Allergen Reactions  . Morphine     Hot, sweats, flushed    Current Facility-Administered Medications  Medication Dose Route Frequency Provider Last Rate Last  Admin  . 0.9 %  sodium chloride infusion  250 mL Intravenous PRN Rollene Rotunda, MD      . 0.9% sodium chloride infusion  1 mL/kg/hr Intravenous Continuous Rollene Rotunda, MD 102.1 mL/hr at 11/20/20 1014 1 mL/kg/hr at 11/20/20 1014  . aspirin chewable tablet 81 mg  81 mg Oral Pre-Cath Hochrein, James, MD      . sodium chloride flush (NS) 0.9 % injection 3 mL  3 mL Intravenous Q12H Hochrein, James, MD      . sodium chloride flush (NS) 0.9 % injection 3 mL  3 mL Intravenous PRN Rollene Rotunda, MD        Medications Prior to Admission  Medication Sig Dispense Refill Last Dose  . ASACOL HD 800 MG TBEC Take 800 mg by mouth 3 (three) times daily.  3 11/20/2020 at 0700  . aspirin 81 MG tablet Take 81 mg by mouth daily.   11/20/2020 at 0700  . atorvastatin (LIPITOR) 40 MG tablet Take 1 tablet (40 mg total) by mouth daily at 6 PM. 90 tablet 3 11/19/2020 at Unknown time  . loratadine (CLARITIN) 10 MG tablet Take 10 mg by mouth daily.   11/20/2020 at 0700  . metoprolol succinate (TOPROL-XL) 50 MG 24 hr tablet TAKE 1 TABLET (50 MG TOTAL) BY MOUTH DAILY. TAKE WITH OR IMMEDIATELY FOLLOWING A MEAL. 90 tablet 3 11/20/2020 at 0700  . nitroGLYCERIN (NITROSTAT) 0.4 MG SL tablet Place 0.4 mg under the tongue every 5 (five) minutes as needed for chest pain.   Past Week at Unknown time  . omeprazole (PRILOSEC OTC) 20 MG tablet Take 20 mg by mouth daily.   11/20/2020 at 0700  . tamsulosin (FLOMAX) 0.4 MG CAPS capsule Take 0.4 mg by mouth daily.   11/19/2020 at Unknown time  . traZODone (DESYREL) 50 MG tablet Take 50 mg by mouth at bedtime as needed for sleep.   11/19/2020 at Unknown time  . sildenafil (VIAGRA) 50 MG tablet Take 1 tablet (50 mg total) by mouth as needed for erectile dysfunction. (Patient not taking: Reported on 11/14/2020) 10 tablet 3 Not Taking at Unknown time    Family History  Problem Relation Age of Onset  . Heart attack Mother   . Heart attack Father   . Heart attack Brother     Review of  Systems:     Cardiac Review of Systems: Y or  [    ]= no  Chest Pain [  Y  ]  Resting SOB [   ] Exertional SOB  [Y  ]  Orthopnea [  ]   Pedal Edema [   ]    Palpitations [  ] Syncope  [  ]   Presyncope [   ]  General Review of Systems: [Y] = yes [  ]=no Constitional: recent weight change [  ]; anorexia [  ]; fatigue [Y  ]; nausea [  ]; night sweats [  ]; fever [  ]; or chills [  ]                                                               Dental: Last Dentist visit:   Eye : blurred vision [  ]; diplopia [   ]; vision changes [  ];  Amaurosis fugax[  ]; Resp: cough [  ];  wheezing[  ];  hemoptysis[  ]; shortness of breath[  ]; paroxysmal nocturnal dyspnea[  ]; dyspnea on exertion[ Y ]; or orthopnea[  ];  GI:  gallstones[  ], vomiting[  ];  dysphagia[  ]; melena[  ];  hematochezia [  ]; heartburn[  ];   Hx of  Colonoscopy[  ]; GU: kidney stones [  ]; hematuria[  ];   dysuria [  ];  nocturia[  ];  history of     obstruction [  ]; urinary frequency [  ]             Skin: rash, swelling[  ];, hair loss[  ];  peripheral edema[ N ];  or itching[  ]; Musculosketetal: myalgias[  ];  joint swelling[  ];  joint erythema[  ];  joint pain[  ];  back pain[  ];  Heme/Lymph: bruising[  ];  bleeding[  ];  anemia[  ];  Neuro: TIA[  ];  headaches[  ];  stroke[ N ];  vertigo[  ];  seizures[  ];   paresthesias[  ];  difficulty walking[  ];  Psych:depression[  ]; anxiety[  ];  Endocrine: diabetes[N  ];  thyroid dysfunction[  ];  Physical Exam: BP (!) 174/49   Pulse (!) 48   Temp 98.5 F (36.9 C) (Oral)   Resp 18   Ht 6\' 1"  (1.854 m)   Wt 102.1 kg   SpO2 97%   BMI 29.69 kg/m    General appearance: alert, cooperative and no distress Head: Normocephalic, without obvious abnormality, atraumatic Neck: no adenopathy, no carotid bruit, no JVD, supple, symmetrical, trachea midline and thyroid not enlarged, symmetric, no tenderness/mass/nodules Resp: clear to auscultation bilaterally Cardio: regular rate  and rhythm, S1, S2 normal, no murmur, click, rub or gallop GI: soft, non-tender; bowel sounds normal; no masses,  no organomegaly Extremities: extremities normal, atraumatic, no cyanosis or edema Neurologic: Grossly normal    I have independently reviewed the above radiologic studies and discussed with the patient   Recent Lab Findings: Lab Results  Component Value Date   WBC 9.4 11/15/2020   HGB 11.9 (L) 11/15/2020   HCT 35.6 (L) 11/15/2020   PLT 298 11/15/2020   GLUCOSE 92 11/15/2020   CHOL 161 08/14/2009   TRIG 158.0 (H) 08/14/2009   HDL 38.90 (L) 08/14/2009   LDLCALC 91 08/14/2009   ALT 30 08/14/2009  AST 27 08/14/2009   NA 139 11/15/2020   K 4.4 11/15/2020   CL 100 11/15/2020   CREATININE 1.16 11/15/2020   BUN 20 11/15/2020   CO2 23 11/15/2020   TSH 1.46 08/14/2009    Assessment / Plan:    1. Known CAD, previous DES x 3 placed in 2008.  Patient now with multivessel CAD, requesting CABG, tentatively for tomorrow with Dr. Vickey Sages 2. Dyslipidemia 3. GERD 4. H/O Prostate Cancer 5. Dispo- patient stable, currently chest pain free, no shortness of breath, agreeable to proceed with CABG.  Dr. Vickey Sages will follow up with further recommendations.  For likely bypass procedure in the AM  I  spent 55 minutes counseling the patient face to face.   Lowella Dandy, PA-C  11/20/2020 1:46 PM

## 2020-11-20 NOTE — Progress Notes (Signed)
Requested CT surgery consult for possible CABG.

## 2020-11-20 NOTE — Progress Notes (Signed)
Pre-CABG study completed.  ° °Please see CV Proc for preliminary results.  ° °Julieanne Hadsall, RDMS, RVT ° °

## 2020-11-21 ENCOUNTER — Inpatient Hospital Stay (HOSPITAL_COMMUNITY): Payer: Medicare PPO

## 2020-11-21 ENCOUNTER — Inpatient Hospital Stay (HOSPITAL_COMMUNITY): Payer: Medicare PPO | Admitting: Anesthesiology

## 2020-11-21 ENCOUNTER — Inpatient Hospital Stay (HOSPITAL_COMMUNITY)
Admission: AD | Disposition: A | Discharge status: Home Health | Payer: Self-pay | Source: Home / Self Care | Attending: Cardiothoracic Surgery

## 2020-11-21 ENCOUNTER — Encounter (HOSPITAL_COMMUNITY): Payer: Self-pay | Admitting: Cardiovascular Disease

## 2020-11-21 HISTORY — PX: TEE WITHOUT CARDIOVERSION: SHX5443

## 2020-11-21 HISTORY — PX: CORONARY ARTERY BYPASS GRAFT: SHX141

## 2020-11-21 HISTORY — PX: RADIAL ARTERY HARVEST: SHX5067

## 2020-11-21 LAB — POCT I-STAT, CHEM 8
BUN: 22 mg/dL (ref 8–23)
BUN: 21 mg/dL (ref 8–23)
BUN: 20 mg/dL (ref 8–23)
BUN: 21 mg/dL (ref 8–23)
BUN: 22 mg/dL (ref 8–23)
Calcium, Ion: 1.27 mmol/L (ref 1.15–1.40)
Calcium, Ion: 1.29 mmol/L (ref 1.15–1.40)
Calcium, Ion: 1.16 mmol/L (ref 1.15–1.40)
Calcium, Ion: 1.19 mmol/L (ref 1.15–1.40)
Calcium, Ion: 1.21 mmol/L (ref 1.15–1.40)
Chloride: 105 mmol/L (ref 98–111)
Chloride: 104 mmol/L (ref 98–111)
Chloride: 111 mmol/L (ref 98–111)
Chloride: 106 mmol/L (ref 98–111)
Chloride: 107 mmol/L (ref 98–111)
Creatinine, Ser: 0.9 mg/dL (ref 0.61–1.24)
Creatinine, Ser: 0.9 mg/dL (ref 0.61–1.24)
Creatinine, Ser: 0.9 mg/dL (ref 0.61–1.24)
Creatinine, Ser: 0.8 mg/dL (ref 0.61–1.24)
Creatinine, Ser: 0.9 mg/dL (ref 0.61–1.24)
Glucose, Bld: 118 mg/dL — ABNORMAL HIGH (ref 70–99)
Glucose, Bld: 128 mg/dL — ABNORMAL HIGH (ref 70–99)
Glucose, Bld: 114 mg/dL — ABNORMAL HIGH (ref 70–99)
Glucose, Bld: 111 mg/dL — ABNORMAL HIGH (ref 70–99)
Glucose, Bld: 120 mg/dL — ABNORMAL HIGH (ref 70–99)
HCT: 26 % — ABNORMAL LOW (ref 39.0–52.0)
HCT: 24 % — ABNORMAL LOW (ref 39.0–52.0)
HCT: 20 % — ABNORMAL LOW (ref 39.0–52.0)
HCT: 22 % — ABNORMAL LOW (ref 39.0–52.0)
HCT: 25 % — ABNORMAL LOW (ref 39.0–52.0)
Hemoglobin: 8.8 g/dL — ABNORMAL LOW (ref 13.0–17.0)
Hemoglobin: 8.2 g/dL — ABNORMAL LOW (ref 13.0–17.0)
Hemoglobin: 6.8 g/dL — CL (ref 13.0–17.0)
Hemoglobin: 7.5 g/dL — ABNORMAL LOW (ref 13.0–17.0)
Hemoglobin: 8.5 g/dL — ABNORMAL LOW (ref 13.0–17.0)
Potassium: 3.8 mmol/L (ref 3.5–5.1)
Potassium: 3.9 mmol/L (ref 3.5–5.1)
Potassium: 4.9 mmol/L (ref 3.5–5.1)
Potassium: 4.8 mmol/L (ref 3.5–5.1)
Potassium: 5.1 mmol/L (ref 3.5–5.1)
Sodium: 141 mmol/L (ref 135–145)
Sodium: 140 mmol/L (ref 135–145)
Sodium: 139 mmol/L (ref 135–145)
Sodium: 139 mmol/L (ref 135–145)
Sodium: 141 mmol/L (ref 135–145)
TCO2: 26 mmol/L (ref 22–32)
TCO2: 24 mmol/L (ref 22–32)
TCO2: 28 mmol/L (ref 22–32)
TCO2: 24 mmol/L (ref 22–32)
TCO2: 26 mmol/L (ref 22–32)

## 2020-11-21 LAB — GLUCOSE, CAPILLARY
Glucose-Capillary: 105 mg/dL — ABNORMAL HIGH (ref 70–99)
Glucose-Capillary: 95 mg/dL (ref 70–99)
Glucose-Capillary: 93 mg/dL (ref 70–99)
Glucose-Capillary: 111 mg/dL — ABNORMAL HIGH (ref 70–99)
Glucose-Capillary: 98 mg/dL (ref 70–99)
Glucose-Capillary: 121 mg/dL — ABNORMAL HIGH (ref 70–99)
Glucose-Capillary: 122 mg/dL — ABNORMAL HIGH (ref 70–99)
Glucose-Capillary: 111 mg/dL — ABNORMAL HIGH (ref 70–99)

## 2020-11-21 LAB — POCT I-STAT EG7
Acid-Base Excess: 0 mmol/L (ref 0.0–2.0)
Bicarbonate: 25.6 mmol/L (ref 20.0–28.0)
Calcium, Ion: 1.18 mmol/L (ref 1.15–1.40)
HCT: 22 % — ABNORMAL LOW (ref 39.0–52.0)
Hemoglobin: 7.5 g/dL — ABNORMAL LOW (ref 13.0–17.0)
O2 Saturation: 82 %
Potassium: 5.2 mmol/L — ABNORMAL HIGH (ref 3.5–5.1)
Sodium: 141 mmol/L (ref 135–145)
TCO2: 27 mmol/L (ref 22–32)
pCO2, Ven: 45.8 mmHg (ref 44.0–60.0)
pH, Ven: 7.354 (ref 7.250–7.430)
pO2, Ven: 49 mmHg — ABNORMAL HIGH (ref 32.0–45.0)

## 2020-11-21 LAB — CBC
HCT: 29.5 % — ABNORMAL LOW (ref 39.0–52.0)
HCT: 30.7 % — ABNORMAL LOW (ref 39.0–52.0)
HCT: 32.1 % — ABNORMAL LOW (ref 39.0–52.0)
Hemoglobin: 9.8 g/dL — ABNORMAL LOW (ref 13.0–17.0)
Hemoglobin: 10.1 g/dL — ABNORMAL LOW (ref 13.0–17.0)
Hemoglobin: 10.2 g/dL — ABNORMAL LOW (ref 13.0–17.0)
MCH: 30.8 pg (ref 26.0–34.0)
MCH: 30.2 pg (ref 26.0–34.0)
MCH: 29.7 pg (ref 26.0–34.0)
MCHC: 33.2 g/dL (ref 30.0–36.0)
MCHC: 32.9 g/dL (ref 30.0–36.0)
MCHC: 31.8 g/dL (ref 30.0–36.0)
MCV: 92.8 fL (ref 80.0–100.0)
MCV: 91.9 fL (ref 80.0–100.0)
MCV: 93.3 fL (ref 80.0–100.0)
Platelets: 230 10*3/uL (ref 150–400)
Platelets: 170 10*3/uL (ref 150–400)
Platelets: 196 10*3/uL (ref 150–400)
RBC: 3.18 MIL/uL — ABNORMAL LOW (ref 4.22–5.81)
RBC: 3.34 MIL/uL — ABNORMAL LOW (ref 4.22–5.81)
RBC: 3.44 MIL/uL — ABNORMAL LOW (ref 4.22–5.81)
RDW: 12.9 % (ref 11.5–15.5)
RDW: 13.7 % (ref 11.5–15.5)
RDW: 14.5 % (ref 11.5–15.5)
WBC: 6.3 10*3/uL (ref 4.0–10.5)
WBC: 10.2 10*3/uL (ref 4.0–10.5)
WBC: 10.7 10*3/uL — ABNORMAL HIGH (ref 4.0–10.5)
nRBC: 0 % (ref 0.0–0.2)
nRBC: 0 % (ref 0.0–0.2)
nRBC: 0 % (ref 0.0–0.2)

## 2020-11-21 LAB — BASIC METABOLIC PANEL
Anion gap: 7 (ref 5–15)
BUN: 20 mg/dL (ref 8–23)
CO2: 21 mmol/L — ABNORMAL LOW (ref 22–32)
Calcium: 8.3 mg/dL — ABNORMAL LOW (ref 8.9–10.3)
Chloride: 110 mmol/L (ref 98–111)
Creatinine, Ser: 0.98 mg/dL (ref 0.61–1.24)
GFR, Estimated: >60 mL/min (ref >60)
Glucose, Bld: 122 mg/dL — ABNORMAL HIGH (ref 70–99)
Potassium: 4.3 mmol/L (ref 3.5–5.1)
Sodium: 138 mmol/L (ref 135–145)

## 2020-11-21 LAB — POCT I-STAT 7, (LYTES, BLD GAS, ICA,H+H)
Acid-Base Excess: 1 mmol/L (ref 0.0–2.0)
Acid-Base Excess: 2 mmol/L (ref 0.0–2.0)
Acid-Base Excess: 0 mmol/L (ref 0.0–2.0)
Acid-base deficit: 2 mmol/L (ref 0.0–2.0)
Acid-base deficit: 3 mmol/L — ABNORMAL HIGH (ref 0.0–2.0)
Acid-base deficit: 4 mmol/L — ABNORMAL HIGH (ref 0.0–2.0)
Acid-base deficit: 5 mmol/L — ABNORMAL HIGH (ref 0.0–2.0)
Bicarbonate: 26.4 mmol/L (ref 20.0–28.0)
Bicarbonate: 27.4 mmol/L (ref 20.0–28.0)
Bicarbonate: 25.7 mmol/L (ref 20.0–28.0)
Bicarbonate: 23.4 mmol/L (ref 20.0–28.0)
Bicarbonate: 23.1 mmol/L (ref 20.0–28.0)
Bicarbonate: 21.5 mmol/L (ref 20.0–28.0)
Bicarbonate: 21.3 mmol/L (ref 20.0–28.0)
Calcium, Ion: 1.29 mmol/L (ref 1.15–1.40)
Calcium, Ion: 1.04 mmol/L — ABNORMAL LOW (ref 1.15–1.40)
Calcium, Ion: 1.16 mmol/L (ref 1.15–1.40)
Calcium, Ion: 1.2 mmol/L (ref 1.15–1.40)
Calcium, Ion: 1.26 mmol/L (ref 1.15–1.40)
Calcium, Ion: 1.25 mmol/L (ref 1.15–1.40)
Calcium, Ion: 1.23 mmol/L (ref 1.15–1.40)
HCT: 27 % — ABNORMAL LOW (ref 39.0–52.0)
HCT: 20 % — ABNORMAL LOW (ref 39.0–52.0)
HCT: 26 % — ABNORMAL LOW (ref 39.0–52.0)
HCT: 26 % — ABNORMAL LOW (ref 39.0–52.0)
HCT: 29 % — ABNORMAL LOW (ref 39.0–52.0)
HCT: 29 % — ABNORMAL LOW (ref 39.0–52.0)
HCT: 30 % — ABNORMAL LOW (ref 39.0–52.0)
Hemoglobin: 9.2 g/dL — ABNORMAL LOW (ref 13.0–17.0)
Hemoglobin: 6.8 g/dL — CL (ref 13.0–17.0)
Hemoglobin: 8.8 g/dL — ABNORMAL LOW (ref 13.0–17.0)
Hemoglobin: 8.8 g/dL — ABNORMAL LOW (ref 13.0–17.0)
Hemoglobin: 9.9 g/dL — ABNORMAL LOW (ref 13.0–17.0)
Hemoglobin: 9.9 g/dL — ABNORMAL LOW (ref 13.0–17.0)
Hemoglobin: 10.2 g/dL — ABNORMAL LOW (ref 13.0–17.0)
O2 Saturation: 100 %
O2 Saturation: 100 %
O2 Saturation: 100 %
O2 Saturation: 97 %
O2 Saturation: 99 %
O2 Saturation: 94 %
O2 Saturation: 95 %
Patient temperature: 36.1
Patient temperature: 36.6
Patient temperature: 37
Potassium: 3.8 mmol/L (ref 3.5–5.1)
Potassium: 4 mmol/L (ref 3.5–5.1)
Potassium: 5.3 mmol/L — ABNORMAL HIGH (ref 3.5–5.1)
Potassium: 4.8 mmol/L (ref 3.5–5.1)
Potassium: 4.7 mmol/L (ref 3.5–5.1)
Potassium: 4.4 mmol/L (ref 3.5–5.1)
Potassium: 4.3 mmol/L (ref 3.5–5.1)
Sodium: 140 mmol/L (ref 135–145)
Sodium: 141 mmol/L (ref 135–145)
Sodium: 140 mmol/L (ref 135–145)
Sodium: 141 mmol/L (ref 135–145)
Sodium: 140 mmol/L (ref 135–145)
Sodium: 140 mmol/L (ref 135–145)
Sodium: 140 mmol/L (ref 135–145)
TCO2: 28 mmol/L (ref 22–32)
TCO2: 29 mmol/L (ref 22–32)
TCO2: 27 mmol/L (ref 22–32)
TCO2: 25 mmol/L (ref 22–32)
TCO2: 24 mmol/L (ref 22–32)
TCO2: 23 mmol/L (ref 22–32)
TCO2: 23 mmol/L (ref 22–32)
pCO2 arterial: 46.7 mmHg (ref 32.0–48.0)
pCO2 arterial: 47.4 mmHg (ref 32.0–48.0)
pCO2 arterial: 45.9 mmHg (ref 32.0–48.0)
pCO2 arterial: 39.8 mmHg (ref 32.0–48.0)
pCO2 arterial: 41.4 mmHg (ref 32.0–48.0)
pCO2 arterial: 40.1 mmHg (ref 32.0–48.0)
pCO2 arterial: 43.3 mmHg (ref 32.0–48.0)
pH, Arterial: 7.36 (ref 7.350–7.450)
pH, Arterial: 7.37 (ref 7.350–7.450)
pH, Arterial: 7.356 (ref 7.350–7.450)
pH, Arterial: 7.377 (ref 7.350–7.450)
pH, Arterial: 7.351 (ref 7.350–7.450)
pH, Arterial: 7.335 — ABNORMAL LOW (ref 7.350–7.450)
pH, Arterial: 7.301 — ABNORMAL LOW (ref 7.350–7.450)
pO2, Arterial: 446 mmHg — ABNORMAL HIGH (ref 83.0–108.0)
pO2, Arterial: 413 mmHg — ABNORMAL HIGH (ref 83.0–108.0)
pO2, Arterial: 417 mmHg — ABNORMAL HIGH (ref 83.0–108.0)
pO2, Arterial: 88 mmHg (ref 83.0–108.0)
pO2, Arterial: 161 mmHg — ABNORMAL HIGH (ref 83.0–108.0)
pO2, Arterial: 75 mmHg — ABNORMAL LOW (ref 83.0–108.0)
pO2, Arterial: 81 mmHg — ABNORMAL LOW (ref 83.0–108.0)

## 2020-11-21 LAB — COMPREHENSIVE METABOLIC PANEL
ALT: 18 U/L (ref 0–44)
AST: 18 U/L (ref 15–41)
Albumin: 2.7 g/dL — ABNORMAL LOW (ref 3.5–5.0)
Alkaline Phosphatase: 75 U/L (ref 38–126)
Anion gap: 7 (ref 5–15)
BUN: 21 mg/dL (ref 8–23)
CO2: 23 mmol/L (ref 22–32)
Calcium: 8.6 mg/dL — ABNORMAL LOW (ref 8.9–10.3)
Chloride: 107 mmol/L (ref 98–111)
Creatinine, Ser: 1.1 mg/dL (ref 0.61–1.24)
GFR, Estimated: >60 mL/min (ref >60)
Glucose, Bld: 113 mg/dL — ABNORMAL HIGH (ref 70–99)
Potassium: 3.8 mmol/L (ref 3.5–5.1)
Sodium: 137 mmol/L (ref 135–145)
Total Bilirubin: 1 mg/dL (ref 0.3–1.2)
Total Protein: 5.4 g/dL — ABNORMAL LOW (ref 6.5–8.1)

## 2020-11-21 LAB — PREPARE RBC (CROSSMATCH)

## 2020-11-21 LAB — SURGICAL PCR SCREEN
MRSA, PCR: NEGATIVE
Staphylococcus aureus: NEGATIVE

## 2020-11-21 LAB — PROTIME-INR
INR: 1.1 (ref 0.8–1.2)
INR: 1.3 — ABNORMAL HIGH (ref 0.8–1.2)
Prothrombin Time: 13.3 seconds (ref 11.4–15.2)
Prothrombin Time: 15.7 seconds — ABNORMAL HIGH (ref 11.4–15.2)

## 2020-11-21 LAB — HEMOGLOBIN AND HEMATOCRIT, BLOOD
HCT: 26.1 % — ABNORMAL LOW (ref 39.0–52.0)
Hemoglobin: 8.4 g/dL — ABNORMAL LOW (ref 13.0–17.0)

## 2020-11-21 LAB — PLATELET COUNT: Platelets: 196 10*3/uL (ref 150–400)

## 2020-11-21 LAB — HEMOGLOBIN A1C
Hgb A1c MFr Bld: 5.9 % — ABNORMAL HIGH (ref 4.8–5.6)
Mean Plasma Glucose: 122.63 mg/dL

## 2020-11-21 LAB — APTT
aPTT: 28 seconds (ref 24–36)
aPTT: 30 seconds (ref 24–36)

## 2020-11-21 LAB — MAGNESIUM: Magnesium: 2.7 mg/dL — ABNORMAL HIGH (ref 1.7–2.4)

## 2020-11-21 SURGERY — CORONARY ARTERY BYPASS GRAFTING (CABG)
Anesthesia: General | Site: Chest

## 2020-11-21 MED ORDER — METOPROLOL TARTRATE 25 MG/10 ML ORAL SUSPENSION: 12.5000 mg | Freq: Two times a day (BID) | ORAL | Status: DC

## 2020-11-21 MED ORDER — OXYCODONE HCL 5 MG PO TABS
5.0000 mg | ORAL_TABLET | ORAL | Status: DC | PRN
  Administered 2020-11-22 (×2): 10 mg via ORAL
  Administered 2020-11-22: 5 mg via ORAL
  Administered 2020-11-23: 10 mg via ORAL
  Filled 2020-11-21: qty 1
  Filled 2020-11-21 (×3): qty 2

## 2020-11-21 MED ORDER — LEVALBUTEROL HCL 1.25 MG/0.5ML IN NEBU
1.2500 mg | INHALATION_SOLUTION | Freq: Four times a day (QID) | RESPIRATORY_TRACT | Status: DC
  Administered 2020-11-21: 1.25 mg via RESPIRATORY_TRACT
  Filled 2020-11-21: qty 0.5

## 2020-11-21 MED ORDER — ONDANSETRON HCL 4 MG/2ML IJ SOLN
4.0000 mg | Freq: Four times a day (QID) | INTRAMUSCULAR | Status: DC | PRN
  Administered 2020-11-22 – 2020-11-24 (×3): 4 mg via INTRAVENOUS
  Filled 2020-11-21 (×3): qty 2

## 2020-11-21 MED ORDER — DOCUSATE SODIUM 100 MG PO CAPS
200.0000 mg | ORAL_CAPSULE | Freq: Every day | ORAL | Status: DC
  Administered 2020-11-22 – 2020-11-26 (×4): 200 mg via ORAL
  Filled 2020-11-21 (×5): qty 2

## 2020-11-21 MED ORDER — NITROGLYCERIN IN D5W 200-5 MCG/ML-% IV SOLN: 7.0000 ug/min | INTRAVENOUS | Status: DC

## 2020-11-21 MED ORDER — STERILE WATER FOR INJECTION IJ SOLN
INTRAMUSCULAR | Status: AC
  Filled 2020-11-21: qty 10

## 2020-11-21 MED ORDER — ROCURONIUM BROMIDE 10 MG/ML (PF) SYRINGE
PREFILLED_SYRINGE | INTRAVENOUS | Status: DC | PRN
  Administered 2020-11-21: 40 mg via INTRAVENOUS
  Administered 2020-11-21: 60 mg via INTRAVENOUS
  Administered 2020-11-21 (×2): 50 mg via INTRAVENOUS

## 2020-11-21 MED ORDER — BUPIVACAINE LIPOSOME 1.3 % IJ SUSP
INTRAMUSCULAR | Status: AC
  Filled 2020-11-21: qty 20

## 2020-11-21 MED ORDER — ACETAMINOPHEN 160 MG/5ML PO SOLN: 650.0000 mg | Freq: Once | ORAL | Status: AC

## 2020-11-21 MED ORDER — MAGNESIUM SULFATE 4 GM/100ML IV SOLN
4.0000 g | Freq: Once | INTRAVENOUS | Status: AC
  Administered 2020-11-21: 4 g via INTRAVENOUS
  Filled 2020-11-21: qty 100

## 2020-11-21 MED ORDER — LACTATED RINGERS IV SOLN: INTRAVENOUS | Status: DC | PRN

## 2020-11-21 MED ORDER — LACTATED RINGERS IV SOLN: INTRAVENOUS | Status: DC

## 2020-11-21 MED ORDER — DEXTROSE 50 % IV SOLN: 0.0000 mL | INTRAVENOUS | Status: DC | PRN

## 2020-11-21 MED ORDER — FENTANYL CITRATE (PF) 100 MCG/2ML IJ SOLN
INTRAMUSCULAR | Status: AC
  Filled 2020-11-21: qty 2

## 2020-11-21 MED ORDER — METOCLOPRAMIDE HCL 5 MG/ML IJ SOLN
10.0000 mg | Freq: Four times a day (QID) | INTRAMUSCULAR | Status: AC
  Administered 2020-11-21 – 2020-11-22 (×4): 10 mg via INTRAVENOUS
  Filled 2020-11-21 (×3): qty 2

## 2020-11-21 MED ORDER — SODIUM CHLORIDE 0.9% FLUSH
10.0000 mL | Freq: Two times a day (BID) | INTRAVENOUS | Status: DC
  Administered 2020-11-21 – 2020-11-22 (×2): 10 mL

## 2020-11-21 MED ORDER — CHLORHEXIDINE GLUCONATE 0.12% ORAL RINSE (MEDLINE KIT): 15.0000 mL | Freq: Two times a day (BID) | OROMUCOSAL | Status: DC

## 2020-11-21 MED ORDER — ORAL CARE MOUTH RINSE
15.0000 mL | Freq: Two times a day (BID) | OROMUCOSAL | Status: DC
  Administered 2020-11-22 – 2020-11-23 (×3): 15 mL via OROMUCOSAL

## 2020-11-21 MED ORDER — PANTOPRAZOLE SODIUM 40 MG PO TBEC
40.0000 mg | DELAYED_RELEASE_TABLET | Freq: Every day | ORAL | Status: DC
  Administered 2020-11-23 – 2020-11-26 (×4): 40 mg via ORAL
  Filled 2020-11-21 (×4): qty 1

## 2020-11-21 MED ORDER — FAMOTIDINE IN NACL 20-0.9 MG/50ML-% IV SOLN
20.0000 mg | Freq: Two times a day (BID) | INTRAVENOUS | Status: AC
  Administered 2020-11-21: 20 mg via INTRAVENOUS

## 2020-11-21 MED ORDER — HEMOSTATIC AGENTS (NO CHARGE) OPTIME
TOPICAL | Status: DC | PRN
  Administered 2020-11-21 (×2): 1 via TOPICAL

## 2020-11-21 MED ORDER — FENTANYL CITRATE (PF) 100 MCG/2ML IJ SOLN
INTRAMUSCULAR | Status: DC | PRN
  Administered 2020-11-21: 250 ug via INTRAVENOUS
  Administered 2020-11-21: 50 ug via INTRAVENOUS
  Administered 2020-11-21: 150 ug via INTRAVENOUS
  Administered 2020-11-21: 100 ug via INTRAVENOUS
  Administered 2020-11-21: 150 ug via INTRAVENOUS
  Administered 2020-11-21: 250 ug via INTRAVENOUS
  Administered 2020-11-21: 50 ug via INTRAVENOUS
  Administered 2020-11-21: 100 ug via INTRAVENOUS

## 2020-11-21 MED ORDER — METOPROLOL TARTRATE 12.5 MG HALF TABLET
12.5000 mg | ORAL_TABLET | Freq: Two times a day (BID) | ORAL | Status: DC
  Administered 2020-11-22 – 2020-11-23 (×4): 12.5 mg via ORAL
  Filled 2020-11-21 (×5): qty 1

## 2020-11-21 MED ORDER — CHLORHEXIDINE GLUCONATE 0.12 % MT SOLN
15.0000 mL | OROMUCOSAL | Status: AC
  Administered 2020-11-21: 15 mL via OROMUCOSAL
  Filled 2020-11-21: qty 15

## 2020-11-21 MED ORDER — PHENYLEPHRINE HCL-NACL 20-0.9 MG/250ML-% IV SOLN: 0.0000 ug/min | INTRAVENOUS | Status: DC

## 2020-11-21 MED ORDER — MIDAZOLAM HCL 2 MG/2ML IJ SOLN
INTRAMUSCULAR | Status: DC | PRN
  Administered 2020-11-21 (×4): 2 mg via INTRAVENOUS

## 2020-11-21 MED ORDER — PLATELET POOR PLASMA OPTIME
Status: DC | PRN
  Administered 2020-11-21: 10 mL

## 2020-11-21 MED ORDER — SODIUM CHLORIDE 0.9 % IV SOLN
INTRAVENOUS | Status: DC | PRN
  Administered 2020-11-21: 750 mg via INTRAVENOUS

## 2020-11-21 MED ORDER — SODIUM CHLORIDE 0.9 % IV SOLN
INTRAVENOUS | Status: DC
  Administered 2020-11-23: 10 mL/h via INTRAVENOUS

## 2020-11-21 MED ORDER — POTASSIUM CHLORIDE 10 MEQ/50ML IV SOLN: 10.0000 meq | INTRAVENOUS | Status: AC

## 2020-11-21 MED ORDER — SODIUM CHLORIDE 0.9% FLUSH: 10.0000 mL | INTRAVENOUS | Status: DC | PRN

## 2020-11-21 MED ORDER — MIDAZOLAM HCL 2 MG/2ML IJ SOLN: 2.0000 mg | INTRAMUSCULAR | Status: DC | PRN

## 2020-11-21 MED ORDER — LACTATED RINGERS IV SOLN: 500.0000 mL | Freq: Once | INTRAVENOUS | Status: DC | PRN

## 2020-11-21 MED ORDER — ORAL CARE MOUTH RINSE
15.0000 mL | OROMUCOSAL | Status: DC
  Administered 2020-11-21 (×2): 15 mL via OROMUCOSAL

## 2020-11-21 MED ORDER — ACETAMINOPHEN 160 MG/5ML PO SOLN: 1000.0000 mg | Freq: Four times a day (QID) | ORAL | Status: DC

## 2020-11-21 MED ORDER — 0.9 % SODIUM CHLORIDE (POUR BTL) OPTIME
TOPICAL | Status: DC | PRN
  Administered 2020-11-21: 1000 mL
  Administered 2020-11-21: 3000 mL

## 2020-11-21 MED ORDER — FENTANYL CITRATE (PF) 250 MCG/5ML IJ SOLN
INTRAMUSCULAR | Status: AC
  Filled 2020-11-21: qty 20

## 2020-11-21 MED ORDER — CHLORHEXIDINE GLUCONATE CLOTH 2 % EX PADS
6.0000 | MEDICATED_PAD | Freq: Every day | CUTANEOUS | Status: DC
  Administered 2020-11-21 – 2020-11-25 (×4): 6 via TOPICAL

## 2020-11-21 MED ORDER — BISACODYL 5 MG PO TBEC
10.0000 mg | DELAYED_RELEASE_TABLET | Freq: Every day | ORAL | Status: DC
  Administered 2020-11-22 – 2020-11-26 (×4): 10 mg via ORAL
  Filled 2020-11-21 (×4): qty 2

## 2020-11-21 MED ORDER — BUPIVACAINE HCL (PF) 0.5 % IJ SOLN
INTRAMUSCULAR | Status: AC
  Filled 2020-11-21: qty 30

## 2020-11-21 MED ORDER — ALBUMIN HUMAN 5 % IV SOLN
250.0000 mL | INTRAVENOUS | Status: AC | PRN
  Administered 2020-11-21 (×3): 12.5 g via INTRAVENOUS
  Filled 2020-11-21: qty 250

## 2020-11-21 MED ORDER — BISACODYL 10 MG RE SUPP: 10.0000 mg | Freq: Every day | RECTAL | Status: DC

## 2020-11-21 MED ORDER — VANCOMYCIN HCL 1000 MG IV SOLR
INTRAVENOUS | Status: AC
  Filled 2020-11-21: qty 3000

## 2020-11-21 MED ORDER — SODIUM CHLORIDE 0.9% FLUSH
3.0000 mL | Freq: Two times a day (BID) | INTRAVENOUS | Status: DC
  Administered 2020-11-22 – 2020-11-24 (×4): 3 mL via INTRAVENOUS

## 2020-11-21 MED ORDER — PLASMA-LYTE A IV SOLN: INTRAVENOUS | Status: DC

## 2020-11-21 MED ORDER — DEXMEDETOMIDINE HCL IN NACL 400 MCG/100ML IV SOLN
0.0000 ug/kg/h | INTRAVENOUS | Status: DC
  Filled 2020-11-21: qty 100

## 2020-11-21 MED ORDER — SODIUM CHLORIDE 0.9 % IV SOLN
1.5000 g | Freq: Two times a day (BID) | INTRAVENOUS | Status: AC
  Administered 2020-11-21 – 2020-11-23 (×4): 1.5 g via INTRAVENOUS
  Filled 2020-11-21 (×4): qty 1.5

## 2020-11-21 MED ORDER — EPHEDRINE SULFATE-NACL 50-0.9 MG/10ML-% IV SOSY
PREFILLED_SYRINGE | INTRAVENOUS | Status: DC | PRN
  Administered 2020-11-21: 10 mg via INTRAVENOUS

## 2020-11-21 MED ORDER — VANCOMYCIN HCL 1000 MG IV SOLR
INTRAVENOUS | Status: DC | PRN
  Administered 2020-11-21: 3000 mg

## 2020-11-21 MED ORDER — SODIUM CHLORIDE 0.9% FLUSH
3.0000 mL | INTRAVENOUS | Status: DC | PRN
Start: 2020-11-22 — End: 2020-11-22

## 2020-11-21 MED ORDER — ACETAMINOPHEN 650 MG RE SUPP
650.0000 mg | Freq: Once | RECTAL | Status: AC
  Administered 2020-11-21: 650 mg via RECTAL

## 2020-11-21 MED ORDER — MORPHINE SULFATE (PF) 2 MG/ML IV SOLN
1.0000 mg | INTRAVENOUS | Status: DC | PRN
  Administered 2020-11-21 (×3): 2 mg via INTRAVENOUS
  Administered 2020-11-22 (×2): 4 mg via INTRAVENOUS
  Administered 2020-11-22 (×2): 2 mg via INTRAVENOUS
  Administered 2020-11-22: 4 mg via INTRAVENOUS
  Filled 2020-11-21: qty 2
  Filled 2020-11-21 (×3): qty 1
  Filled 2020-11-21: qty 2
  Filled 2020-11-21: qty 1
  Filled 2020-11-21 (×2): qty 2

## 2020-11-21 MED ORDER — THROMBIN 5000 UNITS EX SOLR
INTRAVENOUS | Status: DC | PRN
  Administered 2020-11-21: 2 mL

## 2020-11-21 MED ORDER — TRAMADOL HCL 50 MG PO TABS
50.0000 mg | ORAL_TABLET | ORAL | Status: DC | PRN
  Administered 2020-11-21 – 2020-11-22 (×5): 100 mg via ORAL
  Administered 2020-11-23: 50 mg via ORAL
  Filled 2020-11-21 (×5): qty 2
  Filled 2020-11-21 (×2): qty 1

## 2020-11-21 MED ORDER — PROTAMINE SULFATE 10 MG/ML IV SOLN
INTRAVENOUS | Status: DC | PRN
  Administered 2020-11-21: 360 mg via INTRAVENOUS

## 2020-11-21 MED ORDER — PLASMA-LYTE 148 IV SOLN
INTRAVENOUS | Status: DC | PRN
  Administered 2020-11-21: 500 mL via INTRAVASCULAR

## 2020-11-21 MED ORDER — SODIUM CHLORIDE 0.9 % IV SOLN: 250.0000 mL | INTRAVENOUS | Status: DC

## 2020-11-21 MED ORDER — SODIUM CHLORIDE 0.9% IV SOLUTION: Freq: Once | INTRAVENOUS | Status: DC

## 2020-11-21 MED ORDER — SODIUM CHLORIDE 0.45 % IV SOLN: INTRAVENOUS | Status: DC | PRN

## 2020-11-21 MED ORDER — VANCOMYCIN HCL IN DEXTROSE 1-5 GM/200ML-% IV SOLN
1000.0000 mg | Freq: Once | INTRAVENOUS | Status: AC
  Administered 2020-11-22: 1000 mg via INTRAVENOUS
  Filled 2020-11-21 (×2): qty 200

## 2020-11-21 MED ORDER — LEVALBUTEROL HCL 1.25 MG/0.5ML IN NEBU: 1.2500 mg | INHALATION_SOLUTION | Freq: Four times a day (QID) | RESPIRATORY_TRACT | Status: DC

## 2020-11-21 MED ORDER — TRANEXAMIC ACID 1000 MG/10ML IV SOLN
1.5000 mg/kg/h | INTRAVENOUS | Status: DC
  Filled 2020-11-21: qty 25

## 2020-11-21 MED ORDER — BUPIVACAINE LIPOSOME 1.3 % IJ SUSP
INTRAMUSCULAR | Status: DC | PRN
  Administered 2020-11-21: 50 mL

## 2020-11-21 MED ORDER — HEPARIN SODIUM (PORCINE) 1000 UNIT/ML IJ SOLN
INTRAMUSCULAR | Status: DC | PRN
  Administered 2020-11-21: 36000 [IU] via INTRAVENOUS

## 2020-11-21 MED ORDER — METOPROLOL TARTRATE 5 MG/5ML IV SOLN
2.5000 mg | INTRAVENOUS | Status: DC | PRN
  Administered 2020-11-21 (×2): 2.5 mg via INTRAVENOUS
  Filled 2020-11-21: qty 5

## 2020-11-21 MED ORDER — NITROGLYCERIN IN D5W 200-5 MCG/ML-% IV SOLN: 0.0000 ug/min | INTRAVENOUS | Status: DC

## 2020-11-21 MED ORDER — STERILE WATER FOR INJECTION IJ SOLN
INTRAMUSCULAR | Status: DC | PRN
  Administered 2020-11-21: 10 mL

## 2020-11-21 MED ORDER — MIDAZOLAM HCL 2 MG/2ML IJ SOLN
INTRAMUSCULAR | Status: AC
  Filled 2020-11-21: qty 2

## 2020-11-21 MED ORDER — LIDOCAINE 2% (20 MG/ML) 5 ML SYRINGE
INTRAMUSCULAR | Status: DC | PRN
  Administered 2020-11-21: 60 mg via INTRAVENOUS

## 2020-11-21 MED ORDER — MIDAZOLAM HCL (PF) 10 MG/2ML IJ SOLN
INTRAMUSCULAR | Status: AC
  Filled 2020-11-21: qty 2

## 2020-11-21 MED ORDER — INSULIN REGULAR(HUMAN) IN NACL 100-0.9 UT/100ML-% IV SOLN
INTRAVENOUS | Status: DC
  Administered 2020-11-22: 1.8 [IU]/h via INTRAVENOUS
  Filled 2020-11-21: qty 100

## 2020-11-21 MED ORDER — PLATELET RICH PLASMA OPTIME
Status: DC | PRN
  Administered 2020-11-21: 10 mL

## 2020-11-21 MED ORDER — ASPIRIN 81 MG PO CHEW: 324.0000 mg | CHEWABLE_TABLET | Freq: Every day | ORAL | Status: DC

## 2020-11-21 MED ORDER — LEVALBUTEROL TARTRATE 45 MCG/ACT IN AERO: 2.0000 | INHALATION_SPRAY | Freq: Four times a day (QID) | RESPIRATORY_TRACT | Status: DC

## 2020-11-21 MED ORDER — PROPOFOL 10 MG/ML IV BOLUS
INTRAVENOUS | Status: DC | PRN
  Administered 2020-11-21: 30 mg via INTRAVENOUS

## 2020-11-21 MED ORDER — PROPOFOL 10 MG/ML IV BOLUS
INTRAVENOUS | Status: AC
  Filled 2020-11-21: qty 20

## 2020-11-21 MED ORDER — ASPIRIN EC 325 MG PO TBEC
325.0000 mg | DELAYED_RELEASE_TABLET | Freq: Every day | ORAL | Status: DC
  Administered 2020-11-22 – 2020-11-26 (×5): 325 mg via ORAL
  Filled 2020-11-21 (×5): qty 1

## 2020-11-21 MED ORDER — ACETAMINOPHEN 500 MG PO TABS
1000.0000 mg | ORAL_TABLET | Freq: Four times a day (QID) | ORAL | Status: DC
  Administered 2020-11-22 – 2020-11-25 (×14): 1000 mg via ORAL
  Filled 2020-11-21 (×13): qty 2

## 2020-11-21 MED FILL — Potassium Chloride Inj 2 mEq/ML: INTRAVENOUS | Qty: 40 | Status: AC

## 2020-11-21 MED FILL — Heparin Sodium (Porcine) Inj 1000 Unit/ML: INTRAMUSCULAR | Qty: 30 | Status: AC

## 2020-11-21 MED FILL — Magnesium Sulfate Inj 50%: INTRAMUSCULAR | Qty: 10 | Status: AC

## 2020-11-21 SURGICAL SUPPLY — 118 items
ADAPTER CARDIO PERF ANTE/RETRO (ADAPTER) ×4 IMPLANT
ADH SKN CLS APL DERMABOND .7 (GAUZE/BANDAGES/DRESSINGS) ×3
ADPR PRFSN 84XANTGRD RTRGD (ADAPTER) ×3
APL SRG 7X2 LUM MLBL SLNT (VASCULAR PRODUCTS)
APPLICATOR TIP COSEAL (VASCULAR PRODUCTS) IMPLANT
APPLIER CLIP 9.375 SM OPEN (CLIP) ×4
APR CLP SM 9.3 20 MLT OPN (CLIP) ×3
BAG DECANTER FOR FLEXI CONT (MISCELLANEOUS) ×4 IMPLANT
BLADE CLIPPER SURG (BLADE) ×4 IMPLANT
BLADE STERNUM SYSTEM 6 (BLADE) ×4 IMPLANT
BLADE SURG 15 STRL LF DISP TIS (BLADE) ×3 IMPLANT
BLADE SURG 15 STRL SS (BLADE) ×8
BNDG ELASTIC 4X5.8 VLCR STR LF (GAUZE/BANDAGES/DRESSINGS) ×4 IMPLANT
BNDG ELASTIC 6X5.8 VLCR STR LF (GAUZE/BANDAGES/DRESSINGS) ×3 IMPLANT
BNDG GAUZE ELAST 4 BULKY (GAUZE/BANDAGES/DRESSINGS) ×4 IMPLANT
CANISTER SUCT 3000ML PPV (MISCELLANEOUS) ×4 IMPLANT
CANNULA NON VENT 22FR 12 (CANNULA) ×1 IMPLANT
CATH CPB KIT HENDRICKSON (MISCELLANEOUS) ×4 IMPLANT
CATH ROBINSON RED A/P 18FR (CATHETERS) ×8 IMPLANT
CLIP APPLIE 9.375 SM OPEN (CLIP) ×3 IMPLANT
CLIP RETRACTION 3.0MM CORONARY (MISCELLANEOUS) ×3 IMPLANT
CLIP VESOCCLUDE MED 24/CT (CLIP) ×1 IMPLANT
CLIP VESOCCLUDE SM WIDE 24/CT (CLIP) ×12 IMPLANT
CONN 3/8X3/8 GISH STERILE (MISCELLANEOUS) ×1 IMPLANT
CONN ST 1/4X3/8  BEN (MISCELLANEOUS) ×12
CONN ST 1/4X3/8 BEN (MISCELLANEOUS) IMPLANT
CONN Y 3/8X3/8X3/8  BEN (MISCELLANEOUS) ×4
CONN Y 3/8X3/8X3/8 BEN (MISCELLANEOUS) IMPLANT
COVER MAYO STAND STRL (DRAPES) ×4 IMPLANT
CUFF TOURN SGL QUICK 18X4 (TOURNIQUET CUFF) IMPLANT
CUFF TOURN SGL QUICK 24 (TOURNIQUET CUFF)
CUFF TRNQT CYL 24X4X16.5-23 (TOURNIQUET CUFF) IMPLANT
DERMABOND ADVANCED (GAUZE/BANDAGES/DRESSINGS) ×1
DERMABOND ADVANCED .7 DNX12 (GAUZE/BANDAGES/DRESSINGS) ×3 IMPLANT
DRAIN CHANNEL 28F RND 3/8 FF (WOUND CARE) ×12 IMPLANT
DRAPE CARDIOVASCULAR INCISE (DRAPES) ×4
DRAPE EXTREMITY T 121X128X90 (DISPOSABLE) ×4 IMPLANT
DRAPE HALF SHEET 40X57 (DRAPES) ×5 IMPLANT
DRAPE SLUSH/WARMER DISC (DRAPES) ×3 IMPLANT
DRAPE SRG 135X102X78XABS (DRAPES) ×3 IMPLANT
DRAPE WARM FLUID 44X44 (DRAPES) ×1 IMPLANT
DRESSING PEEL AND PLAC PRVNA20 (GAUZE/BANDAGES/DRESSINGS) IMPLANT
DRSG AQUACEL AG ADV 3.5X14 (GAUZE/BANDAGES/DRESSINGS) ×4 IMPLANT
DRSG PEEL AND PLACE PREVENA 20 (GAUZE/BANDAGES/DRESSINGS) ×4
ELECT CAUTERY BLADE 6.4 (BLADE) ×4 IMPLANT
ELECT REM PT RETURN 9FT ADLT (ELECTROSURGICAL) ×8
ELECTRODE REM PT RTRN 9FT ADLT (ELECTROSURGICAL) ×6 IMPLANT
FELT TEFLON 1X6 (MISCELLANEOUS) ×7 IMPLANT
GAUZE SPONGE 4X4 12PLY STRL (GAUZE/BANDAGES/DRESSINGS) ×8 IMPLANT
GEL ULTRASOUND 20GR AQUASONIC (MISCELLANEOUS) ×4 IMPLANT
GLOVE NEODERM STRL 7.5  LF PF (GLOVE) ×9
GLOVE NEODERM STRL 7.5 LF PF (GLOVE) ×9 IMPLANT
GLOVE SURG NEODERM 7.5  LF PF (GLOVE) ×3
GOWN STRL REUS W/ TWL LRG LVL3 (GOWN DISPOSABLE) ×12 IMPLANT
GOWN STRL REUS W/TWL LRG LVL3 (GOWN DISPOSABLE) ×20
HEMOSTAT POWDER SURGIFOAM 1G (HEMOSTASIS) ×6 IMPLANT
INSERT FOGARTY XLG (MISCELLANEOUS) ×4 IMPLANT
INSERT SUTURE HOLDER (MISCELLANEOUS) ×4 IMPLANT
KIT APPLICATOR RATIO 11:1 (KITS) ×2 IMPLANT
KIT BASIN OR (CUSTOM PROCEDURE TRAY) ×4 IMPLANT
KIT SUCTION CATH 14FR (SUCTIONS) ×4 IMPLANT
KIT TURNOVER KIT B (KITS) ×4 IMPLANT
KIT VASOVIEW HEMOPRO 2 VH 4000 (KITS) ×3 IMPLANT
MARKER GRAFT CORONARY BYPASS (MISCELLANEOUS) ×9 IMPLANT
NDL 18GX1X1/2 (RX/OR ONLY) (NEEDLE) ×3 IMPLANT
NEEDLE 18GX1X1/2 (RX/OR ONLY) (NEEDLE) ×4 IMPLANT
NS IRRIG 1000ML POUR BTL (IV SOLUTION) ×18 IMPLANT
PACK E OPEN HEART (SUTURE) ×4 IMPLANT
PACK OPEN HEART (CUSTOM PROCEDURE TRAY) ×4 IMPLANT
PACK PLATELET PROCEDURE 60 (MISCELLANEOUS) ×1 IMPLANT
PACK SPY-PHI (KITS) ×1 IMPLANT
PAD ARMBOARD 7.5X6 YLW CONV (MISCELLANEOUS) ×8 IMPLANT
PAD ELECT DEFIB RADIOL ZOLL (MISCELLANEOUS) ×4 IMPLANT
PENCIL BUTTON HOLSTER BLD 10FT (ELECTRODE) ×4 IMPLANT
POSITIONER HEAD DONUT 9IN (MISCELLANEOUS) ×4 IMPLANT
POWDER SURGICEL 3.0 GRAM (HEMOSTASIS) ×4 IMPLANT
PUNCH AORTIC ROTATE 4.0MM (MISCELLANEOUS) ×1 IMPLANT
SEALANT SURG COSEAL 4ML (VASCULAR PRODUCTS) ×3 IMPLANT
SEALANT SURG COSEAL 8ML (VASCULAR PRODUCTS) ×1 IMPLANT
SET CARDIOPLEGIA MPS 5001102 (MISCELLANEOUS) ×1 IMPLANT
SHEARS HARMONIC 9CM CVD (BLADE) IMPLANT
SUT BONE WAX W31G (SUTURE) ×4 IMPLANT
SUT MNCRL AB 3-0 PS2 18 (SUTURE) ×8 IMPLANT
SUT MNCRL AB 4-0 PS2 18 (SUTURE) ×2 IMPLANT
SUT PDS AB 1 CTX 36 (SUTURE) ×8 IMPLANT
SUT PROLENE 3 0 SH DA (SUTURE) ×4 IMPLANT
SUT PROLENE 5 0 C 1 36 (SUTURE) IMPLANT
SUT PROLENE 6 0 C 1 30 (SUTURE) ×11 IMPLANT
SUT PROLENE 8 0 BV175 6 (SUTURE) ×2 IMPLANT
SUT PROLENE BLUE 7 0 (SUTURE) ×4 IMPLANT
SUT SILK  1 MH (SUTURE) ×4
SUT SILK 1 MH (SUTURE) IMPLANT
SUT SILK 2 0 SH CR/8 (SUTURE) IMPLANT
SUT SILK 3 0 SH CR/8 (SUTURE) IMPLANT
SUT STEEL 6MS V (SUTURE) ×4 IMPLANT
SUT STEEL SZ 6 DBL 3X14 BALL (SUTURE) ×4 IMPLANT
SUT VIC AB 2-0 CT1 27 (SUTURE) ×4
SUT VIC AB 2-0 CT1 TAPERPNT 27 (SUTURE) IMPLANT
SUT VIC AB 2-0 CTX 27 (SUTURE) IMPLANT
SUT VIC AB 3-0 SH 27 (SUTURE)
SUT VIC AB 3-0 SH 27X BRD (SUTURE) IMPLANT
SUT VIC AB 3-0 X1 27 (SUTURE) IMPLANT
SYR 10ML LL (SYRINGE) IMPLANT
SYR 20ML LL LF (SYRINGE) ×1 IMPLANT
SYR 30ML LL (SYRINGE) ×4 IMPLANT
SYR 3ML LL SCALE MARK (SYRINGE) ×4 IMPLANT
SYR 50ML SLIP (SYRINGE) IMPLANT
SYSTEM SAHARA CHEST DRAIN ATS (WOUND CARE) ×4 IMPLANT
TAPE CLOTH SURG 4X10 WHT LF (GAUZE/BANDAGES/DRESSINGS) ×1 IMPLANT
TAPE PAPER 2X10 WHT MICROPORE (GAUZE/BANDAGES/DRESSINGS) ×1 IMPLANT
TIP DUAL SPRAY TOPICAL (TIP) ×4 IMPLANT
TOWEL GREEN STERILE (TOWEL DISPOSABLE) ×4 IMPLANT
TOWEL GREEN STERILE FF (TOWEL DISPOSABLE) ×4 IMPLANT
TRAY FOLEY SLVR 16FR TEMP STAT (SET/KITS/TRAYS/PACK) ×4 IMPLANT
TUBING LAP HI FLOW INSUFFLATIO (TUBING) ×4 IMPLANT
UNDERPAD 30X36 HEAVY ABSORB (UNDERPADS AND DIAPERS) ×4 IMPLANT
WATER STERILE IRR 1000ML POUR (IV SOLUTION) ×8 IMPLANT
WATER STERILE IRR 1000ML UROMA (IV SOLUTION) IMPLANT

## 2020-11-21 NOTE — Procedures (Signed)
Extubation Procedure Note  Patient Details:   Name: KAY RICCIUTI DOB: 1946/04/24 [name]: [id number]   Airway Documentation:    Vent end date: 11/21/20 Vent end time: 1845   Evaluation  O2 sats: stable throughout Complications: No apparent complications Patient did tolerate procedure well. Bilateral Breath Sounds: Clear,Diminished   Yes   Pt extubated to 4L [place]. Positive cuff leak noted, no apparent stridor heard. Vitals stable throughout. RT will continue to monitor.   Memory Argue 11/21/2020, 6:51 PM

## 2020-11-21 NOTE — Transfer of Care (Signed)
Immediate Anesthesia Transfer of Care Note  Patient: Martin [name]  Procedure(s) Performed: CORONARY ARTERY BYPASS GRAFTING (CABG) TIMES FOUR ON PUMP USING BILATERAL INTERNAL MAMMARY ARTERIES AND LEFT RADIAL ARTERY (N/A Chest) RADIAL ARTERY HARVEST (Left Arm Lower) TRANSESOPHAGEAL ECHOCARDIOGRAM (TEE) (N/A ) INDOCYANINE GREEN FLUORESCENCE IMAGING (ICG) (N/A )  Patient Location: SICU  Anesthesia Type:General  Level of Consciousness: Patient remains intubated per anesthesia plan  Airway & Oxygen Therapy: Patient remains intubated per anesthesia plan and Patient placed on Ventilator (see vital sign flow sheet for setting)  Post-op Assessment: Report given to RN and Post -op Vital signs reviewed and stable  Post vital signs: Reviewed and stable  Last Vitals:  Vitals Value Taken Time  BP 134/72 11/21/20 1349  Temp 36.1 C 11/21/20 1400  Pulse 80 11/21/20 1400  Resp 12 11/21/20 1400  SpO2 100 % 11/21/20 1400  Vitals shown include unvalidated device data.  Last Pain:  Vitals:   11/21/20 0557  TempSrc: Oral  PainSc:       Patients Stated Pain Goal: 3 (11/20/20 0901)  Complications: No complications documented.

## 2020-11-21 NOTE — Anesthesia Procedure Notes (Signed)
Central Venous Catheter Insertion Performed by: Marcene Duos, MD, anesthesiologist Start/End3/23/2022 7:00 AM, 11/21/2020 7:15 AM Patient location: Pre-op. Preanesthetic checklist: patient identified, IV checked, site marked, risks and benefits discussed, surgical consent, monitors and equipment checked, pre-op evaluation, timeout performed and anesthesia consent Hand hygiene performed  and maximum sterile barriers used  PA cath was placed.Swan type:thermodilution PA Cath depth:50 Procedure performed without using ultrasound guided technique. Attempts: 1 Patient tolerated the procedure well with no immediate complications.

## 2020-11-21 NOTE — H&P (Signed)
History and Physical Interval Note:  11/21/2020 7:27 AM  Martin [name]  has presented today for surgery, with the diagnosis of CAD.  The various methods of treatment have been discussed with the patient and family. After consideration of risks, benefits and other options for treatment, the patient has consented to  Procedure(s) with comments: CORONARY ARTERY BYPASS GRAFTING (CABG) (N/A) - BIMA RADIAL ARTERY HARVEST (Left) TRANSESOPHAGEAL ECHOCARDIOGRAM (TEE) (N/A) INDOCYANINE GREEN FLUORESCENCE IMAGING (ICG) (N/A) as a surgical intervention.  The patient's history has been reviewed, patient examined, no change in status, stable for surgery.  I have reviewed the patient's chart and labs.  Questions were answered to the patient's satisfaction.     [name]

## 2020-11-21 NOTE — Progress Notes (Signed)
TCTS BRIEF SICU PROGRESS NOTE  Day of Surgery  S/P Procedure(s) (LRB): CORONARY ARTERY BYPASS GRAFTING (CABG) TIMES FOUR ON PUMP USING BILATERAL INTERNAL MAMMARY ARTERIES AND LEFT RADIAL ARTERY (N/A) RADIAL ARTERY HARVEST (Left) TRANSESOPHAGEAL ECHOCARDIOGRAM (TEE) (N/A) INDOCYANINE GREEN FLUORESCENCE IMAGING (ICG) (N/A)   Sedated on vent AV paced w/ stable hemodynamics O2 sats 100% on 50% FiO2 Chest tube output low Labs okay  Plan: Continue routine early postop  Purcell Nails, MD 11/21/2020 4:43 PM

## 2020-11-21 NOTE — Anesthesia Procedure Notes (Addendum)
Procedure Name: Intubation Date/Time: 11/21/2020 7:50 AM Performed by: Colon Flattery, CRNA Pre-anesthesia Checklist: Patient identified, Emergency Drugs available, Suction available and Patient being monitored Patient Re-evaluated:Patient Re-evaluated prior to induction Oxygen Delivery Method: Circle system utilized Preoxygenation: Pre-oxygenation with 100% oxygen Induction Type: IV induction Ventilation: Mask ventilation without difficulty and Oral airway inserted - appropriate to patient size Grade View: Grade I Tube type: Oral Tube size: 8.0 mm Number of attempts: 1 Airway Equipment and Method: Stylet and Oral airway Placement Confirmation: ETT inserted through vocal cords under direct vision,  positive ETCO2 and breath sounds checked- equal and bilateral Secured at: 22 cm Tube secured with: Tape Dental Injury: Teeth and Oropharynx as per pre-operative assessment

## 2020-11-21 NOTE — Discharge Instructions (Signed)

## 2020-11-21 NOTE — Anesthesia Procedure Notes (Signed)
Arterial Line Insertion Start/End3/23/2022 6:45 AM, 11/21/2020 6:55 AM Performed by: Adair Laundry, CRNA, CRNA  Patient location: Pre-op. Preanesthetic checklist: patient identified, IV checked, site marked, risks and benefits discussed, surgical consent, monitors and equipment checked, pre-op evaluation, timeout performed and anesthesia consent Lidocaine 1% used for infiltration and patient sedated Right, radial was placed Catheter size: 20 G Hand hygiene performed  and maximum sterile barriers used   Attempts: 2 Procedure performed without using ultrasound guided technique. Following insertion, dressing applied and Biopatch. Post procedure assessment: normal  Patient tolerated the procedure well with no immediate complications.

## 2020-11-21 NOTE — Hospital Course (Addendum)
History of Present Illness:  Mr. Martin [name] is a 75 yo male with known history of GERD, UC, BPH, and known CAD. This dates back to 2008 when he was found to have a significant stenosis in his RCA.  He was treated with DES at that time.  However, the stent acutely thrombosed the same day.  He subsequently had 3 additional stents placed at that time.  He has been routinely followed by Dr. Antoine Poche since that time.  He presented for his 1 year follow up on 11/02/2020.  At that visit the patient admitted to experiencing fatigue.  He felt this was possibly related to his hormone therapy he was taking previously for previous prostate cancer. The patient admitted to getting tired even doing normal every day activities.  He can only work for about 10 minutes before he has to stop and rest.  He occasional experiences some minor chest discomfort/tightness with shortness of breath if the work is very strenuous.  He underwent stress testing in 2015 and 2020 both of which were negative for ischemia.  It was felt the patient was likely suffering angina due to obstructive CAD.  He was set up for stress testing which was performed on 11/08/2020.  Ultimately this was abnormal with multiple areas of ischemia and partially reversible defects.  His EF was mildly reduced.  He presented to [facility] today for elective outpatient cardiac catheterization.  This showed multivessel CAD.  He is being admitted and TCTS consult has been requested for possible coronary bypass grafting procedure.   Hospital Course:   The patient was evaluated by Dr. Vickey Sages.  He denied chest pain.  He admits to really experiencing fatigue over the past 3-4 months.  He states he was previously a very active guy and with his job would walk several miles per day.  He is a non-smoker.  He denies history of diabetes.  He has had his rotator cuff repaired and post operatively he suffered a PE.  The only other surgical procedure he has had was placement of his  radioactive seeds for his prostate cancer.  It was felt the patient would be a good candidate for coronary bypass grafting procedure.  The risks and benefits of the procedure were explained to the patient and he was agreeable to proceed.   He was taken to the operating room and underwent CABG x 4  This was done utilizing LIMA to LAD, RIMA to PDA, and Sequential Radial Artery to OM and Ramus Intermediate. He also underwent open harvest of his left radial artery.  He tolerated the procedure without difficulty and was taken to the SICU in stable condition.  The patient was extubated the evening of surgery.  He was weaned off NTG drip and transitioned to Isordil for his radial artery graft.  His chest tubes and arterial lines were removed without difficulty.  He was started on diuretics for volume overload state.  He remains in NSR and was felt stable for transfer to the telemetry unit on 11/24/2020. He continued to make good progress.  His pacing wires were removed without difficulty.  His beta blocker dose was titrated for additional HR and blood pressure control.  He is ambulating without difficulty.  His surgical incisions are healing without evidence of infection.  He denies parasthesias in his left radial artery site.  He is medically stable for discharge home today.

## 2020-11-21 NOTE — Progress Notes (Signed)
NIF -20 VC 800 ml Pt performed with good effort.

## 2020-11-21 NOTE — Op Note (Signed)
CARDIOTHORACIC SURGERY OPERATIVE NOTE  Date of Procedure: 11/21/2020  Preoperative Diagnosis: Severe 3-vessel Coronary Artery Disease  Postoperative Diagnosis: Same  Procedure:    Coronary Artery Bypass Grafting x 4  Left Internal Mammary Artery to Distal Left Anterior Descending Coronary Artery; pedicled right internal mammary artery graft to posterior Descending Coronary Artery; left radial artery graft to obtuse Marginal Branch of Left Circumflex Coronary Artery and ramus intermedius coronary Artery as a sequenced graft Open left radial artery Harvest  Bilateral internal mammary artery harvesting Completion graft surveillance with indocyanine green fluorescence imaging  Surgeon: B.  Lorayne Marek, MD  Assistant: Jaclyn Prime, PA-C  Anesthesia: General  Operative Findings:  Preserved left ventricular systolic function  Good quality internal mammary artery conduits  Good quality radial artery conduit  Fair quality target vessels for grafting    BRIEF CLINICAL NOTE AND INDICATIONS FOR SURGERY  75 year old man with known coronary artery disease presented with several months of worsening fatigue.  He also had extreme shortness of breath with exertion.  Given his prior history of coronary artery disease he underwent left heart catheterization on 11/20/2020 which demonstrated progression of his native coronary disease.  This was diffuse and severe and he was referred for CABG.  Has been thoroughly evaluated as a candidate for CABG is considered a good candidate.  He is taken the operating room on the above listed date.   DETAILS OF THE OPERATIVE PROCEDURE  Preparation:  The patient is brought to the operating room on the above mentioned date and central monitoring was established by the anesthesia team including placement of Swan-Ganz catheter and radial arterial line. The patient is placed in the supine position on the operating table.  Intravenous antibiotics are administered.  General endotracheal anesthesia is induced uneventfully. A Foley catheter is placed.  Baseline transesophageal echocardiogram was performed.  Findings were notable for preserved LV function and no significant valvular disease  The patient's chest, abdomen, both groins, the left upper extremity, and both lower extremities are prepared and draped in a sterile manner. A time out procedure is performed.   Surgical Approach and Conduit Harvest:  A median sternotomy incision was performed and the left internal mammary artery is dissected from the chest wall and prepared for bypass grafting. The left internal mammary artery is notably good quality conduit. Simultaneously, open left radial artery harvesting is performed.  Once the forearm incision is closed, the left arm is tucked at the side.  Attention is turned to the right hemithorax of the right internal mammary artery is pedicle mobilized in standard fashion.  Prior to dividing the pedicle distally, full dose heparin is given intravenously.  Following systemic heparinization, the  internal mammary artery conduits were transected distally noted to have excellent flow.  Both pedicles were treated with a solution of papaverine   Extracorporeal Cardiopulmonary Bypass and Myocardial Protection:  The pericardium is opened. The ascending aorta is nondiseased in appearance. The ascending aorta and the right atrium are cannulated for cardiopulmonary bypass.  Adequate heparinization is verified.    The entire pre-bypass portion of the operation was notable for stable hemodynamics.  Cardiopulmonary bypass was begun and the surface of the heart is inspected. Distal target vessels are selected for coronary artery bypass grafting. A cardioplegia cannula is placed in the ascending aorta.  The patient is allowed to cool passively to 34C systemic temperature.  The aortic cross clamp is applied and cold blood cardioplegia is delivered initially in an antegrade  fashion through the aortic  root.   Iced saline slush is applied for topical hypothermia.  The initial cardioplegic arrest is rapid with early diastolic arrest.  Repeat doses of cardioplegia are administered intermittently  in order to maintain completely flat electrocardiogram.   Coronary Artery Bypass Grafting:   The  posterior descending branch of the right coronary artery was grafted using the pedicled right internal mammary artery graft in an end-to-side fashion.  At the site of distal anastomosis the target vessel was good quality and measured approximately 1.5 mm in diameter. Anastomotic patency and runoff was confirmed with indocyanine green fluorescence imaging (SPY).  The ramus intermedius coronary artery and the obtuse marginal branch of the left circumflex coronary artery were grafted using the left radial artery graft in a sequenced fashion.  At the site of distal anastomoses the target vessel were fair quality and measured approximately 1.2 mm in diameter.  The distal left anterior coronary artery was grafted with the left internal mammary artery in an end-to-side fashion.  At the site of distal anastomosis the target vessel was fair quality and measured approximately 1.2 mm in diameter. Anastomotic patency and runoff was confirmed with indocyanine green fluorescence imaging (SPY).  The proximal radial artery graft anastomosis was placed directly to the ascending aorta prior to removal of the aortic cross clamp.  A reanimation dose of cardioplegia was delivered followed by de-airing procedures and the aortic cross-clamp was removed.   Procedure Completion:  All proximal and distal coronary anastomoses were inspected for hemostasis and appropriate graft orientation. Epicardial pacing wires are fixed to the right ventricular outflow tract and to the right atrial appendage. The patient is rewarmed to 37C temperature. The patient is weaned and disconnected from cardiopulmonary bypass.  The  patient's rhythm at separation from bypass was sinus bradycardia.  The patient was weaned from cardiopulmonary bypass  without any inotropic support.   Followup transesophageal echocardiogram performed after separation from bypass revealed  no changes from the preoperative exam.  The aortic and venous cannula were removed uneventfully. Protamine was administered to reverse the anticoagulation. The mediastinum and pleural space were inspected for hemostasis and irrigated with saline solution. The mediastinum and both pleural spaces were drained using fluted chest tubes placed through separate stab incisions inferiorly.  The soft tissues anterior to the aorta were reapproximated loosely. The sternum is closed with double strength sternal wire. The soft tissues anterior to the sternum were closed in multiple layers and the skin is closed with a running subcuticular skin closure.  The post-bypass portion of the operation was notable for stable rhythm and hemodynamics.    Disposition:  The patient tolerated the procedure well and is transported to the surgical intensive care in stable condition. There are no intraoperative complications. All sponge instrument and needle counts are verified correct at completion of the operation.    Brantley Fling, MD 11/21/2020 3:00 PM

## 2020-11-21 NOTE — Brief Op Note (Signed)
11/20/2020 - 11/21/2020  12:06 PM  PATIENT:  Martin [name]  75 y.o. male  PRE-OPERATIVE DIAGNOSIS:  Coronary Artery Disease  POST-OPERATIVE DIAGNOSIS:  Coronary Artery Disease  PROCEDURE:  Procedure(s) with comments:  CORONARY ARTERY BYPASS GRAFTING x 4 -LIMA to LAD -SEQUENTIAL RADIAL ARTERY OM and RAMUS INTERMEDIATE -RIMA to PDA  OPEN RADIAL ARTERY HARVEST (Left)  TRANSESOPHAGEAL ECHOCARDIOGRAM (TEE) (N/A)  INDOCYANINE GREEN FLUORESCENCE IMAGING (ICG) (N/A)  SURGEON:  Surgeon(s) and Role:    * [name], MD - Primary  PHYSICIAN ASSISTANT: Lowella Dandy PA-C  ANESTHESIA:   general  EBL:  Per anes record   BLOOD ADMINISTERED: CELLSAVER  DRAINS: Left and Right Pleural Chest Tubes, Mediastinal Chest Drains   LOCAL MEDICATIONS USED: Exparel  SPECIMEN:  No Specimen  DISPOSITION OF SPECIMEN:  N/A  COUNTS:  YES  TOURNIQUET:  * No tourniquets in log *  DICTATION: .Dragon Dictation  PLAN OF CARE: Admit to inpatient   PATIENT DISPOSITION:  ICU - intubated and hemodynamically stable.   Delay start of Pharmacological VTE agent (>24hrs) due to surgical blood loss or risk of bleeding: yes

## 2020-11-21 NOTE — Anesthesia Procedure Notes (Signed)
Central Venous Catheter Insertion Performed by: Suzette Battiest, MD, anesthesiologist Start/End3/23/2022 7:00 AM, 11/21/2020 7:15 AM Patient location: Pre-op. Preanesthetic checklist: patient identified, IV checked, site marked, risks and benefits discussed, surgical consent, monitors and equipment checked, pre-op evaluation, timeout performed and anesthesia consent Position: Trendelenburg Lidocaine 1% used for infiltration and patient sedated Hand hygiene performed , maximum sterile barriers used  and Seldinger technique used Catheter size: 9 Fr Total catheter length 10. Central line was placed.MAC introducer Swan type:thermodilution PA Cath depth:50 Procedure performed using ultrasound guided technique. Ultrasound Notes:anatomy identified, needle tip was noted to be adjacent to the nerve/plexus identified, no ultrasound evidence of intravascular and/or intraneural injection and image(s) printed for medical record Attempts: 1 Following insertion, line sutured, dressing applied and Biopatch. Post procedure assessment: blood return through all ports, free fluid flow and no air  Patient tolerated the procedure well with no immediate complications.

## 2020-11-22 ENCOUNTER — Inpatient Hospital Stay (HOSPITAL_COMMUNITY): Payer: Medicare PPO

## 2020-11-22 ENCOUNTER — Encounter (HOSPITAL_COMMUNITY): Payer: Self-pay | Admitting: Cardiothoracic Surgery

## 2020-11-22 DIAGNOSIS — I25118 Atherosclerotic heart disease of native coronary artery with other forms of angina pectoris: Secondary | ICD-10-CM

## 2020-11-22 LAB — CBC
HCT: 30.4 % — ABNORMAL LOW (ref 39.0–52.0)
HCT: 32.4 % — ABNORMAL LOW (ref 39.0–52.0)
Hemoglobin: 9.7 g/dL — ABNORMAL LOW (ref 13.0–17.0)
Hemoglobin: 10.1 g/dL — ABNORMAL LOW (ref 13.0–17.0)
MCH: 29.8 pg (ref 26.0–34.0)
MCH: 29.9 pg (ref 26.0–34.0)
MCHC: 31.9 g/dL (ref 30.0–36.0)
MCHC: 31.2 g/dL (ref 30.0–36.0)
MCV: 93.5 fL (ref 80.0–100.0)
MCV: 95.9 fL (ref 80.0–100.0)
Platelets: 186 10*3/uL (ref 150–400)
Platelets: 186 10*3/uL (ref 150–400)
RBC: 3.25 MIL/uL — ABNORMAL LOW (ref 4.22–5.81)
RBC: 3.38 MIL/uL — ABNORMAL LOW (ref 4.22–5.81)
RDW: 14.5 % (ref 11.5–15.5)
RDW: 14.8 % (ref 11.5–15.5)
WBC: 11.4 10*3/uL — ABNORMAL HIGH (ref 4.0–10.5)
WBC: 12.5 10*3/uL — ABNORMAL HIGH (ref 4.0–10.5)
nRBC: 0 % (ref 0.0–0.2)
nRBC: 0 % (ref 0.0–0.2)

## 2020-11-22 LAB — GLUCOSE, CAPILLARY
Glucose-Capillary: 130 mg/dL — ABNORMAL HIGH (ref 70–99)
Glucose-Capillary: 129 mg/dL — ABNORMAL HIGH (ref 70–99)
Glucose-Capillary: 110 mg/dL — ABNORMAL HIGH (ref 70–99)
Glucose-Capillary: 125 mg/dL — ABNORMAL HIGH (ref 70–99)
Glucose-Capillary: 125 mg/dL — ABNORMAL HIGH (ref 70–99)
Glucose-Capillary: 125 mg/dL — ABNORMAL HIGH (ref 70–99)
Glucose-Capillary: 118 mg/dL — ABNORMAL HIGH (ref 70–99)
Glucose-Capillary: 115 mg/dL — ABNORMAL HIGH (ref 70–99)
Glucose-Capillary: 115 mg/dL — ABNORMAL HIGH (ref 70–99)
Glucose-Capillary: 121 mg/dL — ABNORMAL HIGH (ref 70–99)
Glucose-Capillary: 121 mg/dL — ABNORMAL HIGH (ref 70–99)
Glucose-Capillary: 105 mg/dL — ABNORMAL HIGH (ref 70–99)
Glucose-Capillary: 114 mg/dL — ABNORMAL HIGH (ref 70–99)
Glucose-Capillary: 112 mg/dL — ABNORMAL HIGH (ref 70–99)

## 2020-11-22 LAB — BASIC METABOLIC PANEL
Anion gap: 6 (ref 5–15)
Anion gap: 6 (ref 5–15)
BUN: 19 mg/dL (ref 8–23)
BUN: 22 mg/dL (ref 8–23)
CO2: 22 mmol/L (ref 22–32)
CO2: 25 mmol/L (ref 22–32)
Calcium: 8.4 mg/dL — ABNORMAL LOW (ref 8.9–10.3)
Calcium: 8.6 mg/dL — ABNORMAL LOW (ref 8.9–10.3)
Chloride: 106 mmol/L (ref 98–111)
Chloride: 104 mmol/L (ref 98–111)
Creatinine, Ser: 1.01 mg/dL (ref 0.61–1.24)
Creatinine, Ser: 1.11 mg/dL (ref 0.61–1.24)
GFR, Estimated: >60 mL/min (ref >60)
GFR, Estimated: >60 mL/min (ref >60)
Glucose, Bld: 135 mg/dL — ABNORMAL HIGH (ref 70–99)
Glucose, Bld: 119 mg/dL — ABNORMAL HIGH (ref 70–99)
Potassium: 4.3 mmol/L (ref 3.5–5.1)
Potassium: 4.5 mmol/L (ref 3.5–5.1)
Sodium: 134 mmol/L — ABNORMAL LOW (ref 135–145)
Sodium: 135 mmol/L (ref 135–145)

## 2020-11-22 LAB — MAGNESIUM
Magnesium: 2.3 mg/dL (ref 1.7–2.4)
Magnesium: 2.5 mg/dL — ABNORMAL HIGH (ref 1.7–2.4)

## 2020-11-22 MED ORDER — LEVALBUTEROL HCL 1.25 MG/0.5ML IN NEBU: 1.2500 mg | INHALATION_SOLUTION | Freq: Four times a day (QID) | RESPIRATORY_TRACT | Status: DC | PRN

## 2020-11-22 MED ORDER — COLCHICINE 0.3 MG HALF TABLET
0.3000 mg | ORAL_TABLET | Freq: Two times a day (BID) | ORAL | Status: DC
  Administered 2020-11-22 – 2020-11-26 (×9): 0.3 mg via ORAL
  Filled 2020-11-22 (×10): qty 1

## 2020-11-22 MED ORDER — ISOSORBIDE DINITRATE 10 MG PO TABS
10.0000 mg | ORAL_TABLET | Freq: Three times a day (TID) | ORAL | Status: DC
  Administered 2020-11-22 – 2020-11-26 (×13): 10 mg via ORAL
  Filled 2020-11-22 (×14): qty 1

## 2020-11-22 MED ORDER — THIAMINE HCL 100 MG/ML IJ SOLN
Freq: Once | INTRAVENOUS | Status: AC
  Filled 2020-11-22: qty 1000

## 2020-11-22 NOTE — Anesthesia Postprocedure Evaluation (Signed)
Anesthesia Post Note  Patient: Martin [name]  Procedure(s) Performed: CORONARY ARTERY BYPASS GRAFTING (CABG) TIMES FOUR ON PUMP USING BILATERAL INTERNAL MAMMARY ARTERIES AND LEFT RADIAL ARTERY (N/A Chest) RADIAL ARTERY HARVEST (Left Arm Lower) TRANSESOPHAGEAL ECHOCARDIOGRAM (TEE) (N/A ) INDOCYANINE GREEN FLUORESCENCE IMAGING (ICG) (N/A )     Patient location during evaluation: SICU Anesthesia Type: General Level of consciousness: sedated Pain management: pain level controlled Vital Signs Assessment: post-procedure vital signs reviewed and stable Respiratory status: patient remains intubated per anesthesia plan Cardiovascular status: stable Postop Assessment: no apparent nausea or vomiting Anesthetic complications: no   No complications documented.  Last Vitals:  Vitals:   11/22/20 1105 11/22/20 1115  BP:    Pulse: 90   Resp:    Temp:  36.7 C  SpO2:      Last Pain:  Vitals:   11/22/20 1115  TempSrc: Oral  PainSc:                  Kennieth Rad

## 2020-11-22 NOTE — Progress Notes (Signed)
   Awake, oriented, and having no complaints.  Asking if he should take magnesium supplements as an outpatient (I discouraged).  Nurse feels he has been in atrial flutter.  I reviewed the rhythm strips and the monitor and find no evidence of atrial flutter.  Continue with progression.

## 2020-11-22 NOTE — Progress Notes (Signed)
1 Day Post-Op Procedure(s) (LRB): CORONARY ARTERY BYPASS GRAFTING (CABG) TIMES FOUR ON PUMP USING BILATERAL INTERNAL MAMMARY ARTERIES AND LEFT RADIAL ARTERY (N/A) RADIAL ARTERY HARVEST (Left) TRANSESOPHAGEAL ECHOCARDIOGRAM (TEE) (N/A) INDOCYANINE GREEN FLUORESCENCE IMAGING (ICG) (N/A) Subjective: No complaints Objective: Vital signs in last 24 hours: Temp:  [96.08 F (35.6 C)-98.6 F (37 C)] 97.88 F (36.6 C) (03/24 0830) Pulse Rate:  [68-94] 69 (03/24 0830) Cardiac Rhythm: A-V Sequential paced (03/23 2000) Resp:  [0-28] 11 (03/24 0830) BP: (94-139)/(51-94) 106/59 (03/24 0800) SpO2:  [92 %-100 %] 96 % (03/24 0830) Arterial Line BP: (95-183)/(44-89) 117/46 (03/24 0830) FiO2 (%):  [40 %-50 %] 40 % (03/23 1815) Weight:  [108.3 kg] 108.3 kg (03/24 0545)  Hemodynamic parameters for last 24 hours: PAP: (31-58)/(15-33) 40/16 CO:  [2.8 L/min-6.1 L/min] 4.8 L/min CI:  [1.3 L/min/m2-2.7 L/min/m2] 2.1 L/min/m2  Intake/Output from previous day: 03/23 0701 - 03/24 0700 In: 4569.2 [I.V.:3657.7; Blood:265; IV Piggyback:646.5] Out: 2435 [Urine:1466; Blood:509; Chest Tube:460] Intake/Output this shift: Total I/O In: 92.4 [I.V.:92.4] Out: -   General appearance: alert and cooperative Neurologic: intact Heart: regular rate and rhythm, S1, S2 normal, no murmur, click, rub or gallop Lungs: clear to auscultation bilaterally Abdomen: soft, non-tender; bowel sounds normal; no masses,  no organomegaly Extremities: extremities normal, atraumatic, no cyanosis or edema Wound: dressed,dry  Lab Results: Recent Labs    11/21/20 2044 11/21/20 2046 11/22/20 0424  WBC 10.7*  --  11.4*  HGB 10.2* 10.2* 9.7*  HCT 32.1* 30.0* 30.4*  PLT 196  --  186   BMET:  Recent Labs    11/21/20 2044 11/21/20 2046 11/22/20 0424  NA 138 140 134*  K 4.3 4.3 4.3  CL 110  --  106  CO2 21*  --  22  GLUCOSE 122*  --  135*  BUN 20  --  19  CREATININE 0.98  --  1.01  CALCIUM 8.3*  --  8.4*    PT/INR:   Recent Labs    11/21/20 1344  LABPROT 15.7*  INR 1.3*   ABG    Component Value Date/Time   PHART 7.301 (L) 11/21/2020 2046   HCO3 21.3 11/21/2020 2046   TCO2 23 11/21/2020 2046   ACIDBASEDEF 5.0 (H) 11/21/2020 2046   O2SAT 95.0 11/21/2020 2046   CBG (last 3)  Recent Labs    11/22/20 0429 11/22/20 0615 11/22/20 0834  GLUCAP 129* 110* 125*  125*  125*    Assessment/Plan: S/P Procedure(s) (LRB): CORONARY ARTERY BYPASS GRAFTING (CABG) TIMES FOUR ON PUMP USING BILATERAL INTERNAL MAMMARY ARTERIES AND LEFT RADIAL ARTERY (N/A) RADIAL ARTERY HARVEST (Left) TRANSESOPHAGEAL ECHOCARDIOGRAM (TEE) (N/A) INDOCYANINE GREEN FLUORESCENCE IMAGING (ICG) (N/A) Mobilize Diabetes control gentle resusucitation   LOS: 2 days    [name] 11/22/2020

## 2020-11-22 NOTE — Plan of Care (Signed)

## 2020-11-23 ENCOUNTER — Telehealth: Payer: Self-pay | Admitting: Cardiology

## 2020-11-23 ENCOUNTER — Inpatient Hospital Stay (HOSPITAL_COMMUNITY): Payer: Medicare PPO

## 2020-11-23 DIAGNOSIS — Z951 Presence of aortocoronary bypass graft: Secondary | ICD-10-CM

## 2020-11-23 LAB — BASIC METABOLIC PANEL
Anion gap: 3 — ABNORMAL LOW (ref 5–15)
BUN: 28 mg/dL — ABNORMAL HIGH (ref 8–23)
CO2: 26 mmol/L (ref 22–32)
Calcium: 8.4 mg/dL — ABNORMAL LOW (ref 8.9–10.3)
Chloride: 105 mmol/L (ref 98–111)
Creatinine, Ser: 1.16 mg/dL (ref 0.61–1.24)
GFR, Estimated: >60 mL/min (ref >60)
Glucose, Bld: 104 mg/dL — ABNORMAL HIGH (ref 70–99)
Potassium: 4.6 mmol/L (ref 3.5–5.1)
Sodium: 134 mmol/L — ABNORMAL LOW (ref 135–145)

## 2020-11-23 LAB — CBC
HCT: 28.1 % — ABNORMAL LOW (ref 39.0–52.0)
Hemoglobin: 8.6 g/dL — ABNORMAL LOW (ref 13.0–17.0)
MCH: 29.6 pg (ref 26.0–34.0)
MCHC: 30.6 g/dL (ref 30.0–36.0)
MCV: 96.6 fL (ref 80.0–100.0)
Platelets: 173 10*3/uL (ref 150–400)
RBC: 2.91 MIL/uL — ABNORMAL LOW (ref 4.22–5.81)
RDW: 14.9 % (ref 11.5–15.5)
WBC: 9.9 10*3/uL (ref 4.0–10.5)
nRBC: 0 % (ref 0.0–0.2)

## 2020-11-23 LAB — GLUCOSE, CAPILLARY
Glucose-Capillary: 100 mg/dL — ABNORMAL HIGH (ref 70–99)
Glucose-Capillary: 106 mg/dL — ABNORMAL HIGH (ref 70–99)
Glucose-Capillary: 108 mg/dL — ABNORMAL HIGH (ref 70–99)
Glucose-Capillary: 109 mg/dL — ABNORMAL HIGH (ref 70–99)
Glucose-Capillary: 117 mg/dL — ABNORMAL HIGH (ref 70–99)
Glucose-Capillary: 119 mg/dL — ABNORMAL HIGH (ref 70–99)
Glucose-Capillary: 148 mg/dL — ABNORMAL HIGH (ref 70–99)
Glucose-Capillary: 158 mg/dL — ABNORMAL HIGH (ref 70–99)

## 2020-11-23 MED ORDER — FUROSEMIDE 10 MG/ML IJ SOLN
40.0000 mg | Freq: Two times a day (BID) | INTRAMUSCULAR | Status: DC
  Administered 2020-11-23 – 2020-11-24 (×3): 40 mg via INTRAVENOUS
  Filled 2020-11-23 (×3): qty 4

## 2020-11-23 NOTE — Discharge Summary (Signed)
[address]       [place] [address]             [phone]    Physician Discharge Summary  Patient ID: Martin [name] [name]: [id number] DOB/AGE: [birth year] 75 y.o.  Admit date: 11/20/2020 Discharge date: 11/26/2020  Admission Diagnoses:  Patient Active Problem List   Diagnosis Date Noted  . CAD (coronary artery disease), native coronary artery 11/20/2020  . Abnormal nuclear stress test   . Essential hypertension 11/01/2020  . 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 Educated about COVID-19 virus infection 12/11/2019  . Dyslipidemia 07/07/2019  . Stenosis of left carotid artery 07/07/2019  . DYSLIPIDEMIA 08/01/2009  . Coronary artery disease involving native coronary artery of native heart with angina pectoris (HCC) 08/01/2009  . GERD 08/01/2009  . COLITIS, ULCERATIVE 08/01/2009  . PALPITATIONS 07/13/2009   Discharge Diagnoses:  Patient Active Problem List   Diagnosis Date Noted  . 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  . CAD (coronary artery disease), native coronary artery 11/20/2020  . Abnormal nuclear stress test   . Essential hypertension 11/01/2020  . 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 Educated about COVID-19 virus infection 12/11/2019  . Dyslipidemia 07/07/2019  . Stenosis of left carotid artery 07/07/2019  . DYSLIPIDEMIA 08/01/2009  . Coronary artery disease involving native coronary artery of native heart with angina pectoris (HCC) 08/01/2009  . GERD 08/01/2009  . COLITIS, ULCERATIVE 08/01/2009  . PALPITATIONS 07/13/2009   Discharged Condition: good  History of Present Illness:  Martin [name] is a 75 yo male with known history of GERD, UC, BPH, and known CAD. This dates back to 2008 when he was found to have a significant stenosis in his RCA.  He was treated with DES at that time.  However, the stent acutely thrombosed the same day.  He subsequently had 3 additional stents placed at that time.  He has been routinely followed by Dr.  Antoine Poche since that time.  He presented for his 1 year follow up on 11/02/2020.  At that visit the patient admitted to experiencing fatigue.  He felt this was possibly related to his hormone therapy he was taking previously for previous prostate cancer. The patient admitted to getting tired even doing normal every day activities.  He can only work for about 10 minutes before he has to stop and rest.  He occasional experiences some minor chest discomfort/tightness with shortness of breath if the work is very strenuous.  He underwent stress testing in 2015 and 2020 both of which were negative for ischemia.  It was felt the patient was likely suffering angina due to obstructive CAD.  He was set up for stress testing which was performed on 11/08/2020.  Ultimately this was abnormal with multiple areas of ischemia and partially reversible defects.  His EF was mildly reduced.  He presented to [facility] today for elective outpatient cardiac catheterization.  This showed multivessel CAD.  He is being admitted and TCTS consult has been requested for possible coronary bypass grafting procedure.   Hospital Course:   The patient was evaluated by Dr. Vickey Sages.  He denied chest pain.  He admits to really experiencing fatigue over the past 3-4 months.  He states he was previously a very active guy and with his job would walk several miles per day.  He is a non-smoker.  He denies history of diabetes.  He has had his rotator cuff repaired and post operatively he suffered a PE.  The only other surgical procedure he has had was placement of his radioactive seeds for his prostate cancer.  It was felt the patient would be a good candidate for coronary bypass grafting procedure.  The risks and benefits of the procedure were explained to the patient and he was agreeable to proceed.   He was taken to the operating room and underwent CABG x 4  This was done utilizing LIMA to LAD, RIMA to PDA, and Sequential Radial Artery to OM and  Ramus Intermediate. He also underwent open harvest of his left radial artery.  He tolerated the procedure without difficulty and was taken to the SICU in stable condition.  The patient was extubated the evening of surgery.  He was weaned off NTG drip and transitioned to Isordil for his radial artery graft.  His chest tubes and arterial lines were removed without difficulty.  He was started on diuretics for volume overload state.  He remains in NSR and was felt stable for transfer to the telemetry unit on 11/24/2020. He continued to make good progress.  His pacing wires were removed without difficulty.  His beta blocker dose was titrated for additional HR and blood pressure control.  He is ambulating without difficulty.  His surgical incisions are healing without evidence of infection.  He denies parasthesias in his left radial artery site.  He is medically stable for discharge home today.      Significant Diagnostic Studies: angiography:    Prox RCA lesion is 80% stenosed.  Mid RCA-1 lesion is 70% stenosed.  Mid RCA-2 lesion is 90% stenosed.  RPDA lesion is 60% stenosed.  1st Diag lesion is 99% stenosed.  2nd Diag lesion is 90% stenosed.  Ost LAD to Prox LAD lesion is 70% stenosed.  Prox LAD to Mid LAD lesion is 70% stenosed.  Ost Cx to Prox Cx lesion is 50% stenosed.  Prox Cx to Mid Cx lesion is 50% stenosed.  Mid LAD lesion is 95% stenosed.  Mid LAD to Dist LAD lesion is 70% stenosed.   Severe progressive multivessel CAD with 70% proximal, 70%, 95% and 70% mid LAD stenosis with a 99% stenosis in a very proximal diagonal vessel and 90% ostial stenosis in the second diagonal vessel; ostial and proximal 40 to 50% circumflex stenoses; and in-stent restenosis of 80% followed by 70% in the tandem sequential stents the RCA with new 90% stenosis beyond the stented segment and evidence for 50 to 60% ostial stenosis of a large PDA vessel.  Preserved global LV contractility with a subtle  area of mild mid anterolateral hypocontractility.  EF estimate 55%; LVEDP 25 mm.  Treatments: surgery:   Procedure:        Coronary Artery Bypass Grafting x 4             Left Internal Mammary Artery to Distal Left Anterior Descending Coronary Artery; pedicled right internal mammary artery graft to posterior Descending Coronary Artery; left radial artery graft to obtuse Marginal Branch of Left Circumflex Coronary Artery and ramus intermedius coronary Artery as a sequenced graft Open left radial artery Harvest  Bilateral internal mammary artery harvesting Completion graft surveillance with indocyanine green fluorescence imaging  Surgeon:        B.  Lorayne Marek, MD  Assistant:       Jaclyn Prime, PA-C   Discharge Exam: Blood pressure (!) 159/71, pulse 90, temperature 98.3 F (36.8 C), temperature source Oral, resp. rate 16, height 6\' 1"  (1.854 m), weight 99.3 kg, SpO2  96 %.  General appearance: alert, cooperative and no distress Heart: regular rate and rhythm Lungs: clear to auscultation bilaterally Abdomen: soft, non-tender; bowel sounds normal; no masses,  no organomegaly Extremities: edema trace Wound: clean and dry  Discharge Medications:  The patient has been discharged on:   1.Beta Blocker:  Yes [ X  ]                              No   [   ]                              If No, reason:  2.Ace Inhibitor/ARB: Yes [   ]                                     No  [ x   ]                                     If No, reason: titration of BB  3.Statin:   Yes [ X  ]                  No  [   ]                  If No, reason:  4.Ecasa:  Yes  [ X  ]                  No   [   ]                  If No, reason:    Discharge Instructions    Amb Referral to Cardiac Rehabilitation   Complete by: As directed    Referring to [place] CRP 2   Diagnosis: CABG   CABG X ___: 4   After initial evaluation and assessments completed: Virtual Based Care may be provided alone or in  conjunction with Phase 2 Cardiac Rehab based on patient barriers.: Yes     Allergies as of 11/26/2020      Reactions   Morphine    Hot, sweats, flushed      Medication List    STOP taking these medications   metoprolol succinate 50 MG 24 hr tablet Commonly known as: TOPROL-XL   sildenafil 50 MG tablet Commonly known as: Viagra     TAKE these medications   acetaminophen 325 MG tablet Commonly known as: TYLENOL Take 2 tablets (650 mg total) by mouth every 4 (four) hours as needed for headache or mild pain.   Asacol HD 800 MG Tbec Generic drug: Mesalamine Take 800 mg by mouth 3 (three) times daily.   aspirin 81 MG tablet Take 81 mg by mouth daily.   atorvastatin 80 MG tablet Commonly known as: LIPITOR Take 1 tablet (80 mg total) by mouth daily. What changed:   medication strength  how much to take  when to take this   colchicine 0.6 MG tablet Take 0.5 tablets (0.3 mg total) by mouth 2 (two) times daily. You may stop this medication if diarrhea develops   furosemide 40 MG tablet Commonly known as: LASIX Take 1 tablet (40 mg total) by mouth daily.   isosorbide  dinitrate 10 MG tablet Commonly known as: ISORDIL Take 1 tablet (10 mg total) by mouth 3 (three) times daily.   loratadine 10 MG tablet Commonly known as: CLARITIN Take 10 mg by mouth daily.   metoprolol tartrate 50 MG tablet Commonly known as: LOPRESSOR Take 1 tablet (50 mg total) by mouth 2 (two) times daily.   nitroGLYCERIN 0.4 MG SL tablet Commonly known as: NITROSTAT Place 0.4 mg under the tongue every 5 (five) minutes as needed for chest pain.   omeprazole 20 MG tablet Commonly known as: PRILOSEC OTC Take 20 mg by mouth daily.   potassium chloride SA 20 MEQ tablet Commonly known as: KLOR-CON Take 1 tablet (20 mEq total) by mouth daily.   tamsulosin 0.4 MG Caps capsule Commonly known as: FLOMAX Take 0.4 mg by mouth daily.   traMADol 50 MG tablet Commonly known as: ULTRAM Take 1-2  tablets (50-100 mg total) by mouth every 4 (four) hours as needed for moderate pain.   traZODone 50 MG tablet Commonly known as: DESYREL Take 50 mg by mouth at bedtime as needed for sleep.            Durable Medical Equipment  (From admission, onward)         Start     Ordered   11/25/20 0840  For home use only DME Walker rolling  Once       Question Answer Comment  Walker: With 5 Inch Wheels   Patient needs a walker to treat with the following condition S/P CABG (coronary artery bypass graft)      11/25/20 0839   11/25/20 0840  For home use only DME 3 n 1  Once        11/25/20 2458          Follow-up Information    [name], MD.   Specialty: Cardiothoracic Surgery Contact information: [address] [place] [address] [place] [address] [phone]        Corrin Parker, PA-C Follow up on 12/11/2020.   Specialties: Physician Assistant, Cardiology Why: Please arrive 15 minutes early for your 11:15am post-hospital cardiology appointment Contact information: [address] [place] [address] [place] [address] [phone]               Signed: Lowella Dandy, PA-C 11/26/2020, 7:23 AM

## 2020-11-23 NOTE — Evaluation (Signed)
Physical Therapy Evaluation Patient Details Name: Martin [name] [name]: [id number] DOB: 1945-09-10 Today's Date: 11/23/2020   History of Present Illness  75 yo presented for outpatient cardiac cath 3/22 with admission to TCTS service same day for CABG consideration. CABGx 4 performed 3/23 with left radial artery harvest. PMHx: GERD, BPH, CAD, prostate CA  Clinical Impression  Pt very pleasant but HOH and able to transition to EOB, walking and up to chair. Pt educated for all precautions with handout provided and encouraged to be OOB for meals and walking 3x /day. Pt with decreased strength, transfers and functional mobility who will benefit from acute therapy to maximize mobility, safety and function to decrease burden of care and adherence to precautions.   HR 84-93 SpO2 94% on 1L at rest with drop to 88% on RA with return to 1L and bump to 3L during gait to maintain >90% Return to 1L at rest end of session at 93% Pre gait 110/69 Post gait 138/67     Follow Up Recommendations Home health PT    Equipment Recommendations  Rolling walker with 5" wheels;3in1 (PT)    Recommendations for Other Services       Precautions / Restrictions Precautions Precautions: Fall;Sternal;Other (comment) Precaution Booklet Issued: Yes (comment) Precaution Comments: VAC, pacer Restrictions Weight Bearing Restrictions: Yes (sternal precautions)      Mobility  Bed Mobility Overal bed mobility: Needs Assistance Bed Mobility: Rolling;Sidelying to Sit Rolling: Min assist Sidelying to sit: Min assist       General bed mobility comments: cues for sequence with assist to roll to right and rise from side with cues for sequence and precautions    Transfers Overall transfer level: Needs assistance   Transfers: Sit to/from Stand Sit to Stand: Min assist         General transfer comment: cues for hand placement and anterior translation with assist to rise  Ambulation/Gait Ambulation/Gait  assistance: Min guard Gait Distance (Feet): 200 Feet Assistive device: Rolling walker (2 wheeled) Gait Pattern/deviations: Step-through pattern;Decreased stride length   Gait velocity interpretation: 1.31 - 2.62 ft/sec, indicative of limited community ambulator General Gait Details: cues for posture and position in RW  Stairs            Wheelchair Mobility    Modified Rankin (Stroke Patients Only)       Balance Overall balance assessment: Mild deficits observed, not formally tested                                           Pertinent Vitals/Pain Pain Assessment: 0-10 Pain Score: 4  Pain Location: chest tightness after gait Pain Descriptors / Indicators: Tightness Pain Intervention(s): Limited activity within patient's tolerance;Monitored during session;Repositioned;Premedicated before session    Home Living Family/patient expects to be discharged to:: Private residence Living Arrangements: Spouse/significant other Available Help at Discharge: Family;Available 24 hours/day Type of Home: House Home Access: Stairs to enter   [employer] of Steps: 2 Home Layout: Two level;Bed/bath upstairs Home Equipment: None      Prior Function Level of Independence: Independent               Hand Dominance        Extremity/Trunk Assessment   Upper Extremity Assessment Upper Extremity Assessment: Overall WFL for tasks assessed    Lower Extremity Assessment Lower Extremity Assessment: Overall WFL for tasks assessed    Cervical /  Trunk Assessment Cervical / Trunk Assessment: Normal  Communication   Communication: HOH  Cognition Arousal/Alertness: Awake/alert Behavior During Therapy: WFL for tasks assessed/performed Overall Cognitive Status: Within Functional Limits for tasks assessed                                        General Comments      Exercises     Assessment/Plan    PT Assessment Patient needs  continued PT services  PT Problem List Decreased strength;Decreased mobility;Decreased activity tolerance;Decreased balance;Decreased knowledge of use of DME;Pain;Cardiopulmonary status limiting activity       PT Treatment Interventions Stair training;Functional mobility training;Therapeutic activities;Patient/family education;DME instruction;Therapeutic exercise;Gait training    PT Goals (Current goals can be found in the Care Plan section)  Acute Rehab PT Goals Patient Stated Goal: return home. Enjoys skeet shooting but hasn't done it for 2 years PT Goal Formulation: With patient/family Time For Goal Achievement: 12/07/20 Potential to Achieve Goals: Good    Frequency Min 3X/week   Barriers to discharge        Co-evaluation               AM-PAC PT "6 Clicks" Mobility  Outcome Measure Help needed turning from your back to your side while in a flat bed without using bedrails?: A Little Help needed moving from lying on your back to sitting on the side of a flat bed without using bedrails?: A Little Help needed moving to and from a bed to a chair (including a wheelchair)?: A Little Help needed standing up from a chair using your arms (e.g., wheelchair or bedside chair)?: A Little Help needed to walk in hospital room?: A Little Help needed climbing 3-5 steps with a railing? : A Lot 6 Click Score: 17    End of Session Equipment Utilized During Treatment: Gait belt;Oxygen Activity Tolerance: Patient tolerated treatment well Patient left: in chair;with call bell/phone within reach;with nursing/sitter in room;with family/visitor present Nurse Communication: Mobility status;Precautions PT Visit Diagnosis: Other abnormalities of gait and mobility (R26.89);Difficulty in walking, not elsewhere classified (R26.2)    Time: 6160-7371 PT Time Calculation (min) (ACUTE ONLY): 32 min   Charges:   PT Evaluation $PT Eval Moderate Complexity: 1 Mod PT Treatments $Gait Training: 8-22  mins       Niva Murren P, PT Acute Rehabilitation Services Pager: [phone] Office: [phone]   [name] 11/23/2020, 12:44 PM

## 2020-11-23 NOTE — Telephone Encounter (Signed)
Patient has a TOC hospital follow up appointment scheduled for 12/11/20 at 11:15 AM with Marjie Skiff, PA, per Judy Pimple, PA. Dot Lanes states the patient will not be discharged until next week, so please do not call until after 11/27/20. Thank you

## 2020-11-23 NOTE — Progress Notes (Signed)
   Awake, oriented, a pacing.  Stable from cardiac standpoint.

## 2020-11-24 ENCOUNTER — Inpatient Hospital Stay (HOSPITAL_COMMUNITY): Payer: Medicare PPO

## 2020-11-24 ENCOUNTER — Encounter (HOSPITAL_COMMUNITY): Payer: Self-pay | Admitting: Cardiovascular Disease

## 2020-11-24 LAB — CBC
HCT: 27.5 % — ABNORMAL LOW (ref 39.0–52.0)
Hemoglobin: 9 g/dL — ABNORMAL LOW (ref 13.0–17.0)
MCH: 30.3 pg (ref 26.0–34.0)
MCHC: 32.7 g/dL (ref 30.0–36.0)
MCV: 92.6 fL (ref 80.0–100.0)
Platelets: 183 10*3/uL (ref 150–400)
RBC: 2.97 MIL/uL — ABNORMAL LOW (ref 4.22–5.81)
RDW: 14.3 % (ref 11.5–15.5)
WBC: 9 10*3/uL (ref 4.0–10.5)
nRBC: 0 % (ref 0.0–0.2)

## 2020-11-24 LAB — BPAM RBC
Blood Product Expiration Date: 202204142359
Blood Product Expiration Date: 202204152359
Blood Product Expiration Date: 202204152359
Blood Product Expiration Date: 202204162359
Blood Product Expiration Date: 202204172359
Blood Product Expiration Date: 202204172359
ISSUE DATE / TIME: 202203231018
ISSUE DATE / TIME: 202203231018
ISSUE DATE / TIME: 202203231126
ISSUE DATE / TIME: 202203231126
Unit Type and Rh: 600
Unit Type and Rh: 600
Unit Type and Rh: 600
Unit Type and Rh: 600
Unit Type and Rh: 600
Unit Type and Rh: 600

## 2020-11-24 LAB — TYPE AND SCREEN
ABO/RH(D): A NEG
Antibody Screen: NEGATIVE
Unit division: 0
Unit division: 0
Unit division: 0
Unit division: 0
Unit division: 0
Unit division: 0

## 2020-11-24 LAB — BASIC METABOLIC PANEL
Anion gap: 7 (ref 5–15)
BUN: 35 mg/dL — ABNORMAL HIGH (ref 8–23)
CO2: 27 mmol/L (ref 22–32)
Calcium: 8.4 mg/dL — ABNORMAL LOW (ref 8.9–10.3)
Chloride: 100 mmol/L (ref 98–111)
Creatinine, Ser: 1.21 mg/dL (ref 0.61–1.24)
GFR, Estimated: >60 mL/min (ref >60)
Glucose, Bld: 132 mg/dL — ABNORMAL HIGH (ref 70–99)
Potassium: 3.9 mmol/L (ref 3.5–5.1)
Sodium: 134 mmol/L — ABNORMAL LOW (ref 135–145)

## 2020-11-24 LAB — ECHO INTRAOPERATIVE TEE
Height: 73 in
Weight: 3633.6 oz

## 2020-11-24 LAB — GLUCOSE, CAPILLARY: Glucose-Capillary: 92 mg/dL (ref 70–99)

## 2020-11-24 MED ORDER — FUROSEMIDE 40 MG PO TABS
40.0000 mg | ORAL_TABLET | Freq: Every day | ORAL | Status: DC
  Administered 2020-11-25 – 2020-11-26 (×2): 40 mg via ORAL
  Filled 2020-11-24 (×2): qty 1

## 2020-11-24 MED ORDER — SODIUM CHLORIDE 0.9 % IV SOLN: 250.0000 mL | INTRAVENOUS | Status: DC | PRN

## 2020-11-24 MED ORDER — METOPROLOL TARTRATE 25 MG PO TABS
25.0000 mg | ORAL_TABLET | Freq: Two times a day (BID) | ORAL | Status: DC
  Administered 2020-11-24 – 2020-11-25 (×3): 25 mg via ORAL
  Filled 2020-11-24 (×3): qty 1

## 2020-11-24 MED ORDER — SODIUM CHLORIDE 0.9% FLUSH: 3.0000 mL | INTRAVENOUS | Status: DC | PRN

## 2020-11-24 MED ORDER — SODIUM CHLORIDE 0.9% FLUSH
3.0000 mL | Freq: Two times a day (BID) | INTRAVENOUS | Status: DC
  Administered 2020-11-24 – 2020-11-25 (×2): 3 mL via INTRAVENOUS

## 2020-11-24 MED ORDER — POTASSIUM CHLORIDE CRYS ER 20 MEQ PO TBCR
40.0000 meq | EXTENDED_RELEASE_TABLET | Freq: Every day | ORAL | Status: DC
  Administered 2020-11-24 – 2020-11-26 (×3): 40 meq via ORAL
  Filled 2020-11-24 (×3): qty 2

## 2020-11-24 MED ORDER — ~~LOC~~ CARDIAC SURGERY, PATIENT & FAMILY EDUCATION: Freq: Once | Status: AC

## 2020-11-24 NOTE — Progress Notes (Signed)
[id number] Pt stated he had walked three times already. Pt not sure if he wants walker for home or not. Will decide closer to discharge. Education completed with pt, wife and daughter who voiced understanding. Discussed sternal precautions and staying in the tube. Gave walking instructions for ex. Discussed CRP 2 and will send referral letter to [facility]. Gave heart healthy diet. Encouraged IS. Pt demonstrated correctly over 1000 ml.  Will follow up Monday if not d/ced. Luetta Nutting RN BSN 11/24/2020 3:09 PM

## 2020-11-24 NOTE — Progress Notes (Signed)
      [address]       [place] [address]             [phone]                 3 Days Post-Op Procedure(s) (LRB): CORONARY ARTERY BYPASS GRAFTING (CABG) TIMES FOUR ON PUMP USING BILATERAL INTERNAL MAMMARY ARTERIES AND LEFT RADIAL ARTERY (N/A) RADIAL ARTERY HARVEST (Left) TRANSESOPHAGEAL ECHOCARDIOGRAM (TEE) (N/A) INDOCYANINE GREEN FLUORESCENCE IMAGING (ICG) (N/A)   Events: No events _______________________________________________________________ Vitals: BP 139/76   Pulse 89   Temp 98 F (36.7 C) (Oral)   Resp (!) 5   Ht 6\' 1"  (1.854 m)   Wt 104.2 kg   SpO2 92%   BMI 30.31 kg/m   - Neuro: alert NAD  - Cardiovascular: sinus tach  Drips: none.      - Pulm: EWOB    ABG    Component Value Date/Time   PHART 7.301 (L) 11/21/2020 2046   PCO2ART 43.3 11/21/2020 2046   PO2ART 81 (L) 11/21/2020 2046   HCO3 21.3 11/21/2020 2046   TCO2 23 11/21/2020 2046   ACIDBASEDEF 5.0 (H) 11/21/2020 2046   O2SAT 95.0 11/21/2020 2046    - Abd: soft - Extremity: warm  .Intake/Output      03/25 0701 03/26 0700 03/26 0701 03/27 0700   I.V. (mL/kg) 0.7 (0)    IV Piggyback 100.5    Total Intake(mL/kg) 101.3 (1)    Urine (mL/kg/hr) 4790 (1.9)    Drains     Chest Tube 125    Total Output 4915    Net -4813.8            _______________________________________________________________ Labs: CBC Latest Ref Rng & Units 11/24/2020 11/23/2020 11/22/2020  WBC 4.0 - 10.5 K/uL 9.0 9.9 12.5(H)  Hemoglobin 13.0 - 17.0 g/dL 9.0(L) 8.6(L) 10.1(L)  Hematocrit 39.0 - 52.0 % 27.5(L) 28.1(L) 32.4(L)  Platelets 150 - 400 K/uL 183 173 186   CMP Latest Ref Rng & Units 11/24/2020 11/23/2020 11/22/2020  Glucose 70 - 99 mg/dL 11/24/2020) 269(S) 854(O)  BUN 8 - 23 mg/dL 270(J) 50(K) 22  Creatinine 0.61 - 1.24 mg/dL 93(G 1.82 9.93  Sodium 135 - 145 mmol/L 134(L) 134(L) 135  Potassium 3.5 - 5.1 mmol/L 3.9 4.6 4.5  Chloride 98 - 111 mmol/L 100 105 104  CO2 22 - 32 mmol/L 27 26 25    Calcium 8.9 - 10.3 mg/dL 7.16) ) 9.6(V)  Total Protein 6.5 - 8.1 g/dL - - -  Total Bilirubin 0.3 - 1.2 mg/dL - - -  Alkaline Phos 38 - 126 U/L - - -  AST 15 - 41 U/L - - -  ALT 0 - 44 U/L - - -    CXR: clear  _______________________________________________________________  Assessment and Plan: POD 3 s/p CABG  Neuro: Pain controlled CV: increase BB.  Will remove pacing wires. Pulm: continue pulm toilet Renal: creat slightly up.  Switching lasix to PO GI: on diet Heme: stable ID: afebrile Endo: SSI Dispo: floor today   Martin [name] 11/24/2020 9:30 AM

## 2020-11-24 NOTE — Plan of Care (Signed)
  Problem: Education: Goal: Knowledge of disease or condition will improve Outcome: Progressing   Problem: Activity: Goal: Risk for activity intolerance will decrease Outcome: Progressing   Problem: Cardiac: Goal: Will achieve and/or maintain hemodynamic stability Outcome: Progressing   Problem: Respiratory: Goal: Respiratory status will improve Outcome: Progressing   Problem: Urinary Elimination: Goal: Ability to achieve and maintain adequate renal perfusion and functioning will improve Outcome: Progressing   Problem: Nutrition: Goal: Adequate nutrition will be maintained Outcome: Progressing

## 2020-11-25 ENCOUNTER — Inpatient Hospital Stay (HOSPITAL_COMMUNITY): Payer: Medicare PPO

## 2020-11-25 LAB — CBC
HCT: 26 % — ABNORMAL LOW (ref 39.0–52.0)
Hemoglobin: 8.5 g/dL — ABNORMAL LOW (ref 13.0–17.0)
MCH: 30.1 pg (ref 26.0–34.0)
MCHC: 32.7 g/dL (ref 30.0–36.0)
MCV: 92.2 fL (ref 80.0–100.0)
Platelets: 220 10*3/uL (ref 150–400)
RBC: 2.82 MIL/uL — ABNORMAL LOW (ref 4.22–5.81)
RDW: 14.2 % (ref 11.5–15.5)
WBC: 8.2 10*3/uL (ref 4.0–10.5)
nRBC: 0 % (ref 0.0–0.2)

## 2020-11-25 LAB — BASIC METABOLIC PANEL
Anion gap: 9 (ref 5–15)
BUN: 34 mg/dL — ABNORMAL HIGH (ref 8–23)
CO2: 26 mmol/L (ref 22–32)
Calcium: 8.4 mg/dL — ABNORMAL LOW (ref 8.9–10.3)
Chloride: 100 mmol/L (ref 98–111)
Creatinine, Ser: 1.16 mg/dL (ref 0.61–1.24)
GFR, Estimated: >60 mL/min (ref >60)
Glucose, Bld: 101 mg/dL — ABNORMAL HIGH (ref 70–99)
Potassium: 3.7 mmol/L (ref 3.5–5.1)
Sodium: 135 mmol/L (ref 135–145)

## 2020-11-25 MED ORDER — METOPROLOL TARTRATE 50 MG PO TABS
50.0000 mg | ORAL_TABLET | Freq: Two times a day (BID) | ORAL | Status: DC
  Administered 2020-11-25 – 2020-11-26 (×2): 50 mg via ORAL
  Filled 2020-11-25 (×2): qty 1

## 2020-11-25 NOTE — Progress Notes (Addendum)
      [address]       [place] [address]             [phone]      4 Days Post-Op Procedure(s) (LRB): CORONARY ARTERY BYPASS GRAFTING (CABG) TIMES FOUR ON PUMP USING BILATERAL INTERNAL MAMMARY ARTERIES AND LEFT RADIAL ARTERY (N/A) RADIAL ARTERY HARVEST (Left) TRANSESOPHAGEAL ECHOCARDIOGRAM (TEE) (N/A) INDOCYANINE GREEN FLUORESCENCE IMAGING (ICG) (N/A)   Subjective:  Up in bed no complaints.  Would like to go home today.  Objective: Vital signs in last 24 hours: Temp:  [98 F (36.7 C)-98.4 F (36.9 C)] 98.3 F (36.8 C) (03/27 0700) Pulse Rate:  [83-102] 91 (03/27 0700) Cardiac Rhythm: Normal sinus rhythm (03/26 1900) Resp:  [10-22] 17 (03/27 0700) BP: (110-156)/(57-73) 134/72 (03/27 0700) SpO2:  [89 %-98 %] 96 % (03/27 0700) Weight:  [101.6 kg] 101.6 kg (03/27 0556)   Intake/Output from previous day: 03/26 0701 - 03/27 0700 In: 360 [P.O.:360] Out: 850 [Urine:850] Intake/Output this shift: Total I/O In: 110 [P.O.:110] Out: -   General appearance: alert, cooperative and no distress Heart: regular rate and rhythm Lungs: clear to auscultation bilaterally Abdomen: soft, non-tender; bowel sounds normal; no masses,  no organomegaly Extremities: edema trace Wound: clean and dry, pravena on sternotomy  Lab Results: Recent Labs    11/24/20 0051 11/25/20 0101  WBC 9.0 8.2  HGB 9.0* 8.5*  HCT 27.5* 26.0*  PLT 183 220   BMET:  Recent Labs    11/24/20 0051 11/25/20 0101  NA 134* 135  K 3.9 3.7  CL 100 100  CO2 27 26  GLUCOSE 132* 101*  BUN 35* 34*  CREATININE 1.21 1.16  CALCIUM 8.4* 8.4*    PT/INR: No results for input(s): LABPROT, INR in the last 72 hours. ABG    Component Value Date/Time   PHART 7.301 (L) 11/21/2020 2046   HCO3 21.3 11/21/2020 2046   TCO2 23 11/21/2020 2046   ACIDBASEDEF 5.0 (H) 11/21/2020 2046   O2SAT 95.0 11/21/2020 2046   CBG (last 3)  Recent Labs    11/23/20 1131 11/23/20 1516 11/23/20 1933  GLUCAP  117* 148* 158*    Assessment/Plan: S/P Procedure(s) (LRB): CORONARY ARTERY BYPASS GRAFTING (CABG) TIMES FOUR ON PUMP USING BILATERAL INTERNAL MAMMARY ARTERIES AND LEFT RADIAL ARTERY (N/A) RADIAL ARTERY HARVEST (Left) TRANSESOPHAGEAL ECHOCARDIOGRAM (TEE) (N/A) INDOCYANINE GREEN FLUORESCENCE IMAGING (ICG) (N/A)  1. CV- NSR, Tachycardia at times + PVCS, + HTN- will increase Lopressor to 50 mg BID, continue Isordil for radial artery 2. Pulm-no acute issues, CXR with trace left pleural effusion, continue IS 3. Renal-creatinine has been stable, weight is stable continue Lasix, potassium 4. Expected post operative blood loss anemia, stable Hgb at 8.5 5. Deconditioning- pT recs home health PT, will place orders and arrange 6. dispo- patient stable, will increase Lopressor for tachycardia, PVCs, elevated BP, place orders for H/H... I think patient needs to stay in hospital today for titration of BB, if remains stable can plan to d/c home in AM   LOS: 5 days    Lowella Dandy, PA-C 11/25/2020   Agree with above Doing well titrating BB Likely home tomorrow  Corliss Skains

## 2020-11-25 NOTE — Progress Notes (Signed)
Occupational Therapy Evaluation Patient Details Name: Martin [name] [name]: [id number] DOB: 08/17/1946 Today's Date: 11/25/2020    History of Present Illness 75 yo presented for outpatient cardiac cath 3/22 with admission to TCTS service same day for CABG consideration. CABGx 4 performed 3/23 with left radial artery harvest. PMHx: GERD, BPH, CAD, prostate CA   Clinical Impression   Prior to hospitalization, pt was living with his spouse in a multi-level home with 2 STE and the option to live on the 1st floor. Pt reports performing all ADLs/ADL mobility with independence and no use of an adaptive device. Pt reports having the ability to pay for bills and manage medications independently. Pt depends on wife for other IADLs like cooking, cleaning, laundry, driving.  Today, pt received semi-reclined in bed, pt agreeable to OT eval. Pt's sternal wound vac in place and pt resting on RA with SPO2 registering at 92-95%. Pt oriented Ox4 and able to verbalize sternal precautions before functional movement. Currently, pt requires supervision for bed mobility, min assist for sit>stand from EOB, min guard for functional mobility/higher surface transfers using RW for support (in room and hallway), min guard for UB self-care, and min guard for LB self-care. Educated pt on activity pacing, PLB, sternal precautions, log rolling, AE/DME, RW safety management, and process of acute care OT/beyond. Pt's wife can provide 24/7 S/A as needed upon d/c. Care team may discharge pt home later today. OT will continue to follow pt in-house as able. Pt would benefit from continued skilled acute care OT services to maximize independence with ADLs/ADL mobility. See recs below.    Follow Up Recommendations  Home health OT;Supervision/Assistance - 24 hour    Equipment Recommendations  Tub/shower bench;Other (comment) (RW)    Recommendations for Other Services Other (comment) (None)     Precautions / Restrictions  Precautions Precautions: Fall;Sternal;Other (comment) Precaution Booklet Issued: Yes (comment) Precaution Comments: VAC, pacer Restrictions Weight Bearing Restrictions: Yes (sternal precautions)      Mobility Bed Mobility Overal bed mobility: Modified Independent (using log rolling technique and heart pillow) Bed Mobility: Rolling;Sidelying to Sit Rolling: Modified independent (Device/Increase time) Sidelying to sit: Supervision (while holding heart pillow)       General bed mobility comments: log rolling, able to follow sternal precautions well    Transfers Overall transfer level: Needs assistance Equipment used: Rolling walker (2 wheeled) Transfers: Sit to/from Stand Sit to Stand: Min assist         General transfer comment: cues for hand placement, min assist for powering up    Balance Overall balance assessment: No apparent balance deficits (not formally assessed)       ADL either performed or assessed with clinical judgement   ADL Overall ADL's : Needs assistance/impaired Eating/Feeding: Independent;Sitting   Grooming: Wash/dry hands;Wash/dry face;Oral care;Min guard;Standing   Upper Body Bathing: Minimal assistance;Sitting (simulated sitting EOB; sponge bathe for now 2/2 wound vac to sternum)   Lower Body Bathing: Min guard;Sitting/lateral leans;Sit to/from stand (simulated in sitting/standing using RW for support)   Upper Body Dressing : Set up;Sitting   Lower Body Dressing: Set up;Sitting/lateral leans (for bilateral sock management sitting EOB; using figure four technique)   Toilet Transfer: Min guard;Ambulation;Comfort height toilet;RW   Toileting- Clothing Manipulation and Hygiene: Min guard;Sitting/lateral lean;Sit to/from stand (simulated in sitting/standing with RW support)   Tub/ Engineer, structural:  (not assessed 2/2 wound vac to chest and plan to sponge bathe for now at home until cleared for showers; advised pt to purchase shower chair in  future  if needed to take rest breaks)   Functional mobility during ADLs: Min guard;Rolling walker (assist with line management (wound vac))      Vision Baseline Vision/History: Wears glasses Wears Glasses: At all times Patient Visual Report: No change from baseline Vision Assessment?: No apparent visual deficits     Perception Perception Perception Tested?: No   Praxis Praxis Praxis tested?: Not tested    Pertinent Vitals/Pain Pain Assessment: No/denies pain Pain Score: 0-No pain     Hand Dominance Right   Extremity/Trunk Assessment Upper Extremity Assessment Upper Extremity Assessment: Overall WFL for tasks assessed   Lower Extremity Assessment Lower Extremity Assessment: Overall WFL for tasks assessed   Cervical / Trunk Assessment Cervical / Trunk Assessment: Normal   Communication Communication Communication: HOH   Cognition Arousal/Alertness: Awake/alert Behavior During Therapy: WFL for tasks assessed/performed Overall Cognitive Status: Within Functional Limits for tasks assessed     General Comments  slight edema to L forearm from incision site, wound vac to chest, tingling/numbness to L hand      Home Living Family/patient expects to be discharged to:: Private residence Living Arrangements: Spouse/significant other Available Help at Discharge: Family;Available 24 hours/day Type of Home: House Home Access: Stairs to enter [employer] of Steps: 2 Entrance Stairs-Rails: None Home Layout: Two level;Able to live on main level with bedroom/bathroom;Other (Comment) (tub/shower on main floor) Alternate Level Stairs-Number of Steps: 17 Alternate Level Stairs-Rails: Left Bathroom Shower/Tub: Walk-in shower;Tub/shower unit   Bathroom Toilet: Handicapped height Bathroom Accessibility: Yes How Accessible: Accessible via walker Home Equipment: None          Prior Functioning/Environment Level of Independence: Independent        Comments: Independent  with ADLs/ADL mobility; dependent on wife for IADLs        OT Problem List: Decreased strength;Decreased activity tolerance;Decreased knowledge of precautions      OT Treatment/Interventions: Self-care/ADL training;Therapeutic exercise;Energy conservation;DME and/or AE instruction;Therapeutic activities;Patient/family education    OT Goals(Current goals can be found in the care plan section) Acute Rehab OT Goals Patient Stated Goal: return home OT Goal Formulation: With patient Time For Goal Achievement: 12/09/20 Potential to Achieve Goals: Good  OT Frequency: Min 2X/week    AM-PAC OT "6 Clicks" Daily Activity     Outcome Measure Help from another person eating meals?: None Help from another person taking care of personal grooming?: A Little Help from another person toileting, which includes using toliet, bedpan, or urinal?: A Little Help from another person bathing (including washing, rinsing, drying)?: A Little Help from another person to put on and taking off regular upper body clothing?: A Little Help from another person to put on and taking off regular lower body clothing?: A Little 6 Click Score: 19   End of Session Equipment Utilized During Treatment: Rolling walker Nurse Communication: Mobility status  Activity Tolerance: Patient tolerated treatment well;Patient limited by fatigue Patient left: Other (comment);with call bell/phone within reach (sitting EOB)  OT Visit Diagnosis: Muscle weakness (generalized) (M62.81);Other (comment) (decreased activity tolerance)                Time: 3875-6433 OT Time Calculation (min): 43 min Charges:  OT General Charges $OT Visit: 1 Visit OT Evaluation $OT Eval Moderate Complexity: 1 Mod OT Treatments $Self Care/Home Management : 8-22 mins $Therapeutic Activity: 8-22 mins  Norris Cross, OTR/L [facility] [phone]   Mechele Claude 11/25/2020, 10:42 AM

## 2020-11-25 NOTE — TOC Initial Note (Signed)
Transition of Care (TOC) - Initial/Assessment Note  Donn Pierini RN, BSN Transitions of Care Unit 4E- RN Case Manager See Treatment Team for direct phone #    Patient Details  Name: Martin [name] [name]: [id number] Date of Birth: August 31, 1946  Transition of Care [facility]) CM/SW Contact:    Darrold Span, RN Phone Number: 11/25/2020, 1:20 PM  Clinical Narrative:                 Pt from home with wife, s/p CABG. Orders placed for [facility] and DME needs. Cm in to speak with pt, wife and daughter also at the bedside. Discussed HH and DME needs. List provided for [facility] choice Per CMS guidelines from medicare.gov website with star ratings (copy placed in shadow chart), per choice they would like to try [facility] first with [facility] as alternate choice.  Agreeable to using in house DME provider [facility] to have DME delivered to the room prior to discharge.   Call made to [employer] for DME needs- will have 3n1 and RW delivered to room prior to discharge.   Will call [facility] agencies in the AM for [facility] referrals- will need OT added to orders as per OT recs today.     Expected Discharge Plan: Home w Home Health Services Barriers to Discharge: Continued Medical Work up   Patient Goals and CMS Choice Patient states their goals for this hospitalization and ongoing recovery are:: return home and be able to [facility] lawn and do things that need to be done around the house CMS Medicare.gov Compare Post Acute Care list provided to:: Patient Choice offered to / list presented to : Patient,Spouse,Adult Children  Expected Discharge Plan and Services Expected Discharge Plan: Home w Home Health Services   Discharge Planning Services: CM Consult Post Acute Care Choice: Durable Medical Equipment,Home Health Living arrangements for the past 2 months: Single Family Home                 DME Arranged: 3-N-1,Walker rolling DME Agency: [facility] Date DME Agency Contacted: 11/25/20 Time DME Agency Contacted:  1316 Representative spoke with at DME Agency: Eyvonne Mechanic HH Arranged: PT,OT          Prior Living Arrangements/Services Living arrangements for the past 2 months: Single Family Home Lives with:: Spouse Patient language and need for interpreter reviewed:: Yes Do you feel safe going back to the place where you live?: Yes      Need for Family Participation in Patient Care: Yes (Comment) Care giver support system in place?: Yes (comment)   Criminal Activity/Legal Involvement Pertinent to Current Situation/Hospitalization: No - Comment as needed  Activities of Daily Living Home Assistive Devices/Equipment: None ADL Screening (condition at time of admission) Patient's cognitive ability adequate to safely complete daily activities?: Yes Is the patient deaf or have difficulty hearing?: No Does the patient have difficulty seeing, even when wearing glasses/contacts?: No Does the patient have difficulty concentrating, remembering, or making decisions?: No Patient able to express need for assistance with ADLs?: Yes Does the patient have difficulty dressing or bathing?: No Independently performs ADLs?: Yes (appropriate for developmental age) Does the patient have difficulty walking or climbing stairs?: No Weakness of Legs: None Weakness of Arms/Hands: None  Permission Sought/Granted Permission sought to share information with : Facility Industrial/product designer granted to share information with : Yes, Verbal Permission Granted     Permission granted to share info w AGENCY: HH and DME        Emotional Assessment  Appearance:: Appears stated age Attitude/Demeanor/Rapport: Engaged Affect (typically observed): Appropriate,Pleasant,Happy Orientation: : Oriented to Self,Oriented to Place,Oriented to  Time,Oriented to Situation Alcohol / Substance Use: Not Applicable Psych Involvement: No (comment)  Admission diagnosis:  CAD (coronary artery disease), native coronary artery  [I25.10] Patient Active Problem List   Diagnosis Date Noted  . 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  . CAD (coronary artery disease), native coronary artery 11/20/2020  . Abnormal nuclear stress test   . Essential hypertension 11/01/2020  . 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 Educated about COVID-19 virus infection 12/11/2019  . Dyslipidemia 07/07/2019  . Stenosis of left carotid artery 07/07/2019  . DYSLIPIDEMIA 08/01/2009  . Coronary artery disease involving native coronary artery of native heart with angina pectoris (HCC) 08/01/2009  . GERD 08/01/2009  . COLITIS, ULCERATIVE 08/01/2009  . PALPITATIONS 07/13/2009   PCP:  [name], MD Pharmacy:   [facility] [address] [place] - [address] [place] [address] Phone: [phone] Fax: [phone]  [facility] - [place], [place] - [address] [place] [address] Phone: [phone] Fax: [phone]  [id number] - [place], [place] - [address] [place] [address] Phone: [phone] Fax: [phone]     Social Determinants of Health (SDOH) Interventions    Readmission Risk Interventions Readmission Risk Prevention Plan 11/25/2020  Post Dischage Appt Complete  Medication Screening Complete  Transportation Screening Complete  Some recent data might be hidden

## 2020-11-25 NOTE — Progress Notes (Signed)
Mobility Specialist - Progress Note   11/25/20 1349  Mobility  Activity Ambulated in hall  Level of Assistance Standby assist, set-up cues, supervision of patient - no hands on  Assistive Device Front wheel walker  Distance Ambulated (ft) 400 ft  Mobility Response Tolerated well  Mobility performed by Mobility specialist  $Mobility charge 1 Mobility   Pre-mobility: 86 HR, 96% SpO2 Post-mobility: 92 HR, 95% SpO2  Pt ambulated on 2L O2, asx throughout. Pt back in bed after walk, family in room.   Mamie Levers Mobility Specialist Mobility Specialist Phone: [phone]

## 2020-11-26 MED ORDER — ACETAMINOPHEN 325 MG PO TABS: 650.0000 mg | ORAL_TABLET | ORAL | Status: DC | PRN

## 2020-11-26 MED ORDER — POTASSIUM CHLORIDE CRYS ER 20 MEQ PO TBCR: 20.0000 meq | EXTENDED_RELEASE_TABLET | Freq: Every day | ORAL | 0 refills | Status: DC

## 2020-11-26 MED ORDER — TRAMADOL HCL 50 MG PO TABS: 50.0000 mg | ORAL_TABLET | ORAL | 0 refills | Status: DC | PRN

## 2020-11-26 MED ORDER — FUROSEMIDE 40 MG PO TABS: 40.0000 mg | ORAL_TABLET | Freq: Every day | ORAL | 0 refills | Status: DC

## 2020-11-26 MED ORDER — ISOSORBIDE DINITRATE 10 MG PO TABS: 10.0000 mg | ORAL_TABLET | Freq: Three times a day (TID) | ORAL | 0 refills | Status: DC

## 2020-11-26 MED ORDER — ATORVASTATIN CALCIUM 80 MG PO TABS: 80.0000 mg | ORAL_TABLET | Freq: Every day | ORAL | 3 refills | Status: DC

## 2020-11-26 MED ORDER — METOPROLOL TARTRATE 50 MG PO TABS: 50.0000 mg | ORAL_TABLET | Freq: Two times a day (BID) | ORAL | 3 refills | Status: DC

## 2020-11-26 MED ORDER — COLCHICINE 0.6 MG PO TABS: 0.3000 mg | ORAL_TABLET | Freq: Two times a day (BID) | ORAL | 0 refills | Status: DC

## 2020-11-26 MED FILL — Mannitol IV Soln 20%: INTRAVENOUS | Qty: 500 | Status: AC

## 2020-11-26 MED FILL — Electrolyte-R (PH 7.4) Solution: INTRAVENOUS | Qty: 3000 | Status: AC

## 2020-11-26 MED FILL — Heparin Sodium (Porcine) Inj 1000 Unit/ML: INTRAMUSCULAR | Qty: 40 | Status: AC

## 2020-11-26 MED FILL — Sodium Bicarbonate IV Soln 8.4%: INTRAVENOUS | Qty: 50 | Status: AC

## 2020-11-26 MED FILL — Sodium Chloride IV Soln 0.9%: INTRAVENOUS | Qty: 3000 | Status: AC

## 2020-11-26 NOTE — Progress Notes (Signed)
CARDIAC REHAB PHASE I   PRE:  Rate/Rhythm: 79 off monitor   Per pulse ox  BP:  Supine:   Sitting: 148/70  Standing:    SaO2: 96%RA  MODE:  Ambulation: 400 ft   POST:  Rate/Rhythm: 95     SaO2: 93%RA monitored during walk [id number] Pt off monitor. Walked pt to monitor RA sats walking. Tolerated well. Walked 400 ft on RA with rolling walker. Sats maintained 93% and above. Back to bed after walk.   Luetta Nutting, RN BSN  11/26/2020 8:40 AM

## 2020-11-26 NOTE — TOC Transition Note (Signed)
Transition of Care (TOC) - CM/SW Discharge Note Donn Pierini RN, BSN Transitions of Care Unit 4E- RN Case Manager See Treatment Team for direct phone #    Patient Details  Name: Martin [name] [name]: [id number] Date of Birth: [birth year]  Transition of Care [facility]) CM/SW Contact:  Darrold Span, RN Phone Number: 11/26/2020, 10:14 AM   Clinical Narrative:    Pt stable for transition home today, call made this am to [facility] as per pt choice for [facility] needs. Spoke with [facility] regarding referral. [facility] as confirmed that they can accept referral for needed services PT/OT- they will f/u for start of care post discharge.   Call also made to [facility] to f/u on DME- RW and 3n1 to be delivered to room this AM.   Family to transport pt home.    Final next level of care: Home w Home Health Services Barriers to Discharge: Barriers Resolved   Patient Goals and CMS Choice Patient states their goals for this hospitalization and ongoing recovery are:: return home and be able to [facility] lawn and do things that need to be done around the house CMS Medicare.gov Compare Post Acute Care list provided to:: Patient Choice offered to / list presented to : Patient,Spouse,Adult Children  Discharge Placement                 Home with [facility]      Discharge Plan and Services   Discharge Planning Services: CM Consult Post Acute Care Choice: Durable Medical Equipment,Home Health          DME Arranged: 3-N-1,Walker rolling DME Agency: [facility] Date DME Agency Contacted: 11/25/20 Time DME Agency Contacted: 1316 Representative spoke with at DME Agency: Eyvonne Mechanic HH Arranged: PT,OT HH Agency[facility] Date [facility] Agency Contacted: 11/26/20 Time HH Agency Contacted: 1000 Representative spoke with at [facility] Agency: Alv[facility]  Social Determinants of Health (SDOH) Interventions     Readmission Risk Interventions Readmission Risk Prevention Plan 11/26/2020 11/25/2020  Post  Dischage Appt - Complete  Medication Screening - Complete  Transportation Screening - Complete  PCP or Specialist Appt within 5-7 Days Complete -  Home Care Screening Complete -  Medication Review (RN CM) Complete -  Some recent data might be hidden

## 2020-11-26 NOTE — Care Management Important Message (Signed)
Important Message  Patient Details  Name: Martin [name] [name]: [id number] Date of Birth: [birth year]   Medicare Important Message Given:  Yes     Renie Ora 11/26/2020, 8:47 AM

## 2020-11-26 NOTE — Progress Notes (Signed)
Order received to discharge patient.  Telemetry monitor removed and CCMD notified.  Discharge instructions, follow up, medications and instructions for their use discussed with patient.  Home Health arranged and equipment to be delivered to pt's room before discharging.

## 2020-11-26 NOTE — Progress Notes (Signed)
      [address]       [place] [address]             [phone]      5 Days Post-Op Procedure(s) (LRB): CORONARY ARTERY BYPASS GRAFTING (CABG) TIMES FOUR ON PUMP USING BILATERAL INTERNAL MAMMARY ARTERIES AND LEFT RADIAL ARTERY (N/A) RADIAL ARTERY HARVEST (Left) TRANSESOPHAGEAL ECHOCARDIOGRAM (TEE) (N/A) INDOCYANINE GREEN FLUORESCENCE IMAGING (ICG) (N/A)   Subjective:  Patient states he will only be doing well if he gets to go home today.  + ambulation  + BM  Objective: Vital signs in last 24 hours: Temp:  [98.1 F (36.7 C)-98.9 F (37.2 C)] 98.3 F (36.8 C) (03/28 0300) Pulse Rate:  [87-99] 90 (03/28 0300) Cardiac Rhythm: Normal sinus rhythm (03/27 2000) Resp:  [16-27] 16 (03/28 0300) BP: (114-159)/(71-78) 159/71 (03/28 0300) SpO2:  [94 %-100 %] 96 % (03/28 0300) Weight:  [99.3 kg] 99.3 kg (03/28 0300)  Intake/Output from previous day: 03/27 0701 - 03/28 0700 In: 420 [P.O.:420] Out: 700 [Urine:700]  General appearance: alert, cooperative and no distress Heart: regular rate and rhythm Lungs: clear to auscultation bilaterally Abdomen: soft, non-tender; bowel sounds normal; no masses,  no organomegaly Extremities: edema trace Wound: clean and dry  Lab Results: Recent Labs    11/24/20 0051 11/25/20 0101  WBC 9.0 8.2  HGB 9.0* 8.5*  HCT 27.5* 26.0*  PLT 183 220   BMET:  Recent Labs    11/24/20 0051 11/25/20 0101  NA 134* 135  K 3.9 3.7  CL 100 100  CO2 27 26  GLUCOSE 132* 101*  BUN 35* 34*  CREATININE 1.21 1.16  CALCIUM 8.4* 8.4*    PT/INR: No results for input(s): LABPROT, INR in the last 72 hours. ABG    Component Value Date/Time   PHART 7.301 (L) 11/21/2020 2046   HCO3 21.3 11/21/2020 2046   TCO2 23 11/21/2020 2046   ACIDBASEDEF 5.0 (H) 11/21/2020 2046   O2SAT 95.0 11/21/2020 2046   CBG (last 3)  Recent Labs    11/23/20 1131 11/23/20 1516 11/23/20 1933  GLUCAP 117* 148* 158*    Assessment/Plan: S/P Procedure(s)  (LRB): CORONARY ARTERY BYPASS GRAFTING (CABG) TIMES FOUR ON PUMP USING BILATERAL INTERNAL MAMMARY ARTERIES AND LEFT RADIAL ARTERY (N/A) RADIAL ARTERY HARVEST (Left) TRANSESOPHAGEAL ECHOCARDIOGRAM (TEE) (N/A) INDOCYANINE GREEN FLUORESCENCE IMAGING (ICG) (N/A)  1. CV- NSR with PVCs- rate improved- continue Lopressor, Isordil for radial artery graft 2. Pulm- no acute issues, continue IS 3. Renal- creatinine has been stable, weight is trending down,continue Lasix 4. Deconditioning- home health has been arranged 5. Dispo- patient stable, will d/c home today   LOS: 6 days    Lowella Dandy, PA-C 11/26/2020

## 2020-11-28 ENCOUNTER — Telehealth (HOSPITAL_COMMUNITY): Payer: Self-pay

## 2020-11-28 NOTE — Telephone Encounter (Signed)
Per phase 1, fax pt cardiac rehab referral to [place] [facility]. 

## 2020-12-04 ENCOUNTER — Other Ambulatory Visit: Payer: Self-pay | Admitting: Cardiothoracic Surgery

## 2020-12-04 DIAGNOSIS — Z951 Presence of aortocoronary bypass graft: Secondary | ICD-10-CM

## 2020-12-04 NOTE — Telephone Encounter (Signed)
Left message for pt to call.

## 2020-12-04 NOTE — Progress Notes (Signed)
Cardiology Office Note:    Date:  12/11/2020   ID:  Martin [name], DOB November 23, 1945, [name] [id number]  PCP:  Guadalupe Maple, MD  Cardiologist:  Minus Breeding, MD  Electrophysiologist:  None   Referring MD: Guadalupe Maple, MD   Chief Complaint: hospital follow-up for CABG  History of Present Illness:    Martin [name] is a 75 y.o. male with a history of CAD s/p multiple DES to RCA in 2008 and more recently CABG x4 in 10/2020, post-op atrial flutter on Amiodarone and Eliquis, chronic heart failure with mildly reduced EF of 45-50% on Echo on 12/05/2020, moderate pericardial effusion on Echo on 12/05/2020, moderate bilateral carotid stenosis, hypertension, dyslipidemia, pre-diabetes, GERD, ulcerative colitis, BPH, and prostate cacner who is followed by Dr. Percival Spanish and presents today for hospital follow-up following CABG.  Patient has a long history of CAD. Cardiac cath in 04/2007 showed 90-95% stenosis of RCA which was treated with DES. He had acute in-stent thrombosis on the day of PCI in the hospital and subsequently had 3 DES placed to the RCA. Patient was seen by Dr. Percival Spanish in the office on 11/02/2020 at which time he reported profound fatigue and decreased exercise tolerance as well as some chest pain and shortness of breath. Myoview was ordered for further evaluation and was abnormal with multiple defects consistent with ischemia. Decision was made to proceed with cardiac catheterization. LHC on 11/20/2020 showed severe progressive multivessel CAD. Echo showed LVEF of 55-60% with normal wall motion, mild LVH, and grade 1 diastolic dysfunction. CT surgery was consulted and patient underwent CABG x4 (LIMA to LAD, RIMA to PDA, and sequential radial artery to OM and Ramus Intermediate) on 11/21/2020. He also underwent open harvest of his left artery. He tolerated the procedure well and post-op course was unremarkable. He was started on diuretics for volume overload. Of note, IV Nitro was used postoperatively  due to radial artery graft and then he was transitioned to Isordil. He was discharged on 11/26/2020.  Patient was readmitted from 12/05/2020 to 12/06/2020 after presenting with shortness of breath, weakness, and fatigue. Upon arrival to the ED, he was hypotensive and diaphoretics; therefore, he was given IV fluids with improvement. He was found to be in atrial flutter with RVR. BNP was elevated in the 600's and high-sensitivity troponin was elevated in the 300's but flat and consistent with demand ischemia. Chest CTA which was negative for PE and dissection but did show a new small to moderate pericardial effusion. Limited Echo showed LVEF of 45-50% with severe hypokinesis of mid-apical anterior wall and anteroseptal wall, severe akinesis of the entire apical segment, and a moderate pericardial effusion with no evidence of tamponade. Initially there was concern for cardiac tamponade so he underwent RHC with Dr. Haroldine Laws on 12/05/2020 which showed low filling pressures with normal cardiac output and no evidence of tamponade. He received 2 dose of IV Lasix during admission and symptoms improved. He was started on Eliquis and Amiodarone. Plan was for TEE/DCCV but thankfully patient spontaneously converted the night before this was planned. Patient's Lopressor was transitioned to Toprol-XL and Lisinopril was added given reduced LVEF. Atrial flutter was felt to be secondary to recent CABG and reduced EF was felt to be secondary to atrial flutter. He was discharged on PRN Lasix 76m daily for edema or shortness of breath, Amiodarone 4075mtwice daily for 7 days with plans to then transition to 20046maily, Toprol-XL 100m32mily, Lisinopril 10mg70mly, Isordil 10mg 49me times daily, Aspirin 81mg51m  daily, and Eliquis 64m twice daily. Plan is to continue Amiodarone for 1 month and then this can likely be stopped. Repeat Echo ordered for 1-2 weeks for further evaluation of pericardial effusion.  Patient presents today for  follow-up. Here with wife. Patient states "I have been better." Her reports continuing to feel very weak and fatigued. He also is not sleeping or eating well. He denies any chest pain but does note some shortness of breath with exertion. I suspect some of this is due to deconditioning and patient agrees. No shortness of breath at rest. No orthopnea or PND. Weights stable at home and he as not required any PRN Lasix. He is maintaining sinus rhythm. He did have another episode of hypotension with systolic BP as low as the 70's on 12/08/2020. During this time, he states he felt like his "vision was going out." No syncope though. Patient called CT surgery on-call provider and was advised to hold his Lisinopril. BP improved with this and has had no recurrent hypotension. Patient and wife feel like patient is depressed and this is playing a large role in his recovery. Patient states "I just kind of gave up." Depression seems to be worse at night.   Of note, patient has had some mild drainage from sternotomy scar. Wife, who used to work as a nMarine scientist has changing dressing. He still has sutures in chest tube sites. He missed his appointment to have these removed when he ended up back in the hospital. I was going to remove these for him today but we did not have a suture removal kit in the office.  Patient also notes some numbness of his middle finger and ring finger on the left hand as well a decreased grip strength and trouble with opposition. May of had some damage to median nerve during surgery.  Past Medical History:  Diagnosis Date  . Benign prostatic hypertrophy   . Coronary artery disease    cath 04/2007 with 90-95% RCA stenosis. Had DES placed.  Had an acute stent thrombosis in the hospital that day and subsequently had 3 DES placed in the RCA. The LAD had 45% stenosis, in the circ had 35% stenosis. The EF was well preserved.  . Dyslipidemia   . GERD (gastroesophageal reflux disease)   . Ulcerative colitis      Past Surgical History:  Procedure Laterality Date  . CORONARY ARTERY BYPASS GRAFT N/A 11/21/2020   Procedure: CORONARY ARTERY BYPASS GRAFTING (CABG) TIMES FOUR ON PUMP USING BILATERAL INTERNAL MAMMARY ARTERIES AND LEFT RADIAL ARTERY;  Surgeon: AWonda Olds MD;  [facility]  Service: Open Heart Surgery;  Laterality: N/A;  BIMA  . INGUINAL HERNIA REPAIR  1999  . LEFT HEART CATH AND CORONARY ANGIOGRAPHY N/A 11/20/2020   Procedure: LEFT HEART CATH AND CORONARY ANGIOGRAPHY;  Surgeon: KTroy Sine MD;  [facility];  Service: Cardiovascular;  Laterality: N/A;  . RADIAL ARTERY HARVEST Left 11/21/2020   Procedure: RADIAL ARTERY HARVEST;  Surgeon: AWonda Olds MD;  [place] [facility]  Service: Open Heart Surgery;  Laterality: Left;  . RIGHT HEART CATH N/A 12/05/2020   Procedure: RIGHT HEART CATH;  Surgeon: BJolaine Artist MD;  [facility];  Service: Cardiovascular;  Laterality: N/A;  . ROTATOR CUFF REPAIR Left   . TEE WITHOUT CARDIOVERSION N/A 11/21/2020   Procedure: TRANSESOPHAGEAL ECHOCARDIOGRAM (TEE);  Surgeon: AWonda Olds MD;  [facility]  Service: Open Heart Surgery;  Laterality: N/A;  Current Medications: Current Meds  Medication Sig  . acetaminophen (TYLENOL) 325 MG tablet Take 2 tablets (650 mg total) by mouth every 4 (four) hours as needed for headache or mild pain.  Marland Kitchen apixaban (ELIQUIS) 5 MG TABS tablet Take 1 tablet (5 mg total) by mouth 2 (two) times daily.  . ASACOL HD 800 MG TBEC Take 800 mg by mouth 3 (three) times daily.  Marland Kitchen aspirin 81 MG tablet Take 81 mg by mouth daily.  Marland Kitchen atorvastatin (LIPITOR) 80 MG tablet Take 1 tablet (80 mg total) by mouth daily.  . colchicine 0.6 MG tablet Take 0.5 tablets (0.3 mg total) by mouth 2 (two) times daily. You may stop this medication if diarrhea develops  . furosemide (LASIX) 20 MG tablet Take 1 tablet (20 mg total) by mouth daily as needed for edema (or shortness of breath).  .  isosorbide dinitrate (ISORDIL) 10 MG tablet Take 1 tablet (10 mg total) by mouth 3 (three) times daily.  Marland Kitchen loratadine (CLARITIN) 10 MG tablet Take 10 mg by mouth daily.  . metoprolol succinate (TOPROL XL) 100 MG 24 hr tablet Take 1 tablet (100 mg total) by mouth daily. Take with or immediately following a meal.  . nitroGLYCERIN (NITROSTAT) 0.4 MG SL tablet Place 0.4 mg under the tongue every 5 (five) minutes as needed for chest pain.  Marland Kitchen omeprazole (PRILOSEC OTC) 20 MG tablet Take 20 mg by mouth daily.  . tamsulosin (FLOMAX) 0.4 MG CAPS capsule Take 0.4 mg by mouth daily.  . traZODone (DESYREL) 50 MG tablet Take 50 mg by mouth at bedtime as needed for sleep.  . [DISCONTINUED] amiodarone (PACERONE) 200 MG tablet Take 2 tablets (462m) twice a day for 7 days then 1 table (2016m daily     Allergies:   Morphine   Social History   Socioeconomic History  . Marital status: Married    Spouse name: CaScotland Korver. Number of children: 2  . Years of education: Not on file  . Highest education level: Associate degree: academic program  Occupational History  . Occupation: retired    Comment: [employer] Tobacco Use  . Smoking status: Never Smoker  . Smokeless tobacco: Never Used  Vaping Use  . Vaping Use: Never used  Substance and Sexual Activity  . Alcohol use: Yes    Alcohol/week: 2.0 standard drinks    Types: 2 Cans of beer per week  . Drug use: Not on file  . Sexual activity: Not Currently  Other Topics Concern  . Not on file  Social History Narrative  . Not on file   Social Determinants of Health   Financial Resource Strain: Low Risk   . Difficulty of Paying Living Expenses: Not hard at all  Food Insecurity: No Food Insecurity  . Worried About RuCharity fundraisern the Last Year: Never true  . Ran Out of Food in the Last Year: Never true  Transportation Needs: No Transportation Needs  . Lack of Transportation (Medical): No  . Lack of Transportation  (Non-Medical): No  Physical Activity: Insufficiently Active  . Days of Exercise per Week: 3 days  . Minutes of Exercise per Session: 30 min  Stress: Not on file  Social Connections: Not on file     Family History: The patient's family history includes Heart attack in his brother, father, and mother.  ROS:   Please see the history of present illness.     EKGs/Labs/Other Studies Reviewed:  The following studies were reviewed today:  Carotid Ultrasound 11/08/2020: Summary:  - Right Carotid: Velocities in the right ICA are consistent with a 40-59% stenosis. Non-hemodynamically significant plaque <50% noted in the CCA. Increase RICA velocities compared to prior exam.  - Left Carotid: Velocities in the left ICA are consistent with a 40-59% stenosis. Stable LICA velocities.   *See table(s) above for measurements and observations.  Suggest follow up study in 12 months. _______________  Myoview 11/08/2020:   There was no ST segment deviation noted during stress.  The left ventricular ejection fraction is mildly decreased (45-54%).  Nuclear stress EF: 48%.  Defect 1: There is a medium defect of severe severity present in the mid inferolateral, mid anterolateral and apical lateral location.  Defect 2: There is a medium defect of moderate severity present in the basal inferior, mid inferior and apical inferior location.  Defect 3: There is a small defect of mild severity present in the mid anterior and apical anterior location.  Defect 4: There is a small defect of mild severity present in the apex location.  Findings consistent with ischemia and prior myocardial infarction with peri-infarct ischemia.  This is a high risk study.   1. Partially reversible severe mid to apical lateral wall perfusion defect consistent with infarct with peri-infarct ischemia 2. Partially reversible basal to apical inferior wall perfusion defect consistent with infarct with per-infarct ischemia 3.  Reversible mid to apical anterior perfusion defect consistent with ischemia 4. Fixed perfusion defect at apex consistent with infarct 5. Mild systolic dysfunction (EF 62%) 6. High risk study given perfusion defects in multiple coronary territories   _______________  Left Cardiac Catheterization 11/20/2020:   Prox RCA lesion is 80% stenosed.  Mid RCA-1 lesion is 70% stenosed.  Mid RCA-2 lesion is 90% stenosed.  RPDA lesion is 60% stenosed.  1st Diag lesion is 99% stenosed.  2nd Diag lesion is 90% stenosed.  Ost LAD to Prox LAD lesion is 70% stenosed.  Prox LAD to Mid LAD lesion is 70% stenosed.  Ost Cx to Prox Cx lesion is 50% stenosed.  Prox Cx to Mid Cx lesion is 50% stenosed.  Mid LAD lesion is 95% stenosed.  Mid LAD to Dist LAD lesion is 70% stenosed.   Severe progressive multivessel CAD with 70% proximal, 70%, 95% and 70% mid LAD stenosis with a 99% stenosis in a very proximal diagonal vessel and 90% ostial stenosis in the second diagonal vessel; ostial and proximal 40 to 50% circumflex stenoses; and in-stent restenosis of 80% followed by 70% in the tandem sequential stents the RCA with new 90% stenosis beyond the stented segment and evidence for 50 to 60% ostial stenosis of a large PDA vessel.  Preserved global LV contractility with a subtle area of mild mid anterolateral hypocontractility.  EF estimate 55%; LVEDP 25 mm.  Recommendations: Surgical consultation for CABG revascularization surgery. _______________  Echocardiogram 11/20/2020: Impressions: 1. Left ventricular ejection fraction, by estimation, is 55 to 60%. The  left ventricle has normal function. The left ventricle has no regional  wall motion abnormalities. There is mild concentric left ventricular  hypertrophy. Left ventricular diastolic  parameters are consistent with Grade I diastolic dysfunction (impaired  relaxation).  2. Right ventricular systolic function is normal. The right ventricular   size is normal. Tricuspid regurgitation signal is inadequate for assessing  PA pressure.  3. The mitral valve is grossly normal. Trivial mitral valve  regurgitation. No evidence of mitral stenosis.  4. The aortic valve is tricuspid.  There is mild calcification of the  aortic valve. Aortic valve regurgitation is trivial. No aortic stenosis is  Present. _______________  Limited Echocardiogram 12/05/2020: Impressions: 1. Limited for pericardial effusion  2. Left ventricular ejection fraction, by estimation, is 45 to 50%. The  left ventricle has mildly decreased function. There is severe hypokinesis  of the left ventricular, mid-apical anterior wall and anteroseptal wall.  There is severe akinesis of the  left ventricular, entire apical segment.  3. Right ventricular systolic function was not well visualized.  4. Moderate pericardial effusion. The pericardial effusion is  circumferential. There is no evidence of cardiac tamponade.  5. The mitral valve was not assessed.  6. The aortic valve was not assessed.  7. The inferior vena cava is normal in size with greater than 50%  respiratory variability, suggesting right atrial pressure of 3 mmHg.   Comparison(s): Changes from prior study are noted. 11/20/2020: LVEF 55-60%,  trivial pericardial effusion.  _______________  Right Cardiac Catheterization 12/05/2020: Findings: Ao = 121/47 (55) LV = 125/10 RA = 1 RV =15/1 PA = 14/2 (8) PCW = 7 Fick cardiac output/index = 5.7/2.6 PVR = 0.2 WU Ao sat = 99% PA sat = 61%  Simultaneous RV/LV pressures show concordance with deep breathing  Assessment: 1. Low filling pressures with normal cardiac output and no evidence of tamponade.  Plan/Discussion: Continue medical therapy.  EKG:  EKG ordered today. EKG personally reviewed and demonstrates normal sinus rhythm, rate 73 bpm, with 1st degree AV block (PR interval 218 ms) with very mild T wave inversion/non-specific changes in  inferior leads and leads V3-V5. Normal axis. QTc 462 ms.  Recent Labs: 12/05/2020: B Natriuretic Peptide 615.2; Magnesium 1.8 12/06/2020: ALT 27; BUN 22; Creatinine, Ser 1.14; Hemoglobin 10.3; Platelets 545; Potassium 3.6; Sodium 137  Recent Lipid Panel    Component Value Date/Time   CHOL 129 12/06/2020 0225   TRIG 170 (H) 12/06/2020 0225   HDL 22 (L) 12/06/2020 0225   CHOLHDL 5.9 12/06/2020 0225   VLDL 34 12/06/2020 0225   LDLCALC 73 12/06/2020 0225    Physical Exam:    Vital Signs: BP (!) 110/50   Pulse 73   Ht _0  (1.854 m)   Wt 206 lb 6.4 oz (93.6 kg)   BMI 27.23 kg/m     Wt Readings from Last 3 Encounters:  12/11/20 206 lb 6.4 oz (93.6 kg)  12/06/20 203 lb 4.2 oz (92.2 kg)  11/26/20 219 lb (99.3 kg)     General: 75 y.o. male in no acute distress. HEENT: Normocephalic and atraumatic. Sclera clear. EOMs intact. Neck: Supple. No carotid bruits. No JVD. Heart: RRR. Distinct S1 and S2. No murmurs, gallops, or rubs. Sternotomy and chest tube incision healing well. No active drainage. No signs of infection. Lungs: No increased work of breathing. Clear to ausculation bilaterally. No wheezes, rhonchi, or rales.  Abdomen: Soft, non-distended, and non-tender to palpation.  Extremities: No significant lower extremity edema. Incision from left radial artery harvest site healing well.  Skin: Warm and dry. Neuro: Alert and oriented x3. Slightly reduced grip strength of left hand and trouble with opposition following radial artery harvest.  Psych: Normal affect. Responds appropriately.   Assessment:    1. Coronary artery disease involving native coronary artery of native heart without angina pectoris   2. S/P CABG x 4   3. Post-op atrial flutter   4. HFrEF (heart failure with reduced ejection fraction) ([place])   5. Pericardial effusion   6. Bilateral  carotid artery stenosis   7. Primary hypertension   8. Hyperlipidemia, unspecified hyperlipidemia type   9. Pre-diabetes   10.  Fatigue, unspecified type   11. Possible depression     Plan:    CAD s/p Recent CABG x4 - S/p CABG x4 (LIMA to LAD, RIMA to PDA, and sequential radial artery to OM and Ramus Intermediate) on 11/20/2020. - Slow recovery but no recurrent angina.  - Continue aspirin, beta-blocker, and high-intensity statin. - Continue Isordil 56m three times daily for at least 30 days given radial artery graft.  Of note, patient does have some numbness of his middle finger and ring finger on the left hand as well a decreased grip strength and trouble with opposition. May of had some damage to median nerve during surgery. Recommended discussing with CT surgery. Scheduled to see them on 12/20/2020.  Post-Op Atrial Flutter - Readmitted after CABG for atrial flutter. - Maintaining sinus rhythm at this time.  - Continue Amiodarone 4076mtwice daily. Can transition to 2004maily starting on 12/14/2020. Plan is to continue Amiodarone for 1 month. - Continue Toprol-XL 100m50mily.  - Continue Eliquis 5mg 26mce daily.  Chronic Heart Failure with Mildly Reduced EF - Limited Echo on 12/05/2020 showed of 45-50% with severe hypokinesis of mid-apical anterior wall and anteroseptal wall and severe akinesis of the entire apical segment. Drop in EF felt to be secondary to atrial flutter.  - Appears euvolemic on exam.  - Continue Lasix 20mg 66my PRN as needed for shortness of breath, edema, and weight gain. - Continue Toprol-XL 100mg d14m. - Did not tolerate Lisinopril due to hypotension. - Will repeat BMET today to ensure renal function is stable.  Pericardial Effusion - Echo on 12/05/2020 showed moderate pericardial effusion with no signs of tamponade on RHC that same day. - Patient is scheduled to have repeat Echo later this week for further evaluation of this.  Bilateral Carotid Stenosis - Carotid ultrasound in 10/2020 showed 40-59% stenosis of bilateral ICAs. - Continue aspirin and high-intensity statin.  - Plan is  for repeat study in 10/2021.  Hypertension - Patient had episode of hypotension with systolic BP in the 70's a 16'Xle of days ago. BP improved after Lisinopril was stopped. BP stable today. - Continue Toprol-XL 100mg da28mand Isordil 10mg thr82mimes daily.  Hyperlipidemia - LDL 91 in 2010. - LDL goal <70 given CAD. - Continue Lipitor 80mg dail35m Will repeat lipid panel and LFTs.   Pre-Diabetes - Hemoglobin A1c 5.9 during recent admission. - Recommend lifestyle modifications.  General Fatigue/ Weakness - Patient reports continued weakness and fatigue. Advised trying to slowly increased physical activity as tolerated as I suspect he is getting deconditioned.  - Will check TSH and CBC today.  Possible Depression - Patient and wife think patient is depressed and patient states "I just kind of gave up." - Patient is scheduled to be see in PCP's office tomorrow. Recommended discussing this at that visit.    Disposition: Follow up in 2-3 weeks.   Medication Adjustments/Labs and Tests Ordered: Current medicines are reviewed at length with the patient today.  Concerns regarding medicines are outlined above.  Orders Placed This Encounter  Procedures  . TSH  . Comprehensive metabolic panel  . CBC  . Lipid panel  . EKG 12-Lead   Meds ordered this encounter  Medications  . amiodarone (PACERONE) 200 MG tablet    Sig: Take 1 tablet (200 mg total) by mouth daily. Take 2 tablets (400mg) twic35m  a day for 7 days then 1 table (219m) daily    Refill:  1    Patient Instructions  Medication Instructions:   DECREASE Amiodarone to 200 mg (1 tablet) daily on Friday 12/14/20 *If you need a refill on your cardiac medications before your next appointment, please call your pharmacy*  Lab Work: Your physician recommends that you return for lab work TODAY:   CBC  CMET  FLP  TSH  If you have labs (blood work) drawn today and your tests are completely normal, you will receive your  results only by: .Marland KitchenMyChart Message (if you have MyChart) OR . A paper copy in the mail If you have any lab test that is abnormal or we need to change your treatment, we will call you to review the results.  Testing/Procedures: NONE ordered at this time of appointment   Follow-Up: [facility] needs [facility] our [facility].  As part of our continuing mission to provide you with exceptional heart care, we have created designated Provider Care Teams.  These Care Teams include your primary Cardiologist (physician) and Advanced Practice Providers (APPs -  Physician Assistants and Nurse Practitioners) who all work together to provide you with the care you need, when you need it.  Your next appointment:   2 week(s)  The format for your next appointment:   In Person  Provider:   CSande Rives PA-C  Other Instructions      Signed, CDarreld Mclean PA-C  12/11/2020 10:06 PM    [facility]

## 2020-12-05 ENCOUNTER — Observation Stay (HOSPITAL_COMMUNITY)
Admission: EM | Admit: 2020-12-05 | Discharge: 2020-12-06 | Disposition: A | Discharge status: Home / Self Care | Payer: Medicare PPO | Attending: Cardiovascular Disease | Admitting: Cardiovascular Disease

## 2020-12-05 ENCOUNTER — Encounter (HOSPITAL_COMMUNITY): Payer: Self-pay

## 2020-12-05 ENCOUNTER — Other Ambulatory Visit: Payer: Self-pay

## 2020-12-05 ENCOUNTER — Emergency Department (HOSPITAL_COMMUNITY): Payer: Medicare PPO

## 2020-12-05 ENCOUNTER — Encounter (HOSPITAL_COMMUNITY)
Admission: EM | Disposition: A | Discharge status: Home / Self Care | Payer: Self-pay | Source: Home / Self Care | Attending: Emergency Medicine

## 2020-12-05 DIAGNOSIS — Z7982 Long term (current) use of aspirin: Secondary | ICD-10-CM | POA: Insufficient documentation

## 2020-12-05 DIAGNOSIS — I5031 Acute diastolic (congestive) heart failure: Secondary | ICD-10-CM | POA: Diagnosis not present

## 2020-12-05 DIAGNOSIS — I5021 Acute systolic (congestive) heart failure: Secondary | ICD-10-CM | POA: Diagnosis not present

## 2020-12-05 DIAGNOSIS — N179 Acute kidney failure, unspecified: Secondary | ICD-10-CM

## 2020-12-05 DIAGNOSIS — E785 Hyperlipidemia, unspecified: Secondary | ICD-10-CM

## 2020-12-05 DIAGNOSIS — M549 Dorsalgia, unspecified: Secondary | ICD-10-CM

## 2020-12-05 DIAGNOSIS — I2510 Atherosclerotic heart disease of native coronary artery without angina pectoris: Secondary | ICD-10-CM | POA: Insufficient documentation

## 2020-12-05 DIAGNOSIS — Z79899 Other long term (current) drug therapy: Secondary | ICD-10-CM | POA: Insufficient documentation

## 2020-12-05 DIAGNOSIS — I4892 Unspecified atrial flutter: Secondary | ICD-10-CM

## 2020-12-05 DIAGNOSIS — I313 Pericardial effusion (noninflammatory): Secondary | ICD-10-CM | POA: Insufficient documentation

## 2020-12-05 DIAGNOSIS — Z951 Presence of aortocoronary bypass graft: Secondary | ICD-10-CM

## 2020-12-05 DIAGNOSIS — I3139 Other pericardial effusion (noninflammatory): Secondary | ICD-10-CM

## 2020-12-05 DIAGNOSIS — I110 Hypertensive heart disease with heart failure: Secondary | ICD-10-CM | POA: Insufficient documentation

## 2020-12-05 DIAGNOSIS — E872 Acidosis, unspecified: Secondary | ICD-10-CM

## 2020-12-05 DIAGNOSIS — R0602 Shortness of breath: Secondary | ICD-10-CM

## 2020-12-05 DIAGNOSIS — Z20822 Contact with and (suspected) exposure to covid-19: Secondary | ICD-10-CM | POA: Insufficient documentation

## 2020-12-05 DIAGNOSIS — R55 Syncope and collapse: Secondary | ICD-10-CM

## 2020-12-05 HISTORY — PX: RIGHT HEART CATH: CATH118263

## 2020-12-05 LAB — PROTIME-INR
INR: 1.1 (ref 0.8–1.2)
Prothrombin Time: 13.5 seconds (ref 11.4–15.2)

## 2020-12-05 LAB — POCT I-STAT EG7
Acid-Base Excess: 1 mmol/L (ref 0.0–2.0)
Acid-Base Excess: 2 mmol/L (ref 0.0–2.0)
Acid-Base Excess: 3 mmol/L — ABNORMAL HIGH (ref 0.0–2.0)
Bicarbonate: 20.1 mmol/L (ref 20.0–28.0)
Bicarbonate: 22 mmol/L (ref 20.0–28.0)
Bicarbonate: 23.2 mmol/L (ref 20.0–28.0)
Calcium, Ion: 1.12 mmol/L — ABNORMAL LOW (ref 1.15–1.40)
Calcium, Ion: 1.19 mmol/L (ref 1.15–1.40)
Calcium, Ion: 1.21 mmol/L (ref 1.15–1.40)
HCT: 30 % — ABNORMAL LOW (ref 39.0–52.0)
HCT: 31 % — ABNORMAL LOW (ref 39.0–52.0)
HCT: 33 % — ABNORMAL LOW (ref 39.0–52.0)
Hemoglobin: 10.2 g/dL — ABNORMAL LOW (ref 13.0–17.0)
Hemoglobin: 10.5 g/dL — ABNORMAL LOW (ref 13.0–17.0)
Hemoglobin: 11.2 g/dL — ABNORMAL LOW (ref 13.0–17.0)
O2 Saturation: 60 %
O2 Saturation: 61 %
O2 Saturation: 99 %
Potassium: 3.5 mmol/L (ref 3.5–5.1)
Potassium: 3.8 mmol/L (ref 3.5–5.1)
Potassium: 3.9 mmol/L (ref 3.5–5.1)
Sodium: 140 mmol/L (ref 135–145)
Sodium: 140 mmol/L (ref 135–145)
Sodium: 142 mmol/L (ref 135–145)
TCO2: 21 mmol/L — ABNORMAL LOW (ref 22–32)
TCO2: 23 mmol/L (ref 22–32)
TCO2: 24 mmol/L (ref 22–32)
pCO2, Ven: 16.5 mmHg — CL (ref 44.0–60.0)
pCO2, Ven: 22.8 mmHg — ABNORMAL LOW (ref 44.0–60.0)
pCO2, Ven: 23.9 mmHg — ABNORMAL LOW (ref 44.0–60.0)
pH, Ven: 7.592 — ABNORMAL HIGH (ref 7.250–7.430)
pH, Ven: 7.594 — ABNORMAL HIGH (ref 7.250–7.430)
pH, Ven: 7.692 (ref 7.250–7.430)
pO2, Ven: 25 mmHg — CL (ref 32.0–45.0)
pO2, Ven: 26 mmHg — CL (ref 32.0–45.0)
pO2, Ven: 97 mmHg — ABNORMAL HIGH (ref 32.0–45.0)

## 2020-12-05 LAB — CBC WITH DIFFERENTIAL/PLATELET
Abs Immature Granulocytes: 0.13 10*3/uL — ABNORMAL HIGH (ref 0.00–0.07)
Basophils Absolute: 0.1 10*3/uL (ref 0.0–0.1)
Basophils Relative: 1 %
Eosinophils Absolute: 0.3 10*3/uL (ref 0.0–0.5)
Eosinophils Relative: 3 %
HCT: 32.4 % — ABNORMAL LOW (ref 39.0–52.0)
Hemoglobin: 10.1 g/dL — ABNORMAL LOW (ref 13.0–17.0)
Immature Granulocytes: 1 %
Lymphocytes Relative: 12 %
Lymphs Abs: 1.4 10*3/uL (ref 0.7–4.0)
MCH: 29.3 pg (ref 26.0–34.0)
MCHC: 31.2 g/dL (ref 30.0–36.0)
MCV: 93.9 fL (ref 80.0–100.0)
Monocytes Absolute: 0.9 10*3/uL (ref 0.1–1.0)
Monocytes Relative: 8 %
Neutro Abs: 9.1 10*3/uL — ABNORMAL HIGH (ref 1.7–7.7)
Neutrophils Relative %: 75 %
Platelets: 567 10*3/uL — ABNORMAL HIGH (ref 150–400)
RBC: 3.45 MIL/uL — ABNORMAL LOW (ref 4.22–5.81)
RDW: 13.6 % (ref 11.5–15.5)
WBC: 11.9 10*3/uL — ABNORMAL HIGH (ref 4.0–10.5)
nRBC: 0 % (ref 0.0–0.2)

## 2020-12-05 LAB — RESP PANEL BY RT-PCR (FLU A&B, COVID) ARPGX2
Influenza A by PCR: NEGATIVE
Influenza B by PCR: NEGATIVE
SARS Coronavirus 2 by RT PCR: NEGATIVE

## 2020-12-05 LAB — ECHOCARDIOGRAM LIMITED
Calc EF: 46.3 %
Single Plane A2C EF: 54.1 %
Single Plane A4C EF: 40.9 %

## 2020-12-05 LAB — TROPONIN I (HIGH SENSITIVITY)
Troponin I (High Sensitivity): 313 ng/L (ref <18)
Troponin I (High Sensitivity): 309 ng/L (ref <18)

## 2020-12-05 LAB — COMPREHENSIVE METABOLIC PANEL
ALT: 33 U/L (ref 0–44)
AST: 28 U/L (ref 15–41)
Albumin: 2.9 g/dL — ABNORMAL LOW (ref 3.5–5.0)
Alkaline Phosphatase: 150 U/L — ABNORMAL HIGH (ref 38–126)
Anion gap: 9 (ref 5–15)
BUN: 24 mg/dL — ABNORMAL HIGH (ref 8–23)
CO2: 23 mmol/L (ref 22–32)
Calcium: 9.3 mg/dL (ref 8.9–10.3)
Chloride: 105 mmol/L (ref 98–111)
Creatinine, Ser: 1.34 mg/dL — ABNORMAL HIGH (ref 0.61–1.24)
GFR, Estimated: 56 mL/min — ABNORMAL LOW (ref >60)
Glucose, Bld: 119 mg/dL — ABNORMAL HIGH (ref 70–99)
Potassium: 4.1 mmol/L (ref 3.5–5.1)
Sodium: 137 mmol/L (ref 135–145)
Total Bilirubin: 0.9 mg/dL (ref 0.3–1.2)
Total Protein: 7 g/dL (ref 6.5–8.1)

## 2020-12-05 LAB — LIPASE, BLOOD: Lipase: 41 U/L (ref 11–51)

## 2020-12-05 LAB — LACTIC ACID, PLASMA
Lactic Acid, Venous: 2.1 mmol/L (ref 0.5–1.9)
Lactic Acid, Venous: 1.2 mmol/L (ref 0.5–1.9)
Lactic Acid, Venous: 1.7 mmol/L (ref 0.5–1.9)

## 2020-12-05 LAB — BRAIN NATRIURETIC PEPTIDE: B Natriuretic Peptide: 615.2 pg/mL — ABNORMAL HIGH (ref 0.0–100.0)

## 2020-12-05 LAB — MAGNESIUM: Magnesium: 1.8 mg/dL (ref 1.7–2.4)

## 2020-12-05 SURGERY — RIGHT HEART CATH
Anesthesia: LOCAL

## 2020-12-05 MED ORDER — HEPARIN (PORCINE) IN NACL 1000-0.9 UT/500ML-% IV SOLN
INTRAVENOUS | Status: AC
  Filled 2020-12-05: qty 500

## 2020-12-05 MED ORDER — HEPARIN BOLUS VIA INFUSION
5000.0000 [IU] | Freq: Once | INTRAVENOUS | Status: DC
  Filled 2020-12-05: qty 5000

## 2020-12-05 MED ORDER — APIXABAN 5 MG PO TABS
10.0000 mg | ORAL_TABLET | Freq: Once | ORAL | Status: AC
  Administered 2020-12-06: 10 mg via ORAL
  Filled 2020-12-05: qty 2

## 2020-12-05 MED ORDER — IOHEXOL 350 MG/ML SOLN
100.0000 mL | Freq: Once | INTRAVENOUS | Status: AC | PRN
  Administered 2020-12-05: 100 mL via INTRAVENOUS

## 2020-12-05 MED ORDER — SODIUM CHLORIDE 0.9 % IV SOLN: INTRAVENOUS | Status: DC

## 2020-12-05 MED ORDER — ACETAMINOPHEN 325 MG PO TABS: 650.0000 mg | ORAL_TABLET | ORAL | Status: DC | PRN

## 2020-12-05 MED ORDER — ONDANSETRON HCL 4 MG/2ML IJ SOLN: 4.0000 mg | Freq: Four times a day (QID) | INTRAMUSCULAR | Status: DC | PRN

## 2020-12-05 MED ORDER — HEPARIN SODIUM (PORCINE) 1000 UNIT/ML IJ SOLN
INTRAMUSCULAR | Status: DC | PRN
  Administered 2020-12-05: 3000 [IU] via INTRAVENOUS

## 2020-12-05 MED ORDER — FENTANYL CITRATE (PF) 100 MCG/2ML IJ SOLN
50.0000 ug | Freq: Once | INTRAMUSCULAR | Status: AC
Start: 2020-12-05 — End: 2020-12-05
  Administered 2020-12-05: 50 ug via INTRAVENOUS
  Filled 2020-12-05: qty 2

## 2020-12-05 MED ORDER — VERAPAMIL HCL 2.5 MG/ML IV SOLN
INTRAVENOUS | Status: AC
  Filled 2020-12-05: qty 2

## 2020-12-05 MED ORDER — SODIUM CHLORIDE 0.9% FLUSH
3.0000 mL | Freq: Two times a day (BID) | INTRAVENOUS | Status: DC
  Administered 2020-12-05 – 2020-12-06 (×3): 3 mL via INTRAVENOUS

## 2020-12-05 MED ORDER — ASPIRIN 81 MG PO CHEW
81.0000 mg | CHEWABLE_TABLET | ORAL | Status: AC
  Administered 2020-12-05: 81 mg via ORAL
  Filled 2020-12-05: qty 1

## 2020-12-05 MED ORDER — SODIUM CHLORIDE 0.9 % IV SOLN: 250.0000 mL | INTRAVENOUS | Status: DC | PRN

## 2020-12-05 MED ORDER — FENTANYL CITRATE (PF) 100 MCG/2ML IJ SOLN
INTRAMUSCULAR | Status: AC
  Filled 2020-12-05: qty 2

## 2020-12-05 MED ORDER — METOPROLOL TARTRATE 50 MG PO TABS
50.0000 mg | ORAL_TABLET | Freq: Two times a day (BID) | ORAL | Status: DC
  Administered 2020-12-05 – 2020-12-06 (×3): 50 mg via ORAL
  Filled 2020-12-05 (×2): qty 1
  Filled 2020-12-05: qty 2

## 2020-12-05 MED ORDER — ASPIRIN EC 81 MG PO TBEC
81.0000 mg | DELAYED_RELEASE_TABLET | Freq: Every day | ORAL | Status: DC
  Administered 2020-12-06: 81 mg via ORAL
  Filled 2020-12-05: qty 1

## 2020-12-05 MED ORDER — ATORVASTATIN CALCIUM 80 MG PO TABS
80.0000 mg | ORAL_TABLET | Freq: Every day | ORAL | Status: DC
  Administered 2020-12-05 – 2020-12-06 (×2): 80 mg via ORAL
  Filled 2020-12-05: qty 1
  Filled 2020-12-05 (×2): qty 2

## 2020-12-05 MED ORDER — SODIUM CHLORIDE 0.9 % IV SOLN: INTRAVENOUS | Status: AC

## 2020-12-05 MED ORDER — NITROGLYCERIN 0.4 MG SL SUBL: 0.4000 mg | SUBLINGUAL_TABLET | SUBLINGUAL | Status: DC | PRN

## 2020-12-05 MED ORDER — APIXABAN 5 MG PO TABS: 5.0000 mg | ORAL_TABLET | Freq: Two times a day (BID) | ORAL | Status: DC

## 2020-12-05 MED ORDER — FENTANYL CITRATE (PF) 100 MCG/2ML IJ SOLN
INTRAMUSCULAR | Status: DC | PRN
  Administered 2020-12-05 (×2): 25 ug via INTRAVENOUS

## 2020-12-05 MED ORDER — LIDOCAINE HCL (PF) 1 % IJ SOLN
INTRAMUSCULAR | Status: AC
  Filled 2020-12-05: qty 30

## 2020-12-05 MED ORDER — HEPARIN (PORCINE) 25000 UT/250ML-% IV SOLN: 1500.0000 [IU]/h | INTRAVENOUS | Status: DC

## 2020-12-05 MED ORDER — LIDOCAINE HCL (PF) 1 % IJ SOLN
INTRAMUSCULAR | Status: DC | PRN
  Administered 2020-12-05 (×2): 2 mL

## 2020-12-05 MED ORDER — VERAPAMIL HCL 2.5 MG/ML IV SOLN
INTRAVENOUS | Status: DC | PRN
  Administered 2020-12-05: 10 mL via INTRA_ARTERIAL

## 2020-12-05 MED ORDER — SODIUM CHLORIDE 0.9% FLUSH
3.0000 mL | Freq: Two times a day (BID) | INTRAVENOUS | Status: DC
  Administered 2020-12-05 – 2020-12-06 (×2): 3 mL via INTRAVENOUS

## 2020-12-05 MED ORDER — ENOXAPARIN SODIUM 40 MG/0.4ML ~~LOC~~ SOLN: 40.0000 mg | SUBCUTANEOUS | Status: DC

## 2020-12-05 MED ORDER — LABETALOL HCL 5 MG/ML IV SOLN: 10.0000 mg | INTRAVENOUS | Status: AC | PRN

## 2020-12-05 MED ORDER — APIXABAN 5 MG PO TABS: 10.0000 mg | ORAL_TABLET | Freq: Once | ORAL | Status: DC

## 2020-12-05 MED ORDER — SODIUM CHLORIDE 0.9% FLUSH: 3.0000 mL | INTRAVENOUS | Status: DC | PRN

## 2020-12-05 MED ORDER — SODIUM CHLORIDE 0.9 % IV BOLUS
500.0000 mL | Freq: Once | INTRAVENOUS | Status: AC
  Administered 2020-12-05: 500 mL via INTRAVENOUS

## 2020-12-05 MED ORDER — FUROSEMIDE 10 MG/ML IJ SOLN
40.0000 mg | Freq: Two times a day (BID) | INTRAMUSCULAR | Status: DC
  Administered 2020-12-05 (×2): 40 mg via INTRAVENOUS
  Filled 2020-12-05 (×2): qty 4

## 2020-12-05 MED ORDER — HYDRALAZINE HCL 20 MG/ML IJ SOLN: 10.0000 mg | INTRAMUSCULAR | Status: AC | PRN

## 2020-12-05 MED ORDER — ASPIRIN EC 81 MG PO TBEC: 81.0000 mg | DELAYED_RELEASE_TABLET | Freq: Every day | ORAL | Status: DC

## 2020-12-05 MED ORDER — HEPARIN (PORCINE) IN NACL 1000-0.9 UT/500ML-% IV SOLN
INTRAVENOUS | Status: DC | PRN
  Administered 2020-12-05 (×3): 500 mL

## 2020-12-05 MED ORDER — HEPARIN SODIUM (PORCINE) 1000 UNIT/ML IJ SOLN
INTRAMUSCULAR | Status: AC
  Filled 2020-12-05: qty 1

## 2020-12-05 SURGICAL SUPPLY — 10 items
CATH INFINITI 5FR ANG PIGTAIL (CATHETERS) ×1 IMPLANT
CATH INFINITI JR4 5F (CATHETERS) ×1 IMPLANT
CATH SWAN GANZ 7F STRAIGHT (CATHETERS) ×1 IMPLANT
DEVICE RAD COMP TR BAND LRG (VASCULAR PRODUCTS) ×1 IMPLANT
GLIDESHEATH SLEND SS 6F .021 (SHEATH) ×1 IMPLANT
GLIDESHEATH SLENDER 7FR .021G (SHEATH) ×1 IMPLANT
GUIDEWIRE INQWIRE 1.5J.035X260 (WIRE) IMPLANT
INQWIRE 1.5J .035X260CM (WIRE) ×2
PACK CARDIAC CATHETERIZATION (CUSTOM PROCEDURE TRAY) ×2 IMPLANT
TRANSDUCER W/STOPCOCK (MISCELLANEOUS) ×2 IMPLANT

## 2020-12-05 NOTE — H&P (Signed)
Cardiology History and Physical:   Patient ID: Martin [name]; [id number]; June 07, 1946   Admit date: 12/05/2020 Date of Consult: 12/05/2020  Primary Care Provider: [name], MD Primary Cardiologist: Dr. Rollene Rotunda, MD  Patient Profile:   Martin [name] is a 75 y.o. male with a hx of CAD s/p PCI to RCA with acute in-stent thrombosis with subsequent DES times 04/2007 with recent CABG 11/21/20 with an echo in on him LIMA to LAD, RIMA to PDA and sequential radial artery to OM and ramus intermediate, UC, GERD, hx of PE and HLD who is being seen today for the evaluation of acute onset of SOB at the request of Dr. Rush Landmark.  History of Present Illness:   Martin [name] is a 75 year old male with a history stated above who presented to [facility] 12/05/2020 with acute onset of SOB while participating in [facility] PT yesterday afternoon. Pt reports that he was in his usual state of health prior to the episode when he was participating with [facility] PT by walking in his yard when he became SOB with associated diaphoresis however no chest pain. He reports that he came into the house and sat down. At that time, EMS was called given his recent CABG and he was transported to the ED for further evaluation. On their arrival, he was found to be hypotensive with an SBP in the 80s and was diaphoretic. He was given IV fluids with improvement. He states that he has not had a prior episode like this since hospital discharge. He denies palpitations, LE edema, orthopnea, dizziness or syncope. He has been walking daily around his yard at least 3-5 times without any symptoms. He has been compliant with his medications with no issues.    On ED arrival, EKG showed atrial flutter with RVR. 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 Also with a small new focus of nonspecific subpleural ground-glass opacity in none   is the left upper lobe, perhaps mild pulmonary contusion from chest tube placement. No aortic dissection or aneurysm and moderate to large gastric hiatal hernia. BNP noted to be 615. HST at 313. CXR with no pleural effusion, no pneumothorax. Creatinine at 1.34 with a baseline at 0.9-1.0.   Martin [name] has a history of complex CAD with PCI to RCA in 2008 with acute in-stent thrombosis during the same hospital stay with subsequent DES x3 placed to the RCA.  He had an exercise stress test in 2015 and again in 2021 both without evidence of ischemia.  He was seen by Dr. Antoine Poche 11/02/2020 with anginal symptoms at which time he was referred for Lexiscan stress test.  This was performed on 11/08/2020 which was severely abnormal therefore he was referred for cardiac catheterization for further investigation.  LHC performed 11/20/2020 that showed severe progressive multivessel CAD with recommendations for CABG revascularization.  He underwent CABG x4 with LIMA to LAD, RIMA to PDA and sequential radial artery to OM and ramus intermediate on 11/21/2020.  He tolerated procedure well without difficulty.  He was ultimately discharged 11/26/2020.  Past Medical History:  Diagnosis Date  . Benign prostatic hypertrophy   . Coronary artery disease    cath 04/2007 with 90-95% RCA stenosis. Had DES placed.  Had an acute stent thrombosis in the  hospital that day and subsequently had 3 DES placed in the RCA. The LAD had 45% stenosis, in the circ had 35% stenosis. The EF was well preserved.  . Dyslipidemia   . GERD (gastroesophageal reflux disease)   . Ulcerative colitis     Past Surgical History:  Procedure Laterality Date  . CORONARY ARTERY BYPASS GRAFT N/A 11/21/2020   Procedure: CORONARY ARTERY BYPASS GRAFTING (CABG) TIMES FOUR ON PUMP USING BILATERAL INTERNAL  MAMMARY ARTERIES AND LEFT RADIAL ARTERY;  Surgeon: [name], MD;  Location: [facility] OR;  Service: Open Heart Surgery;  Laterality: N/A;  BIMA  . INGUINAL HERNIA REPAIR  1999  . LEFT HEART CATH AND CORONARY ANGIOGRAPHY N/A 11/20/2020   Procedure: LEFT HEART CATH AND CORONARY ANGIOGRAPHY;  Surgeon: Lennette Bihari, MD;  Location: [facility] INVASIVE CV LAB;  Service: Cardiovascular;  Laterality: N/A;  . RADIAL ARTERY HARVEST Left 11/21/2020   Procedure: RADIAL ARTERY HARVEST;  Surgeon: [name], MD;  Location: [facility] OR;  Service: Open Heart Surgery;  Laterality: Left;  . ROTATOR CUFF REPAIR Left   . TEE WITHOUT CARDIOVERSION N/A 11/21/2020   Procedure: TRANSESOPHAGEAL ECHOCARDIOGRAM (TEE);  Surgeon: [name], MD;  Location: [facility] OR;  Service: Open Heart Surgery;  Laterality: N/A;     Prior to Admission medications   Medication Sig Start Date End Date Taking? Authorizing Provider  acetaminophen (TYLENOL) 325 MG tablet Take 2 tablets (650 mg total) by mouth every 4 (four) hours as needed for headache or mild pain. 11/26/20   Barrett, Erin R, PA-C  ASACOL HD 800 MG TBEC Take 800 mg by mouth 3 (three) times daily. 07/09/15   [provider]  aspirin 81 MG tablet Take 81 mg by mouth daily.    [provider]  atorvastatin (LIPITOR) 80 MG tablet Take 1 tablet (80 mg total) by mouth daily. 11/26/20   Barrett, Erin R, PA-C  colchicine 0.6 MG tablet Take 0.5 tablets (0.3 mg total) by mouth 2 (two) times daily. You may stop this medication if diarrhea develops 11/26/20   Barrett, Erin R, PA-C  furosemide (LASIX) 40 MG tablet Take 1 tablet (40 mg total) by mouth daily. 11/26/20   Barrett, Erin R, PA-C  isosorbide dinitrate (ISORDIL) 10 MG tablet Take 1 tablet (10 mg total) by mouth 3 (three) times daily. 11/26/20   Barrett, Erin R, PA-C  loratadine (CLARITIN) 10 MG tablet Take 10 mg by mouth daily.    [provider]  metoprolol tartrate (LOPRESSOR) 50 MG tablet Take 1 tablet (50  mg total) by mouth 2 (two) times daily. 11/26/20   Barrett, Erin R, PA-C  nitroGLYCERIN (NITROSTAT) 0.4 MG SL tablet Place 0.4 mg under the tongue every 5 (five) minutes as needed for chest pain.    [provider]  omeprazole (PRILOSEC OTC) 20 MG tablet Take 20 mg by mouth daily.    [provider]  potassium chloride SA (KLOR-CON) 20 MEQ tablet Take 1 tablet (20 mEq total) by mouth daily. 11/26/20   Barrett, Erin R, PA-C  tamsulosin (FLOMAX) 0.4 MG CAPS capsule Take 0.4 mg by mouth daily.    [provider]  traMADol (ULTRAM) 50 MG tablet Take 1-2 tablets (50-100 mg total) by mouth every 4 (four) hours as needed for moderate pain. 11/26/20   Barrett, Erin R, PA-C  traZODone (DESYREL) 50 MG tablet Take 50 mg by mouth at bedtime as needed for sleep. 07/25/20   [provider]   Allergies:  Allergies  Allergen Reactions  . Morphine     Hot, sweats, flushed    Social History:   Social History   Socioeconomic History  . Marital status: Married    Spouse name: Not on file  . Number of children: Not on file  . Years of education: Not on file  . Highest education level: Not on file  Occupational History  . Not on file  Tobacco Use  . Smoking status: Never Smoker  . Smokeless tobacco: Never Used  Substance and Sexual Activity  . Alcohol use: No  . Drug use: Not on file  . Sexual activity: Not Currently  Other Topics Concern  . Not on file  Social History Narrative  . Not on file   Social Determinants of Health   Financial Resource Strain: Not on file  Food Insecurity: Not on file  Transportation Needs: Not on file  Physical Activity: Not on file  Stress: Not on file  Social Connections: Not on file  Intimate Partner Violence: Not on file    Family History:   Family History  Problem Relation Age of Onset  . Heart attack Mother   . Heart attack Father   . Heart attack Brother    Family Status:  Family Status  Relation Name Status  .  Mother  Deceased at age 20       MI  . Father  Deceased at age 44       MI  . Brother  Deceased at age 38       Rheumatic Heart Disease, MI    ROS:  Please see the history of present illness.  All other ROS reviewed and negative.     Physical Exam/Data:   Vitals:   12/05/20 0830 12/05/20 0845 12/05/20 0915 12/05/20 1032  BP: 108/65 (!) 126/56 (!) 126/56 119/72  Pulse: 80 69 82 73  Resp: 14 (!) 26 (!) 21 16  Temp:      TempSrc:      SpO2: 98% 95% 98% 97%   No intake or output data in the 24 hours ending 12/05/20 1037 There were no vitals filed for this visit. There is no height or weight on file to calculate BMI.   General: Well developed, well nourished, NAD Neck: Negative for carotid bruits. No JVD Lungs:Clear to ausculation bilaterally. No wheezes, rales, or rhonchi. Breathing is unlabored. Cardiovascular: Regularly irregular. No murmurs Abdomen: Soft, non-tender, non-distended. No obvious abdominal masses. Extremities: No edema. Radial pulses 2+ bilaterally Neuro: Alert and oriented. No focal deficits. No facial asymmetry. MAE spontaneously. Psych: Responds to questions appropriately with normal affect.     EKG:  The EKG was personally reviewed and demonstrates: 12/05/20 Atrial flutter with HR 82bpm and no ischemic changes   Telemetry:  Telemetry was personally reviewed and demonstrates: 12/05/20 AF with HR in the 110-120 range   Relevant CV Studies:  LHC 11/20/20:    Prox RCA lesion is 80% stenosed.  Mid RCA-1 lesion is 70% stenosed.  Mid RCA-2 lesion is 90% stenosed.  RPDA lesion is 60% stenosed.  1st Diag lesion is 99% stenosed.  2nd Diag lesion is 90% stenosed.  Ost LAD to Prox LAD lesion is 70% stenosed.  Prox LAD to Mid LAD lesion is 70% stenosed.  Ost Cx to Prox Cx lesion is 50% stenosed.  Prox Cx to Mid Cx lesion is 50% stenosed.  Mid LAD lesion is 95% stenosed.  Mid LAD to Dist LAD lesion is 70% stenosed.  Severe progressive multivessel  CAD with 70% proximal, 70%, 95% and 70% mid LAD stenosis with a 99% stenosis in a very proximal diagonal vessel and 90% ostial stenosis in the second diagonal vessel; ostial and proximal 40 to 50% circumflex stenoses; and in-stent restenosis of 80% followed by 70% in the tandem sequential stents the RCA with new 90% stenosis beyond the stented segment and evidence for 50 to 60% ostial stenosis of a large PDA vessel.  Preserved global LV contractility with a subtle area of mild mid anterolateral hypocontractility.  EF estimate 55%; LVEDP 25 mm.  RECOMMENDATION: Surgical consultation for CABG revascularization surgery.   Diagnostic Dominance: Right     Echo 11/20/20:  1. Left ventricular ejection fraction, by estimation, is 55 to 60%. The  left ventricle has normal function. The left ventricle has no regional  wall motion abnormalities. There is mild concentric left ventricular  hypertrophy. Left ventricular diastolic  parameters are consistent with Grade I diastolic dysfunction (impaired  relaxation).  2. Right ventricular systolic function is normal. The right ventricular  size is normal. Tricuspid regurgitation signal is inadequate for assessing  PA pressure.  3. The mitral valve is grossly normal. Trivial mitral valve  regurgitation. No evidence of mitral stenosis.  4. The aortic valve is tricuspid. There is mild calcification of the  aortic valve. Aortic valve regurgitation is trivial. No aortic stenosis is  present.   Laboratory Data:  Chemistry Recent Labs  Lab 12/05/20 0300  NA 137  K 4.1  CL 105  CO2 23  GLUCOSE 119*  BUN 24*  CREATININE 1.34*  CALCIUM 9.3  GFRNONAA 56*  ANIONGAP 9    Total Protein  Date Value Ref Range Status  12/05/2020 7.0 6.5 - 8.1 g/dL Final   Albumin  Date Value Ref Range Status  12/05/2020 2.9 (L) 3.5 - 5.0 g/dL Final   AST  Date Value Ref Range Status  12/05/2020 28 15 - 41 U/L Final   ALT  Date Value Ref Range Status   12/05/2020 33 0 - 44 U/L Final   Alkaline Phosphatase  Date Value Ref Range Status  12/05/2020 150 (H) 38 - 126 U/L Final   Total Bilirubin  Date Value Ref Range Status  12/05/2020 0.9 0.3 - 1.2 mg/dL Final   Hematology Recent Labs  Lab 12/05/20 0300  WBC 11.9*  RBC 3.45*  HGB 10.1*  HCT 32.4*  MCV 93.9  MCH 29.3  MCHC 31.2  RDW 13.6  PLT 567*   Cardiac EnzymesNo results for input(s): TROPONINI in the last 168 hours. No results for input(s): TROPIPOC in the last 168 hours.  BNP Recent Labs  Lab 12/05/20 0301  BNP 615.2*    DDimer No results for input(s): DDIMER in the last 168 hours. TSH:  Lab Results  Component Value Date   TSH 1.46 08/14/2009   Lipids: Lab Results  Component Value Date   CHOL 161 08/14/2009   HDL 38.90 (L) 08/14/2009   LDLCALC 91 08/14/2009   TRIG 158.0 (H) 08/14/2009   CHOLHDL 4 08/14/2009   HgbA1c: Lab Results  Component Value Date   HGBA1C 5.9 (H) 11/21/2020    Radiology/Studies:  DG Chest Portable 1 View  Result Date: 12/05/2020 CLINICAL DATA:  Shortness of breath and weakness starting tonight. Open-heart surgery 2 weeks ago. EXAM: PORTABLE CHEST 1 VIEW COMPARISON:  11/25/2020 FINDINGS: Postoperative changes in the mediastinum. Heart size and pulmonary vascularity are normal. Lungs are clear. No pleural effusions. No pneumothorax. Mediastinal contours appear  intact. Calcification of the aorta. IMPRESSION: No active disease. Electronically Signed   By: Burman NievesWilliam  Stevens M.D.   On: 12/05/2020 03:23   CT Angio Chest/Abd/Pel for Dissection W and/or Wo Contrast  Result Date: 12/05/2020 CLINICAL DATA:  75 year old male with chest and back pain. Abdominal pain radiating to the back, diaphoresis. Recent CABG, 2 weeks ago. EXAM: CT ANGIOGRAPHY CHEST, ABDOMEN AND PELVIS TECHNIQUE: Non-contrast CT of the chest was initially obtained. Multidetector CT imaging through the chest, abdomen and pelvis was performed using the standard protocol during  bolus administration of intravenous contrast. Multiplanar reconstructed images and MIPs were obtained and reviewed to evaluate the vascular anatomy. CONTRAST:  100mL OMNIPAQUE IOHEXOL 350 MG/ML SOLN COMPARISON:  Chest CT without contrast 11/20/2020. CT Abdomen and Pelvis without and with contrast 05/17/2019. FINDINGS: CTA CHEST FINDINGS Cardiovascular: Interval sequelae of CABG. Extensive atherosclerosis of the thoracic aorta. No aortic aneurysm or dissection. Pulmonary arteries also well enhanced. Central and hilar pulmonary arteries appear to be patent. Moderate new pericardial effusion with simple fluid density. 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 Mediastinum/Nodes: Small volume postoperative mediastinal hematoma. No lymphadenopathy. Moderate to large gastric hiatal hernia peers stable and size and configuration. Incidentally there is a small volume of hyperdense material within the herniated stomach, present on the noncontrast portion. Lungs/Pleura: Stable to mildly larger lung volumes from last month. Major airways remain patent. Chronic atelectasis associated with the gastric hiatal hernia. No pneumothorax or pleural effusion. Small nonspecific focus of subpleural ground-glass opacity in the lateral left upper lobe on series 5 images 31 through 35. Elsewhere lungs essentially clear. Musculoskeletal: New median sternotomy. No acute osseous abnormality identified. Review of the MIP images confirms the above findings. CTA ABDOMEN AND PELVIS FINDINGS VASCULAR Extensive Aortoiliac calcified atherosclerosis. Negative for abdominal aortic aneurysm or dissection. Major arterial structures remain patent despite atherosclerosis. Less pronounced proximal femoral artery atherosclerosis. Review of the MIP images confirms the above findings. NON-VASCULAR Hepatobiliary: Sludge layering within the gallbladder. No pericholecystic inflammation. Negative liver. Pancreas: Negative. Spleen:  Negative. Adrenals/Urinary Tract: Negative. Symmetric enhancement of nonobstructed kidneys. Decompressed ureters and urinary bladder. Stomach/Bowel: No dilated large or small bowel. Redundant large bowel flexures. Cecum is on a lax mesentery. No large bowel inflammation. Normal appendix arising on series 7, image 241. Negative terminal ileum. Chronic gastric hernia. Intra-abdominal stomach and duodenum unremarkable. No free air, free fluid, mesenteric inflammation. Lymphatic: No lymphadenopathy. Reproductive: Sequelae of prostate brachytherapy. Other: No pelvic free fluid. Musculoskeletal: Advanced chronic lumbar spine degeneration. No acute osseous abnormality identified. Review of the MIP images confirms the above findings. IMPRESSION: 1. Sequelae of recent CABG with a new small to moderate pericardial effusion, but low-density pericardial fluid favors transudate. 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 Recommend correlation with echocardiogram. 2. Small new focus of nonspecific subpleural ground-glass opacity in the left upper lobe, perhaps mild pulmonary contusion from chest tube placement. Lungs otherwise stable from last month and negative. 3. Negative for aortic dissection or aneurysm. Extensive Aortic Atherosclerosis (ICD10-I70.0). 4. No other acute or inflammatory process identified in the chest, abdomen, or pelvis. 5. Moderate to large gastric hiatal hernia. Electronically Signed   By: Odessa FlemingH  Hall M.D.   On: 12/05/2020 04:17   ECHOCARDIOGRAM LIMITED  Result Date: 12/05/2020    ECHOCARDIOGRAM LIMITED REPORT   Patient Name:   Martin [name] Date of Exam: 12/05/2020 Medical Rec #:  [id number]      Height:  73.0 in Accession #:    [id number]     Weight:       219.0 lb Date of Birth:  08-27-1946      BSA:          2.236 m Patient Age:    74 years       BP:           120/66 mmHg Patient Gender: M              HR:           76 bpm. Exam Location:  Inpatient Procedure: Limited  Echo, Limited Color Doppler and Cardiac Doppler STAT ECHO Indications:    Pericardial effusion I31.3  History:        Patient has prior history of Echocardiogram examinations, most                 recent 11/20/2020. CAD and Previous Myocardial Infarction, Prior                 CABG; Risk Factors:Dyslipidemia. GERD.  Sonographer:    Leta Jungling RDCS Referring Phys: [phone] LINDSAY B ROBERTS IMPRESSIONS  1. Limited for pericardial effusion  2. Left ventricular ejection fraction, by estimation, is 45 to 50%. The left ventricle has mildly decreased function. There is severe hypokinesis of the left ventricular, mid-apical anterior wall and anteroseptal wall. There is severe akinesis of the left ventricular, entire apical segment.  3. Right ventricular systolic function was not well visualized.  4. Moderate pericardial effusion. The pericardial effusion is circumferential. There is no evidence of cardiac tamponade.  5. The mitral valve was not assessed.  6. The aortic valve was not assessed.  7. The inferior vena cava is normal in size with greater than 50% respiratory variability, suggesting right atrial pressure of 3 mmHg. Comparison(s): Changes from prior study are noted. 11/20/2020: LVEF 55-60%, trivial pericardial effusion. Conclusion(s)/Recommendation(s): Critical findings reported to Geoffry Paradise, NP and acknowledged at 8:41 am on 12/05/20. FINDINGS  Left Ventricle: Left ventricular ejection fraction, by estimation, is 45 to 50%. The left ventricle has mildly decreased function. Severe hypokinesis of the left ventricular, mid-apical anterior wall and anteroseptal wall. Severe akinesis of the left ventricular, entire apical segment. Right Ventricle: Right ventricular systolic function was not well visualized. Left Atrium: Left atrial size was not assessed. Right Atrium: Right atrial size was not assessed. Pericardium: A moderately sized pericardial effusion is present. The pericardial effusion is circumferential.  There is excessive respiratory variation in the tricuspid valve spectral Doppler velocities. There is no evidence of cardiac tamponade. Mitral Valve: The mitral valve was not assessed. Tricuspid Valve: The tricuspid valve is not assessed. Aortic Valve: The aortic valve was not assessed. Pulmonic Valve: The pulmonic valve was not assessed. Aorta: Aortic root could not be assessed. Venous: The inferior vena cava is normal in size with greater than 50% respiratory variability, suggesting right atrial pressure of 3 mmHg.  LV Volumes (MOD) LV vol d, MOD A2C: 108.0 ml LV vol d, MOD A4C: 126.0 ml LV vol s, MOD A2C: 49.6 ml LV vol s, MOD A4C: 74.5 ml LV SV MOD A2C:     58.4 ml LV SV MOD A4C:     126.0 ml LV SV MOD BP:      54.2 ml AORTIC VALVE LVOT Vmax:   83.90 cm/s LVOT Vmean:  66.000 cm/s LVOT VTI:    0.154 m  SHUNTS Systemic VTI: 0.15 m Zoila Shutter MD Electronically signed by Zoila Shutter  MD Signature Date/Time: 12/05/2020/10:02:56 AM    Final    Assessment and Plan:   1. Acute onset of SOB with moderate pericardial effusion: -Pt presented to [facility] 12/05/2020 with abrupt onset of SOB while walking with home health PT. He states he had been walking and ambulating ok with no issue prior to yesterday. On EMS arrival he was hypotensive and was given IVF with improvement. Given recent CABG and hx of bilateral PE, there was concern for dissection or recurrent pulmonary embolism. 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 -Follow echocardiogram with pending results.  -BNP noted to be 615. HST at 313.  -CXR with no pleural effusion, no pneumothorax.- -Creatinine at 1.34 with a baseline at 0.9-1.0 -Plan to diurese with IV Lasix for moderate volume overload. Unclear if his symptoms are stemming from pericardial effusion versus atrial flutter   2. New onset atrial flutter with moderate HF symptoms: -Pt  presented with acute onset of SOB and diaphoresis while participating in [facility] PT found to be in new onset atrial flutter with RVR on ED presentation.  -Last echo from 11/20/20 with LVEF at 55-60%, mild LVH, G1DD -Start anticoagulation with IV heparin for now until echocardiogram results regarding pericardial effusion  -I suspect that acute onset of symptoms likely related to atrial flutter however given pericardial effusion, could very well be a combination of both.  -Restart PTA metoprolol>>plan for TEE DCCV tomorrow  -CHA2DS2VASc score= 5 (age, CHF, thromboembolism, CABG)  2.  Complex CAD s/p PCI to RCA 2008>>recent CABG x4 11/21/20: -Pt had PCI to RCA with thromboembolism during the same hospitalization with PCI to RCA x3 in 2008. He was recently seen by Dr. Antoine Poche with worsening fatigue and subsequent abnormal stress test. LHC showed multi vessel CAD with revascularization with CABG recommended  -CABG x4 with LIMA to LAD, RIMA to PDA and sequential radial artery to OM and ramus intermediate on 11/21/2020. -Sternal sites without oozing or redness -No chest pain  -Continue ASA, atorvastatin, metoprolol   3. HLD: -Last LDL, 91 in 08/14/2009 -Repeat lipid panel  -Continue high intensity statin   4. GERD/Ulcerative colitis: -No concern this admission    For questions or updates, please contact [facility] Please consult [email or web address] for contact info under Cardiology/STEMI.   SignedGeorgie Chard NP-[facility] Pager: [phone] 12/05/2020 10:37 AM

## 2020-12-05 NOTE — ED Triage Notes (Signed)
Pt comes from home via [facility] for SOB and weakness that started tonight, hx of open heart surgery two weeks ago. Upon EMS arrival pt was hypotensive at 88 systolic, diaphoretic, given 700cc fluid.

## 2020-12-05 NOTE — Progress Notes (Signed)
  Echocardiogram 2D Echocardiogram limited has been performed.  Leta Jungling M 12/05/2020, 8:14 AM

## 2020-12-05 NOTE — Progress Notes (Signed)
ANTICOAGULATION CONSULT NOTE  Pharmacy Consult for heparin Indication: atrial fibrillation  Heparin Dosing Weight: 99.3 kg  Labs: Recent Labs    12/05/20 0300  HGB 10.1*  HCT 32.4*  PLT 567*  LABPROT 13.5  INR 1.1  CREATININE 1.34*    Assessment: 17 yom with hx PE and recent CABG in March 2022 presenting with SOB, afib with RVR. Pharmacy consulted to dose heparin drip. Patient is not on anticoagulation PTA. Hg 10.1, plt 567. SCr 1.34 (recently baseline 0.8-1.2). No active bleed issues reported.  Goal of Therapy:  Heparin level 0.3-0.7 units/ml Monitor platelets by anticoagulation protocol: Yes   Plan:  Heparin 5000 units bolus Start heparin at 1500 units/hr Check 8hr heparin level Monitor daily CBC, s/sx bleeding Planning DCCV/TEE 4/7 per Cards note   Leia Alf, PharmD, BCPS Clinical Pharmacist 12/05/2020 11:11 AM

## 2020-12-05 NOTE — ED Notes (Signed)
Echo at bedside

## 2020-12-05 NOTE — ED Provider Notes (Signed)
[facility] EMERGENCY DEPARTMENT Provider Note   CSN: [id number] Arrival date & time: 12/05/20  0209     History Chief Complaint  Patient presents with  . Shortness of Breath    Martin [name] is a 75 y.o. male.  The history is provided by the patient and medical records. No language interpreter was used.  Shortness of Breath Severity:  Severe Onset quality:  Sudden Duration:  3 hours Timing:  Constant Progression:  Waxing and waning Chronicity:  New Context: not URI   Relieved by:  Nothing Worsened by:  Nothing Associated symptoms: abdominal pain and diaphoresis   Associated symptoms: no chest pain, no cough, no fever, no headaches, no neck pain, no rash, no sputum production, no vomiting and no wheezing   Risk factors: hx of PE/DVT and recent surgery        Past Medical History:  Diagnosis Date  . Benign prostatic hypertrophy   . Coronary artery disease    cath 04/2007 with 90-95% RCA stenosis. Had DES placed.  Had an acute stent thrombosis in the hospital that day and subsequently had 3 DES placed in the RCA. The LAD had 45% stenosis, in the circ had 35% stenosis. The EF was well preserved.  . Dyslipidemia   . GERD (gastroesophageal reflux disease)   . Ulcerative colitis     Patient Active Problem List   Diagnosis Date Noted  . 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  . CAD (coronary artery disease), native coronary artery 11/20/2020  . Abnormal nuclear stress test   . Essential hypertension 11/01/2020  . 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 Educated about COVID-19 virus infection 12/11/2019  . Dyslipidemia 07/07/2019  . Stenosis of left carotid artery 07/07/2019  . DYSLIPIDEMIA 08/01/2009  . Coronary artery disease involving native coronary artery of native heart with angina pectoris (HCC) 08/01/2009  . GERD 08/01/2009  . COLITIS, ULCERATIVE 08/01/2009  . PALPITATIONS 07/13/2009    Past Surgical History:  Procedure Laterality Date  .  CORONARY ARTERY BYPASS GRAFT N/A 11/21/2020   Procedure: CORONARY ARTERY BYPASS GRAFTING (CABG) TIMES FOUR ON PUMP USING BILATERAL INTERNAL MAMMARY ARTERIES AND LEFT RADIAL ARTERY;  Surgeon: [name], MD;  Location: [facility] OR;  Service: Open Heart Surgery;  Laterality: N/A;  BIMA  . INGUINAL HERNIA REPAIR  1999  . LEFT HEART CATH AND CORONARY ANGIOGRAPHY N/A 11/20/2020   Procedure: LEFT HEART CATH AND CORONARY ANGIOGRAPHY;  Surgeon: Lennette Bihari, MD;  Location: [facility] INVASIVE CV LAB;  Service: Cardiovascular;  Laterality: N/A;  . RADIAL ARTERY HARVEST Left 11/21/2020   Procedure: RADIAL ARTERY HARVEST;  Surgeon: [name], MD;  Location: [facility] OR;  Service: Open Heart Surgery;  Laterality: Left;  . ROTATOR CUFF REPAIR Left   . TEE WITHOUT CARDIOVERSION N/A 11/21/2020   Procedure: TRANSESOPHAGEAL ECHOCARDIOGRAM (TEE);  Surgeon: [name], MD;  Location[facility] OR;  Service: Open Heart Surgery;  Laterality: N/A;       Family History  Problem Relation Age of Onset  . Heart attack Mother   . Heart attack Father   . Heart attack Brother     Social History   Tobacco Use  . Smoking status: Never Smoker  . Smokeless tobacco: Never Used  Substance Use Topics  . Alcohol use: No    Home Medications Prior to Admission medications   Medication Sig Start Date End Date Taking? Authorizing Provider  acetaminophen (TYLENOL) 325 MG tablet Take 2 tablets (650 mg total) by  mouth every 4 (four) hours as needed for headache or mild pain. 11/26/20   Barrett, Erin R, PA-C  ASACOL HD 800 MG TBEC Take 800 mg by mouth 3 (three) times daily. 07/09/15   [provider]  aspirin 81 MG tablet Take 81 mg by mouth daily.    [provider]  atorvastatin (LIPITOR) 80 MG tablet Take 1 tablet (80 mg total) by mouth daily. 11/26/20   Barrett, Erin R, PA-C  colchicine 0.6 MG tablet Take 0.5 tablets (0.3 mg total) by mouth 2 (two) times daily. You may stop this medication if diarrhea develops  11/26/20   Barrett, Erin R, PA-C  furosemide (LASIX) 40 MG tablet Take 1 tablet (40 mg total) by mouth daily. 11/26/20   Barrett, Erin R, PA-C  isosorbide dinitrate (ISORDIL) 10 MG tablet Take 1 tablet (10 mg total) by mouth 3 (three) times daily. 11/26/20   Barrett, Erin R, PA-C  loratadine (CLARITIN) 10 MG tablet Take 10 mg by mouth daily.    [provider]  metoprolol tartrate (LOPRESSOR) 50 MG tablet Take 1 tablet (50 mg total) by mouth 2 (two) times daily. 11/26/20   Barrett, Erin R, PA-C  nitroGLYCERIN (NITROSTAT) 0.4 MG SL tablet Place 0.4 mg under the tongue every 5 (five) minutes as needed for chest pain.    [provider]  omeprazole (PRILOSEC OTC) 20 MG tablet Take 20 mg by mouth daily.    [provider]  potassium chloride SA (KLOR-CON) 20 MEQ tablet Take 1 tablet (20 mEq total) by mouth daily. 11/26/20   Barrett, Erin R, PA-C  tamsulosin (FLOMAX) 0.4 MG CAPS capsule Take 0.4 mg by mouth daily.    [provider]  traMADol (ULTRAM) 50 MG tablet Take 1-2 tablets (50-100 mg total) by mouth every 4 (four) hours as needed for moderate pain. 11/26/20   Barrett, Erin R, PA-C  traZODone (DESYREL) 50 MG tablet Take 50 mg by mouth at bedtime as needed for sleep. 07/25/20   [provider]    Allergies    Morphine  Review of Systems   Review of Systems  Constitutional: Positive for diaphoresis and fatigue. Negative for chills and fever.  HENT: Negative for congestion.   Eyes: Negative for visual disturbance.  Respiratory: Positive for chest tightness and shortness of breath. Negative for cough, sputum production, wheezing and stridor.   Cardiovascular: Negative for chest pain, palpitations and leg swelling.  Gastrointestinal: Positive for abdominal pain. Negative for constipation, diarrhea, nausea and vomiting.  Genitourinary: Negative for dysuria and frequency.  Musculoskeletal: Negative for back pain, neck pain and neck stiffness.  Skin:  Negative for rash and wound.  Neurological: Negative for dizziness, weakness, light-headedness and headaches.  Psychiatric/Behavioral: Negative for agitation.  All other systems reviewed and are negative.   Physical Exam Updated Vital Signs SpO2 98%   Physical Exam Vitals and nursing note reviewed.  Constitutional:      General: He is not in acute distress.    Appearance: He is well-developed. He is not ill-appearing, toxic-appearing or diaphoretic.  HENT:     Head: Normocephalic and atraumatic.  Eyes:     Conjunctiva/sclera: Conjunctivae normal.     Pupils: Pupils are equal, round, and reactive to light.  Cardiovascular:     Rate and Rhythm: Tachycardia present. Rhythm irregular.  No extrasystoles are present.    Heart sounds: No murmur heard.   Pulmonary:     Effort: Pulmonary effort is normal. Tachypnea present. No respiratory distress.  Breath sounds: Normal breath sounds. No decreased breath sounds, wheezing, rhonchi or rales.  Chest:     Chest wall: Tenderness present.  Abdominal:     General: Abdomen is flat.     Palpations: Abdomen is soft.     Tenderness: There is generalized abdominal tenderness.  Musculoskeletal:     Cervical back: Neck supple.     Right lower leg: No tenderness. No edema.     Left lower leg: No tenderness. No edema.  Skin:    General: Skin is warm and dry.     Capillary Refill: Capillary refill takes less than 2 seconds.     Findings: No erythema.  Neurological:     General: No focal deficit present.     Mental Status: He is alert.  Psychiatric:        Mood and Affect: Mood normal.     ED Results / Procedures / Treatments   Labs (all labs ordered are listed, but only abnormal results are displayed) Labs Reviewed  CBC WITH DIFFERENTIAL/PLATELET - Abnormal; Notable for the following components:      Result Value   WBC 11.9 (*)    RBC 3.45 (*)    Hemoglobin 10.1 (*)    HCT 32.4 (*)    Platelets 567 (*)    Neutro Abs 9.1 (*)     Abs Immature Granulocytes 0.13 (*)    All other components within normal limits  COMPREHENSIVE METABOLIC PANEL - Abnormal; Notable for the following components:   Glucose, Bld 119 (*)    BUN 24 (*)    Creatinine, Ser 1.34 (*)    Albumin 2.9 (*)    Alkaline Phosphatase 150 (*)    GFR, Estimated 56 (*)    All other components within normal limits  LACTIC ACID, PLASMA - Abnormal; Notable for the following components:   Lactic Acid, Venous 2.1 (*)    All other components within normal limits  BRAIN NATRIURETIC PEPTIDE - Abnormal; Notable for the following components:   B Natriuretic Peptide 615.2 (*)    All other components within normal limits  TROPONIN I (HIGH SENSITIVITY) - Abnormal; Notable for the following components:   Troponin I (High Sensitivity) 313 (*)    All other components within normal limits  TROPONIN I (HIGH SENSITIVITY) - Abnormal; Notable for the following components:   Troponin I (High Sensitivity) 309 (*)    All other components within normal limits  RESP PANEL BY RT-PCR (FLU A&B, COVID) ARPGX2  LIPASE, BLOOD  PROTIME-INR  MAGNESIUM  LACTIC ACID, PLASMA    EKG EKG Interpretation  Date/Time:  Wednesday December 05 2020 02:34:23 EDT Ventricular Rate:  82 PR Interval:    QRS Duration: 97 QT Interval:  383 QTC Calculation: 466 R Axis:   85 Text Interpretation: Atrial flutter Borderline right axis deviation Borderline repolarization abnormality Minimal ST elevation, inferior leads When compared to prior, now appers to show aflutter. No sTEMI Confirmed by Theda Belfast ([id number]) on 12/05/2020 2:38:09 AM   Radiology DG Chest Portable 1 View  Result Date: 12/05/2020 CLINICAL DATA:  Shortness of breath and weakness starting tonight. Open-heart surgery 2 weeks ago. EXAM: PORTABLE CHEST 1 VIEW COMPARISON:  11/25/2020 FINDINGS: Postoperative changes in the mediastinum. Heart size and pulmonary vascularity are normal. Lungs are clear. No pleural effusions. No pneumothorax.  Mediastinal contours appear intact. Calcification of the aorta. IMPRESSION: No active disease. Electronically Signed   By: Burman Nieves M.D.   On: 12/05/2020 03:23   CT Angio Chest/Abd/Pel  for Dissection W and/or Wo Contrast  Result Date: 12/05/2020 CLINICAL DATA:  75 year old male with chest and back pain. Abdominal pain radiating to the back, diaphoresis. Recent CABG, 2 weeks ago. EXAM: CT ANGIOGRAPHY CHEST, ABDOMEN AND PELVIS TECHNIQUE: Non-contrast CT of the chest was initially obtained. Multidetector CT imaging through the chest, abdomen and pelvis was performed using the standard protocol during bolus administration of intravenous contrast. Multiplanar reconstructed images and MIPs were obtained and reviewed to evaluate the vascular anatomy. CONTRAST:  100mL OMNIPAQUE IOHEXOL 350 MG/ML SOLN COMPARISON:  Chest CT without contrast 11/20/2020. CT Abdomen and Pelvis without and with contrast 05/17/2019. FINDINGS: CTA CHEST FINDINGS Cardiovascular: Interval sequelae of CABG. Extensive atherosclerosis of the thoracic aorta. No aortic aneurysm or dissection. Pulmonary arteries also well enhanced. Central and hilar pulmonary arteries appear to be patent. Moderate new pericardial effusion with simple fluid density. 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 Mediastinum/Nodes: Small volume postoperative mediastinal hematoma. No lymphadenopathy. Moderate to large gastric hiatal hernia peers stable and size and configuration. Incidentally there is a small volume of hyperdense material within the herniated stomach, present on the noncontrast portion. Lungs/Pleura: Stable to mildly larger lung volumes from last month. Major airways remain patent. Chronic atelectasis associated with the gastric hiatal hernia. No pneumothorax or pleural effusion. Small nonspecific focus of subpleural ground-glass opacity in the lateral left upper lobe on series 5 images 31 through 35. Elsewhere lungs  essentially clear. Musculoskeletal: New median sternotomy. No acute osseous abnormality identified. Review of the MIP images confirms the above findings. CTA ABDOMEN AND PELVIS FINDINGS VASCULAR Extensive Aortoiliac calcified atherosclerosis. Negative for abdominal aortic aneurysm or dissection. Major arterial structures remain patent despite atherosclerosis. Less pronounced proximal femoral artery atherosclerosis. Review of the MIP images confirms the above findings. NON-VASCULAR Hepatobiliary: Sludge layering within the gallbladder. No pericholecystic inflammation. Negative liver. Pancreas: Negative. Spleen: Negative. Adrenals/Urinary Tract: Negative. Symmetric enhancement of nonobstructed kidneys. Decompressed ureters and urinary bladder. Stomach/Bowel: No dilated large or small bowel. Redundant large bowel flexures. Cecum is on a lax mesentery. No large bowel inflammation. Normal appendix arising on series 7, image 241. Negative terminal ileum. Chronic gastric hernia. Intra-abdominal stomach and duodenum unremarkable. No free air, free fluid, mesenteric inflammation. Lymphatic: No lymphadenopathy. Reproductive: Sequelae of prostate brachytherapy. Other: No pelvic free fluid. Musculoskeletal: Advanced chronic lumbar spine degeneration. No acute osseous abnormality identified. Review of the MIP images confirms the above findings. IMPRESSION: 1. Sequelae of recent CABG with a new small to moderate pericardial effusion, but low-density pericardial fluid favors transudate. 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 Recommend correlation with echocardiogram. 2. Small new focus of nonspecific subpleural ground-glass opacity in the left upper lobe, perhaps mild pulmonary contusion from chest tube placement. Lungs otherwise stable from last month and negative. 3. Negative for aortic dissection or aneurysm. Extensive Aortic Atherosclerosis (ICD10-I70.0). 4. No other acute or inflammatory  process identified in the chest, abdomen, or pelvis. 5. Moderate to large gastric hiatal hernia. Electronically Signed   By: Odessa FlemingH  Hall M.D.   On: 12/05/2020 04:17    Procedures Procedures   Medications Ordered in ED Medications  fentaNYL (SUBLIMAZE) injection 50 mcg (50 mcg Intravenous Given 12/05/20 0251)  sodium chloride 0.9 % bolus 500 mL (0 mLs Intravenous Stopped 12/05/20 0642)  iohexol (OMNIPAQUE) 350 MG/ML injection 100 mL (100 mLs Intravenous Contrast Given 12/05/20 0359)    ED Course  I have reviewed the triage vital signs and  the nursing notes.  Pertinent labs & imaging results that were available during my care of the patient were reviewed by me and considered in my medical decision making (see chart for details).    MDM Rules/Calculators/A&P                          Martin [name] is a 75 y.o. male with a past medical history significant for CAD with CABG on 11/21/2020 (2 weeks ago), prior pulmonary embolism not currently on anticoagulation, ulcerative colitis, carotid disease, hypertrophic retinopathy, hypertension, and dyslipidemia who presents with shortness of breath, near syncope, hypotension with EMS, diaphoresis, and severe pain in his abdomen and back.  Patient reports that he has done very well for the last 2 weeks and has had no problems postoperatively.  He denies any leg pain or leg swelling.  He says that while doing physical therapy today, he got extremely lightheaded, near syncopal, and winded.  He says that the physical therapist told him to come to the emergency department if it happened again overnight.  Patient says when he tried to go to the bathroom, he took several steps and was completely winded and nearly passed out.  In route to the emergency department, EMS found patient to be hypotensive with blood pressure in the upper 80s systolic and was diaphoretic.  He was given some fluids and this improved.  Patient denies fevers, chills, ingestion, or cough.  He denies  significant chest pain but does report the shortness of breath comes and goes.  He reports he is having severe pain in his abdomen as well as his back.  He does say that he has had prior pulmonary emboli and both lungs and said he had atypical back pain as his symptom with that.  He is not on blood thinners he reports.  Otherwise he denies any trauma and denies any other nausea, vomiting, constipation, or diarrhea.  On exam, back and abdomen are very tender to palpation.  Chest is tender at the suture site but otherwise had clear breath sounds bilaterally.  No murmur appreciated on my initial auscultation.  Patient has palpable pulses in bilateral lower extremities and upper extremities.  No significant edema seen.  EKG shows a flutter with no STEMI.  Patient denies any history of A. fib or a flutter.  Clinically I am concerned about several etiologies of pain.  Given the significant pain in his abdomen going straight to his back, and his recent surgery involving the aorta with the CABG, I do feel we need to rule out a dissection.  I spoke with radiology because I am also concerned about pulmonary embolism and they report that they suspect they can see large pulmonary emboli on the dissection study as opposed to missing the aorta on other imaging.  Patient will also get screening labs, will get a chest x-ray before lab return, and will get electrolytes.  We will touch base with cardiology due to his shortness of breath, hypotension, near syncope, shortness of breath, and new a flutter if other work-up is otherwise reassuring.  Anticipate admission based on patient's initial evaluation and work-up thus far.  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  Echo was recommended.  Cardiology was called who will order echo and  will come see the patient for disposition.  Given his soft pressures, abnormalities on work-up thus far, and recent surgery, anticipate admission for further management.  Care transferred to oncoming team while waiting for cardiology evaluation.   Final Clinical Impression(s) / ED Diagnoses Final diagnoses:  Shortness of breath  Near syncope  New onset atrial flutter (HCC)  Acute back pain, unspecified back location, unspecified back pain laterality     Clinical Impression: 1. Shortness of breath   2. Near syncope   3. New onset atrial flutter (HCC)   4. Acute back pain, unspecified back location, unspecified back pain laterality     Disposition: Awaiting cardiology evaluation recommendations further management, anticipate admission.  This note was prepared with assistance of Conservation officer, historic buildings. Occasional wrong-word or sound-a-like substitutions may have occurred due to the inherent limitations of voice recognition software.     Alexyia Guarino, Canary Brim, MD 12/05/20 [phone]

## 2020-12-05 NOTE — Progress Notes (Signed)
Patient brought to 4E from cath lab. VSS. Telemetry box applied, CCMD notified. CHG bath completed. Patient oriented to room and staff. Call bell in reach.  Kenard Gower, RN

## 2020-12-05 NOTE — Telephone Encounter (Signed)
Attempted to contact to discuss TOC, patient did not answer, unable to leave a message. We will try again.

## 2020-12-05 NOTE — Interval H&P Note (Signed)
History and Physical Interval Note:  12/05/2020 4:08 PM  Martin [name]  has presented today for surgery, with the diagnosis of pericardial effusion.  The various methods of treatment have been discussed with the patient and family. After consideration of risks, benefits and other options for treatment, the patient has consented to  Procedure(s): RIGHT HEART CATH (N/A) and left heart cath (pressures only) as a surgical intervention.  The patient's history has been reviewed, patient examined, no change in status, stable for surgery.  I have reviewed the patient's chart and labs.  Questions were answered to the patient's satisfaction.     Kashay Cavenaugh

## 2020-12-06 ENCOUNTER — Other Ambulatory Visit (HOSPITAL_COMMUNITY): Payer: Self-pay

## 2020-12-06 ENCOUNTER — Encounter (HOSPITAL_COMMUNITY): Payer: Self-pay | Admitting: Cardiovascular Disease

## 2020-12-06 ENCOUNTER — Other Ambulatory Visit (HOSPITAL_COMMUNITY): Payer: Medicare PPO

## 2020-12-06 ENCOUNTER — Other Ambulatory Visit: Payer: Self-pay | Admitting: Physician Assistant

## 2020-12-06 ENCOUNTER — Ambulatory Visit: Payer: Medicare PPO | Admitting: Cardiology

## 2020-12-06 ENCOUNTER — Ambulatory Visit: Payer: Self-pay | Admitting: Cardiothoracic Surgery

## 2020-12-06 DIAGNOSIS — I5021 Acute systolic (congestive) heart failure: Secondary | ICD-10-CM | POA: Diagnosis not present

## 2020-12-06 DIAGNOSIS — I3139 Other pericardial effusion (noninflammatory): Secondary | ICD-10-CM

## 2020-12-06 DIAGNOSIS — I313 Pericardial effusion (noninflammatory): Secondary | ICD-10-CM

## 2020-12-06 LAB — COMPREHENSIVE METABOLIC PANEL
ALT: 27 U/L (ref 0–44)
AST: 24 U/L (ref 15–41)
Albumin: 2.7 g/dL — ABNORMAL LOW (ref 3.5–5.0)
Alkaline Phosphatase: 133 U/L — ABNORMAL HIGH (ref 38–126)
Anion gap: 12 (ref 5–15)
BUN: 22 mg/dL (ref 8–23)
CO2: 23 mmol/L (ref 22–32)
Calcium: 9.3 mg/dL (ref 8.9–10.3)
Chloride: 102 mmol/L (ref 98–111)
Creatinine, Ser: 1.14 mg/dL (ref 0.61–1.24)
GFR, Estimated: >60 mL/min (ref >60)
Glucose, Bld: 104 mg/dL — ABNORMAL HIGH (ref 70–99)
Potassium: 3.6 mmol/L (ref 3.5–5.1)
Sodium: 137 mmol/L (ref 135–145)
Total Bilirubin: 1.2 mg/dL (ref 0.3–1.2)
Total Protein: 6.8 g/dL (ref 6.5–8.1)

## 2020-12-06 LAB — CBC
HCT: 32.2 % — ABNORMAL LOW (ref 39.0–52.0)
Hemoglobin: 10.3 g/dL — ABNORMAL LOW (ref 13.0–17.0)
MCH: 29.6 pg (ref 26.0–34.0)
MCHC: 32 g/dL (ref 30.0–36.0)
MCV: 92.5 fL (ref 80.0–100.0)
Platelets: 545 10*3/uL — ABNORMAL HIGH (ref 150–400)
RBC: 3.48 MIL/uL — ABNORMAL LOW (ref 4.22–5.81)
RDW: 13.9 % (ref 11.5–15.5)
WBC: 9.6 10*3/uL (ref 4.0–10.5)
nRBC: 0 % (ref 0.0–0.2)

## 2020-12-06 LAB — LIPID PANEL
Cholesterol: 129 mg/dL (ref 0–200)
HDL: 22 mg/dL — ABNORMAL LOW (ref >40)
LDL Cholesterol: 73 mg/dL (ref 0–99)
Total CHOL/HDL Ratio: 5.9 RATIO
Triglycerides: 170 mg/dL — ABNORMAL HIGH (ref <150)
VLDL: 34 mg/dL (ref 0–40)

## 2020-12-06 SURGERY — ECHOCARDIOGRAM, TRANSESOPHAGEAL
Anesthesia: Moderate Sedation

## 2020-12-06 SURGERY — ECHOCARDIOGRAM, TRANSESOPHAGEAL
Anesthesia: Monitor Anesthesia Care

## 2020-12-06 MED ORDER — FUROSEMIDE 20 MG PO TABS
20.0000 mg | ORAL_TABLET | Freq: Every day | ORAL | 1 refills | Status: DC | PRN
  Filled 2020-12-06: qty 30, 30d supply, fill #0

## 2020-12-06 MED ORDER — LISINOPRIL 2.5 MG PO TABS
2.5000 mg | ORAL_TABLET | Freq: Every day | ORAL | 11 refills | Status: DC
  Filled 2020-12-06: qty 30, 30d supply, fill #0

## 2020-12-06 MED ORDER — AMIODARONE HCL 200 MG PO TABS: 200.0000 mg | ORAL_TABLET | Freq: Every day | ORAL | Status: DC

## 2020-12-06 MED ORDER — AMIODARONE HCL 200 MG PO TABS
400.0000 mg | ORAL_TABLET | Freq: Two times a day (BID) | ORAL | Status: DC
  Administered 2020-12-06: 400 mg via ORAL
  Filled 2020-12-06: qty 2

## 2020-12-06 MED ORDER — APIXABAN 5 MG PO TABS
5.0000 mg | ORAL_TABLET | Freq: Two times a day (BID) | ORAL | 11 refills | Status: DC
  Filled 2020-12-06: qty 60, 30d supply, fill #0

## 2020-12-06 MED ORDER — AMIODARONE HCL 200 MG PO TABS
ORAL_TABLET | ORAL | 1 refills | Status: DC
  Filled 2020-12-06: qty 50, 30d supply, fill #0

## 2020-12-06 MED ORDER — METOPROLOL SUCCINATE ER 100 MG PO TB24
100.0000 mg | ORAL_TABLET | Freq: Every day | ORAL | 11 refills | Status: DC
  Filled 2020-12-06: qty 30, 30d supply, fill #0

## 2020-12-06 NOTE — Care Management Obs Status (Signed)
MEDICARE OBSERVATION STATUS NOTIFICATION   Patient Details  Name: Martin [name] [name]: [id number] Date of Birth: 07-20-1946   Medicare Observation Status Notification Given:  Yes    Darrold Span, RN 12/06/2020, 12:31 PM

## 2020-12-06 NOTE — Discharge Instructions (Signed)

## 2020-12-06 NOTE — TOC Benefit Eligibility Note (Signed)
Patient Product/process development scientist completed.    The patient is currently admitted and upon discharge could be taking Eliquis 5 mg.  The current 30 day co-pay is, $40.00.   The patient is insured through [facility] Medicare Part D    Roland Earl, CPhT Pharmacy Patient Advocate Specialist [facility] Antimicrobial Stewardship Team Direct Number: [phone]  Fax: [phone]

## 2020-12-06 NOTE — Progress Notes (Signed)
Converted back to sinus rhythm.  Heart catheterization inconsistent with cardiac tamponade.  We will plan to transition to oral amiodarone and oral Eliquis.  He is okay for discharge.  Full discharge summary to follow.  We will plan for close follow-up including a repeat echocardiogram in 1 to 2 weeks.  He has follow-up in our office next week.  Gerri Spore T. Flora Lipps, MD, [facility]  [facility]  [address] [place], [place] [address] [phone]  9:11 AM

## 2020-12-06 NOTE — Discharge Summary (Signed)
Discharge Summary    Patient ID: Martin [name] [name]: [id number]; DOB: [birth year]  Admit date: 12/05/2020 Discharge date: 12/06/2020  PCP:  [name], MD   [facility]  Cardiologist:  Rollene Rotunda, MD    Discharge Diagnoses    Principal Problem:   Acute systolic (congestive) heart failure (HCC) Active Problems:   HLD (hyperlipidemia)   S/P CABG x 4   Atrial flutter (HCC)   Pericardial effusion   AKI (acute kidney injury) (HCC)   Lactic acidosis  Diagnostic Studies/Procedures    Limited Echo 12/05/20 1. Limited for pericardial effusion  2. Left ventricular ejection fraction, by estimation, is 45 to 50%. The  left ventricle has mildly decreased function. There is severe hypokinesis  of the left ventricular, mid-apical anterior wall and anteroseptal wall.  There is severe akinesis of the  left ventricular, entire apical segment.  3. Right ventricular systolic function was not well visualized.  4. Moderate pericardial effusion. The pericardial effusion is  circumferential. There is no evidence of cardiac tamponade.  5. The mitral valve was not assessed.  6. The aortic valve was not assessed.  7. The inferior vena cava is normal in size with greater than 50%  respiratory variability, suggesting right atrial pressure of 3 mmHg.   RIGHT HEART CATH 12/05/20 Pending final report    Flowsheet Row Most Recent Value  Fick Cardiac Output 5.69 L/min  Fick Cardiac Output Index 2.55 (L/min)/BSA  RA A Wave 2 mmHg  RA V Wave 3 mmHg  RA Mean 1 mmHg  RV Systolic Pressure 27 mmHg  RV Diastolic Pressure 7 mmHg  RV EDP 9 mmHg  PA Systolic Pressure 14 mmHg  PA Diastolic Pressure 2 mmHg  PA Mean 8 mmHg  PW A Wave 14 mmHg  PW V Wave 19 mmHg  PW Mean 13 mmHg  AO Systolic Pressure 0 mmHg  AO Diastolic Pressure 0 mmHg  AO Mean -2 mmHg  LV Systolic Pressure 121 mmHg  LV Diastolic Pressure 47 mmHg  LV EDP 55 mmHg  QP/QS 1  TPVR Index 3.14 HRUI      History of Present Illness     Martin [name] is a 75 y.o. male with a hx of CAD s/p PCI to RCA with acute in-stent thrombosis with subsequent DES x 3 in 04/2007 & recent CABG 11/21/20 (LIMA to LAD, RIMA to PDA, radial graft to OM1 and ramus intermedius), UC, GERD, hx of PE and HLD presented with SOB, weakness and fatigue.   He was seen by Dr. Antoine Poche 11/02/2020 with anginal symptoms at which time he was referred for Lexiscan stress test. This was performed on 11/08/2020 which was severely abnormal therefore he was referred for cardiac catheterization for further investigation.  LHC performed 11/20/2020 that showed severe progressive multivessel CAD with recommendations for CABG revascularization. He underwent CABG x4 with LIMA to LAD, RIMA to PDA and sequential radial artery to OM and ramus intermediate on 11/21/2020.  He tolerated procedure well without difficulty.  He was ultimately discharged 11/26/2020.  The patient was doing well after the discharge however developed acute onset shortness of breath while working with home health physical therapy.  This was associated with diaphoresis, fatigue and weakness.  No chest pain.  EMS was called and brought to ER.  Upon arrival, he was found to be hypotensive with an SBP in the 80s and was diaphoretic. He was given IV fluids with improvement. EKG showed atrial flutter with RVR. 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 Also with a small new focus of nonspecific subpleural ground-glass opacity in none   is the left upper lobe, perhaps mild pulmonary contusion from chest tube placement. No aortic dissection or aneurysm and moderate to large gastric hiatal hernia. BNP noted to be 615. HST at 313. CXR with  no pleural effusion, no pneumothorax. Creatinine at 1.34 with a baseline at 0.9-1.0.    Hospital Course     Consultants: None  The patient was admitted for new onset atrial flutter and CHF exacerbation with pericardial effusion.  Started on IV heparin.  Limited echocardiogram showed LV function of 45 to 50% with wall motion abnormality.  Moderate pericardial effusion without evidence of tamponade.  Underwent right cardiac catheterization by Dr. Gala RomneyBensimhon.  Pending final reading.  Flygt cardiac output 5.69 L/min.  LVEDP 55 mmHg.  The patient got IV Lasix 40 mg x 2.  No output recorded accurately. Weight 99 >>92Kg. Symptoms improved.  He is started on Eliquis with plan for TEE cardioversion next morning however spontaneously converted overnight.  Renal function and hemoglobin are relatively stable.  Started on amiodarone 400 mg twice daily for 7 days and then 200 mg daily.  He will continue Eliquis 5 mg twice daily for anticoagulation. Lopressor consolidated to Toprol XL and added Lisinopril given LVEF. Appeared Euvolemic>> PRN lasix recommended.  Plan for limited echocardiogram in 1-2 weeks to access pericardiac effusion.   The patient been seen by Dr. Flora Lipps'Neal today and deemed ready for discharge home. All follow-up appointments have been scheduled. Discharge medications are listed below.    Did the patient have an acute coronary syndrome (MI, NSTEMI, STEMI, etc) this admission?:  No                               Did the patient have a percutaneous coronary intervention (stent / angioplasty)?:  No.       Discharge Vitals Blood pressure 127/72, pulse 82, temperature 98.2 F (36.8 C), temperature source Oral, resp. rate 19, height 6\' 1"  (1.854 m), weight 92.2 kg, SpO2 99 %.  Filed Weights   12/05/20 1100 12/06/20 0412  Weight: 99.3 kg 92.2 kg    Labs & Radiologic Studies    CBC Recent Labs    12/05/20 0300 12/05/20 1654 12/05/20 1700 12/06/20 0225  WBC 11.9*  --   --  9.6  NEUTROABS  9.1*  --   --   --   HGB 10.1*   < > 10.2* 10.3*  HCT 32.4*   < > 30.0* 32.2*  MCV 93.9  --   --  92.5  PLT 567*  --   --  545*   < > = values in this interval not displayed.   Basic Metabolic Panel Recent Labs    16/06/9603/06/22 0300 12/05/20 1654 12/05/20 1700 12/06/20 0225  NA 137   < > 142 137  K 4.1   < > 3.5 3.6  CL 105  --   --  102  CO2 23  --   --  23  GLUCOSE 119*  --   --  104*  BUN 24*  --   --  22  CREATININE 1.34*  --   --  1.14  CALCIUM 9.3  --   --  9.3  MG 1.8  --   --   --    < > = values in this interval not displayed.   Liver Function Tests Recent Labs    12/05/20 0300 12/06/20 0225  AST 28 24  ALT 33 27  ALKPHOS 150* 133*  BILITOT 0.9 1.2  PROT 7.0 6.8  ALBUMIN 2.9* 2.7*   Recent Labs    12/05/20 0300  LIPASE 41   High Sensitivity Troponin:   Recent Labs  Lab 12/05/20 0300 12/05/20 0427  TROPONINIHS 313* 309*    Fasting Lipid Panel Recent Labs    12/06/20 0225  CHOL 129  HDL 22*  LDLCALC 73  TRIG 161*  CHOLHDL 5.9   _____________  DG Chest 2 View  Result Date: 11/25/2020 CLINICAL DATA:  Pleural effusion EXAM: CHEST - 2 VIEW COMPARISON:  11/24/2020 FINDINGS: Lungs are clear. Possible trace left pleural effusion, best evaluated on the lateral view. No pneumothorax. Cardiomegaly.  Postsurgical changes related to prior CABG. Median sternotomy.  Thoracolumbar spine is within normal limits. IMPRESSION: Possible trace left pleural effusion. Electronically Signed   By: Charline Bills M.D.   On: 11/25/2020 07:38   CT CHEST WO CONTRAST  Result Date: 11/20/2020 CLINICAL DATA:  Preoperative planning CABG EXAM: CT CHEST WITHOUT CONTRAST TECHNIQUE: Multidetector CT imaging of the chest was performed following the standard protocol without IV contrast. COMPARISON:  CT 12/17/2017 FINDINGS: Cardiovascular: Limited assessment of the cardiovascular structures given absence of contrast media. There is calcified atherosclerotic plaque throughout the  normal caliber thoracic aorta and normally branching proximal great vessels. No hyperdense mural thickening plaque displacement to suggest an acute intramural hematoma or other features of an acute aortic abnormality. Cardiac size within normal limits. There are extensive 3 vessel coronary artery calcifications with the appearance suggesting prior stent placement. No sizable pericardial effusion. Central pulmonary arteries are normal caliber. Luminal assessment is precluded in the absence of contrast. Small pericaval lipoma near the hiatus (3/119), typically benign incidental. Mediastinum/Nodes: No mediastinal fluid or gas. Normal thyroid gland and thoracic inlet. No acute abnormality of the trachea. Redemonstration of a moderate size hiatal hernia. No worrisome mediastinal or axillary adenopathy. Hilar nodal evaluation is limited in the absence of intravenous contrast media. Lungs/Pleura: Atelectatic changes are again seen in both lung bases, most pronounced in the left lung adjacent the hiatal hernia. Some mild biapical pleuroparenchymal scarring is similar to comparison. No consolidation, features of edema, pneumothorax, or effusion. No suspicious pulmonary nodules or masses. Upper Abdomen: Upper abdominal aortic atherosclerosis. Some layering hyperdense material within the gallbladder, could reflect tiny biliary stones or biliary sand. No pericholecystic inflammation. Musculoskeletal: Degenerative changes are present in the imaged spine and shoulders. Postsurgical changes from prior left shoulder rotator cuff repair. No acute or worrisome chest wall masses or lesions. IMPRESSION: 1. Limited assessment of the cardiovascular structures given absence of intravenous contrast media. 2. No acute intrathoracic process. 3. Aortic Atherosclerosis (ICD10-I70.0). 4. Coronary artery calcifications and coronary stents are present. Please note that the presence of coronary artery calcium documents the presence of coronary  artery disease, the severity of this disease and any potential stenosis cannot be assessed on this non-gated CT examination. 5. Stable moderate size hiatal hernia with adjacent compressive atelectasis. 6. Layering hyperdense material within the gallbladder, could reflect tiny biliary stones or biliary sand. No pericholecystic inflammation. Electronically Signed   By: Kreg Shropshire M.D.   On: 11/20/2020 22:16   CARDIAC CATHETERIZATION  Result Date: 11/20/2020  Prox RCA lesion is 80% stenosed.  Mid RCA-1 lesion is 70% stenosed.  Mid RCA-2 lesion is 90% stenosed.  RPDA lesion is 60% stenosed.  1st Diag lesion is 99% stenosed.  2nd Diag lesion is 90% stenosed.  Ost LAD to Prox LAD lesion is 70% stenosed.  Prox LAD to Mid LAD lesion is 70% stenosed.  Ost Cx to Prox Cx lesion is 50% stenosed.  Prox Cx to Mid Cx lesion is 50% stenosed.  Mid LAD lesion is 95% stenosed.  Mid LAD to Dist LAD lesion is 70% stenosed.  Severe progressive multivessel CAD with 70% proximal, 70%, 95% and 70% mid LAD stenosis with a 99% stenosis in a very proximal diagonal vessel and 90% ostial stenosis in the second diagonal vessel; ostial and proximal 40 to 50% circumflex stenoses; and in-stent restenosis of 80% followed by 70% in the tandem sequential stents the RCA with new 90% stenosis beyond the stented segment and evidence for 50 to 60% ostial stenosis of a large PDA vessel. Preserved global LV contractility with a subtle area of mild mid anterolateral hypocontractility.  EF estimate 55%; LVEDP 25 mm. RECOMMENDATION: Surgical consultation for CABG revascularization surgery.   DG Chest Portable 1 View  Result Date: 12/05/2020 CLINICAL DATA:  Shortness of breath and weakness starting tonight. Open-heart surgery 2 weeks ago. EXAM: PORTABLE CHEST 1 VIEW COMPARISON:  11/25/2020 FINDINGS: Postoperative changes in the mediastinum. Heart size and pulmonary vascularity are normal. Lungs are clear. No pleural effusions. No  pneumothorax. Mediastinal contours appear intact. Calcification of the aorta. IMPRESSION: No active disease. Electronically Signed   By: Burman Nieves M.D.   On: 12/05/2020 03:23   DG Chest Port 1 View  Result Date: 11/24/2020 CLINICAL DATA:  Status post CABG. EXAM: PORTABLE CHEST 1 VIEW COMPARISON:  11/23/2020 and prior studies FINDINGS: Cardiomegaly and CABG changes again noted. Interval removal of RIGHT IJ central venous catheter sheath and mediastinal/thoracostomy tubes noted. Improved aeration within the lungs with continued LEFT LOWER lung atelectasis. There is no evidence of pneumothorax. IMPRESSION: Improved bilateral aeration with continued LEFT LOWER lung atelectasis. Electronically Signed   By: Harmon Pier M.D.   On: 11/24/2020 08:54   DG Chest Port 1 View  Result Date: 11/23/2020 CLINICAL DATA:  Swan-Ganz catheter removal. Recent coronary artery bypass grafting EXAM: PORTABLE CHEST 1 VIEW COMPARISON:  November 22, 2020 FINDINGS: Swan-Ganz catheter has been removed. Cordis tip is in the superior vena cava. Chest tube on the left is unchanged in position. No evident pneumothorax. There is atelectatic change in each lower lung region. There is ill-defined airspace opacity in the left base region. There is cardiomegaly with pulmonary vascularity are normal. No adenopathy. There is aortic atherosclerosis. No bone lesions. IMPRESSION: Tube and catheter positions as described without pneumothorax. Bibasilar atelectasis. New ill-defined opacity left lower lung region, potentially representing early developing pneumonia. Stable cardiomegaly. Postoperative changes noted. Aortic Atherosclerosis (ICD10-I70.0). Electronically Signed   By: Bretta Bang III M.D.   On: 11/23/2020 08:12   DG Chest Port 1 View  Result Date: 11/22/2020 CLINICAL DATA:  Chest tube.  Open-heart surgery. EXAM: PORTABLE CHEST 1 VIEW COMPARISON:  11/21/2020.  CT 11/20/2020. FINDINGS: Interim extubation and removal of NG tube.  Swan-Ganz catheter in stable position. Bilateral chest tubes in stable position. No pneumothorax noted on today's exam. Prior CABG. Stable cardiomegaly. Low lung volumes with persistent bibasilar atelectasis and or infiltrates. Small bilateral pleural effusions cannot be excluded. Hiatal hernia again noted. Mild gastric  distention cannot be excluded. IMPRESSION: 1. Interim extubation and removal of NG tube. Swan-Ganz catheter in stable position. Bilateral chest tubes in stable position. No pneumothorax noted on today's exam. 2. Prior CABG. Stable cardiomegaly. 3. Low lung volumes with persistent bibasilar atelectasis and or infiltrates. 4. Hiatal hernia again noted. Mild gastric distention cannot be excluded. Electronically Signed   By: Maisie Fus  Register   On: 11/22/2020 06:44   DG Chest Port 1 View  Result Date: 11/21/2020 CLINICAL DATA:  Postop. EXAM: PORTABLE CHEST 1 VIEW COMPARISON:  Chest radiograph and CT chest 11/20/2020. FINDINGS: Endotracheal tube terminates approximately 7.0 cm above the carina. Nasogastric tube terminates in the stomach, just beyond the gastroesophageal junction. Right IJ Swan-Ganz catheter appears to be within the proximal right pulmonary artery. Mediastinal drain and bilateral chest tubes are in place. Heart is enlarged. Thoracic aorta is calcified. Lungs are somewhat low in volume with central pulmonary vascular congestion and bibasilar atelectasis. Left apical pleural thickening. Tiny right apical pneumothorax. IMPRESSION: 1. Tiny right apical pneumothorax with right chest tube in place. 2. Low lung volumes with central pulmonary vascular congestion and bibasilar atelectasis. 3.  Aortic atherosclerosis (ICD10-I70.0). Electronically Signed   By: Leanna Battles M.D.   On: 11/21/2020 14:53   DG CHEST PORT 1 VIEW  Result Date: 11/20/2020 CLINICAL DATA:  75 year old male with preop chest radiograph EXAM: PORTABLE CHEST 1 VIEW COMPARISON:  Chest radiograph dated 12/16/2017 FINDINGS:  No focal consolidation, pleural effusion or pneumothorax. Mild cardiomegaly. Atherosclerotic calcification of the aorta. No acute osseous pathology. IMPRESSION: No active disease. Electronically Signed   By: Elgie Collard M.D.   On: 11/20/2020 22:07   MYOCARDIAL PERFUSION IMAGING  Result Date: 11/08/2020  There was no ST segment deviation noted during stress.  The left ventricular ejection fraction is mildly decreased (45-54%).  Nuclear stress EF: 48%.  Defect 1: There is a medium defect of severe severity present in the mid inferolateral, mid anterolateral and apical lateral location.  Defect 2: There is a medium defect of moderate severity present in the basal inferior, mid inferior and apical inferior location.  Defect 3: There is a small defect of mild severity present in the mid anterior and apical anterior location.  Defect 4: There is a small defect of mild severity present in the apex location.  Findings consistent with ischemia and prior myocardial infarction with peri-infarct ischemia.  This is a high risk study.  1. Partially reversible severe mid to apical lateral wall perfusion defect consistent with infarct with peri-infarct ischemia 2. Partially reversible basal to apical inferior wall perfusion defect consistent with infarct with per-infarct ischemia 3. Reversible mid to apical anterior perfusion defect consistent with ischemia 4. Fixed perfusion defect at apex consistent with infarct 5. Mild systolic dysfunction (EF 48%) 6. High risk study given perfusion defects in multiple coronary territories    ECHOCARDIOGRAM COMPLETE  Result Date: 11/20/2020    ECHOCARDIOGRAM REPORT   Patient Name:   Martin [name] Date of Exam: 11/20/2020 Medical Rec #:  [id number]      Height:       73.0 in Accession #:    [id number]     Weight:       221.8 lb Date of Birth:  09-08-1945      BSA:          2.248 m Patient Age:    74 years       BP:           148/79 mmHg Patient  Gender: M              HR:            69 bpm. Exam Location:  Inpatient Procedure: 2D Echo, Cardiac Doppler, Color Doppler and Intracardiac            Opacification Agent Indications:    Preop CABG  History:        Patient has prior history of Echocardiogram examinations.                 Previous Myocardial Infarction and CAD; Signs/Symptoms:Shortness                 of Breath.  Sonographer:    Roosvelt Maser RDCS Referring Phys: [id number] BROADUS Z ATKINS IMPRESSIONS  1. Left ventricular ejection fraction, by estimation, is 55 to 60%. The left ventricle has normal function. The left ventricle has no regional wall motion abnormalities. There is mild concentric left ventricular hypertrophy. Left ventricular diastolic parameters are consistent with Grade I diastolic dysfunction (impaired relaxation).  2. Right ventricular systolic function is normal. The right ventricular size is normal. Tricuspid regurgitation signal is inadequate for assessing PA pressure.  3. The mitral valve is grossly normal. Trivial mitral valve regurgitation. No evidence of mitral stenosis.  4. The aortic valve is tricuspid. There is mild calcification of the aortic valve. Aortic valve regurgitation is trivial. No aortic stenosis is present. FINDINGS  Left Ventricle: Left ventricular ejection fraction, by estimation, is 55 to 60%. The left ventricle has normal function. The left ventricle has no regional wall motion abnormalities. Definity contrast agent was given IV to delineate the left ventricular  endocardial borders. The left ventricular internal cavity size was normal in size. There is mild concentric left ventricular hypertrophy. Left ventricular diastolic parameters are consistent with Grade I diastolic dysfunction (impaired relaxation). Right Ventricle: The right ventricular size is normal. No increase in right ventricular wall thickness. Right ventricular systolic function is normal. Tricuspid regurgitation signal is inadequate for assessing PA pressure. Left Atrium: Left  atrial size was normal in size. Right Atrium: Right atrial size was normal in size. Pericardium: Trivial pericardial effusion is present. Mitral Valve: The mitral valve is grossly normal. Trivial mitral valve regurgitation. No evidence of mitral valve stenosis. Tricuspid Valve: The tricuspid valve is grossly normal. Tricuspid valve regurgitation is trivial. No evidence of tricuspid stenosis. Aortic Valve: The aortic valve is tricuspid. There is mild calcification of the aortic valve. Aortic valve regurgitation is trivial. No aortic stenosis is present. Aortic valve mean gradient measures 4.0 mmHg. Aortic valve peak gradient measures 7.2 mmHg. Aortic valve area, by VTI measures 1.81 cm. Pulmonic Valve: The pulmonic valve was grossly normal. Pulmonic valve regurgitation is not visualized. No evidence of pulmonic stenosis. Aorta: The aortic root and ascending aorta are structurally normal, with no evidence of dilitation. Venous: The inferior vena cava was not well visualized. IAS/Shunts: The atrial septum is grossly normal.  LEFT VENTRICLE PLAX 2D LVIDd:         4.30 cm  Diastology LVIDs:         2.80 cm  LV e' medial:    7.29 cm/s LV PW:         1.20 cm  LV E/e' medial:  11.2 LV IVS:        1.20 cm  LV e' lateral:   8.38 cm/s LVOT diam:     1.90 cm  LV E/e' lateral: 9.8 LV SV:  56 LV SV Index:   25 LVOT Area:     2.84 cm  RIGHT VENTRICLE RV Basal diam:  4.10 cm RV Mid diam:    3.40 cm LEFT ATRIUM             Index       RIGHT ATRIUM           Index LA diam:        5.00 cm 2.22 cm/m  RA Area:     21.30 cm LA Vol (A2C):   76.5 ml 34.03 ml/m RA Volume:   64.20 ml  28.56 ml/m LA Vol (A4C):   56.7 ml 25.22 ml/m LA Biplane Vol: 68.6 ml 30.51 ml/m  AORTIC VALVE AV Area (Vmax):    1.84 cm AV Area (Vmean):   1.91 cm AV Area (VTI):     1.81 cm AV Vmax:           134.00 cm/s AV Vmean:          88.900 cm/s AV VTI:            0.309 m AV Peak Grad:      7.2 mmHg AV Mean Grad:      4.0 mmHg LVOT Vmax:         87.10  cm/s LVOT Vmean:        59.800 cm/s LVOT VTI:          0.197 m LVOT/AV VTI ratio: 0.64  AORTA Ao Root diam: 3.50 cm Ao Asc diam:  2.80 cm MITRAL VALVE MV Area (PHT): 2.87 cm     SHUNTS MV Decel Time: 264 msec     Systemic VTI:  0.20 m MV E velocity: 82.00 cm/s   Systemic Diam: 1.90 cm MV A velocity: 127.00 cm/s MV E/A ratio:  0.65 Lennie Odor MD Electronically signed by Lennie Odor MD Signature Date/Time: 11/20/2020/6:09:44 PM    Final    ECHO INTRAOPERATIVE TEE  Result Date: 11/24/2020  *INTRAOPERATIVE TRANSESOPHAGEAL REPORT *  Patient Name:   Martin [name] Date of Exam: 11/21/2020 Medical Rec #:  [id number]      Height:       73.0 in Accession #:    [id number]     Weight:       227.1 lb Date of Birth:  [birth year]      BSA:          2.27 m Patient Age:    74 years       BP:           126/77 mmHg Patient Gender: M              HR:           78 bpm. Exam Location:  Anesthesiology Transesophogeal exam was perform intraoperatively during surgical procedure. Patient was closely monitored under general anesthesia during the entirety of examination. Indications:     CAD Native Vessel I25.10 Sonographer:     Leta Jungling RDCS Performing Phys: [id number] HYQMVHQ Z ATKINS Diagnosing Phys: Marcene Duos MD Complications: No known complications during this procedure. POST-OP IMPRESSIONS - Left Ventricle: The left ventricle is unchanged from pre-bypass. - Right Ventricle: The right ventricle appears unchanged from pre-bypass. - Aorta: The aorta appears unchanged from pre-bypass. - Left Atrium: The left atrium appears unchanged from pre-bypass. - Left Atrial Appendage: The left atrial appendage appears unchanged from pre-bypass. - Aortic Valve: The aortic valve appears unchanged from pre-bypass. - Mitral Valve: There is trivial regurgitation. -  Tricuspid Valve: The tricuspid valve appears unchanged from pre-bypass. - Pulmonic Valve: The pulmonic valve appears unchanged from pre-bypass. - Interatrial Septum: The  interatrial septum appears unchanged from pre-bypass. - Interventricular Septum: The interventricular septum appears unchanged from pre-bypass. - Pericardium: The pericardium appears unchanged from pre-bypass. PRE-OP FINDINGS  Left Ventricle: The left ventricle has normal systolic function, with an ejection fraction of 60-65%. The cavity size was normal. There is mild concentric left ventricular hypertrophy. Right Ventricle: The right ventricle has normal systolic function. The cavity was normal. There is no increase in right ventricular wall thickness. Left Atrium: Left atrial size was normal in size. No left atrial/left atrial appendage thrombus was detected. Right Atrium: Right atrial size was normal in size. Interatrial Septum: No atrial level shunt detected by color flow Doppler. Pericardium: There is no evidence of pericardial effusion. Mitral Valve: The mitral valve is normal in structure. Mitral valve regurgitation is not visualized by color flow Doppler. Tricuspid Valve: The tricuspid valve was normal in structure. Tricuspid valve regurgitation was not visualized by color flow Doppler. Aortic Valve: The aortic valve is tricuspid Aortic valve regurgitation is trivial by color flow Doppler. There is no stenosis of the aortic valve. Pulmonic Valve: The pulmonic valve was normal in structure. Pulmonic valve regurgitation is trivial by color flow Doppler. Aorta: There is evidence of plaque in the descending aorta.  Marcene Duos MD Electronically signed by Marcene Duos MD Signature Date/Time: 11/24/2020/9:52:59 AM    Final    CT Angio Chest/Abd/Pel for Dissection W and/or Wo Contrast  Result Date: 12/05/2020 CLINICAL DATA:  75 year old male with chest and back pain. Abdominal pain radiating to the back, diaphoresis. Recent CABG, 2 weeks ago. EXAM: CT ANGIOGRAPHY CHEST, ABDOMEN AND PELVIS TECHNIQUE: Non-contrast CT of the chest was initially obtained. Multidetector CT imaging through the chest, abdomen  and pelvis was performed using the standard protocol during bolus administration of intravenous contrast. Multiplanar reconstructed images and MIPs were obtained and reviewed to evaluate the vascular anatomy. CONTRAST:  OMNIPAQUE IOHEXOL 350 MG/ML SOLN COMPARISON:  Chest CT without contrast 11/20/2020. CT Abdomen and Pelvis without and with contrast 05/17/2019. FINDINGS: CTA CHEST FINDINGS Cardiovascular: Interval sequelae of CABG. Extensive atherosclerosis of the thoracic aorta. No aortic aneurysm or dissection. Pulmonary arteries also well enhanced. Central and hilar pulmonary arteries appear to be patent. Moderate new pericardial effusion with simple fluid density. 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 Mediastinum/Nodes: Small volume postoperative mediastinal hematoma. No lymphadenopathy. Moderate to large gastric hiatal hernia peers stable and size and configuration. Incidentally there is a small volume of hyperdense material within the herniated stomach, present on the noncontrast portion. Lungs/Pleura: Stable to mildly larger lung volumes from last month. Major airways remain patent. Chronic atelectasis associated with the gastric hiatal hernia. No pneumothorax or pleural effusion. Small nonspecific focus of subpleural ground-glass opacity in the lateral left upper lobe on series 5 images 31 through 35. Elsewhere lungs essentially clear. Musculoskeletal: New median sternotomy. No acute osseous abnormality identified. Review of the MIP images confirms the above findings. CTA ABDOMEN AND PELVIS FINDINGS VASCULAR Extensive Aortoiliac calcified atherosclerosis. Negative for abdominal aortic aneurysm or dissection. Major arterial structures remain patent despite atherosclerosis. Less pronounced proximal femoral artery atherosclerosis. Review of the MIP images confirms the above findings. NON-VASCULAR Hepatobiliary: Sludge layering within the gallbladder. No pericholecystic  inflammation. Negative liver. Pancreas: Negative. Spleen: Negative. Adrenals/Urinary Tract: Negative. Symmetric enhancement of nonobstructed kidneys. Decompressed ureters and urinary bladder. Stomach/Bowel:  No dilated large or small bowel. Redundant large bowel flexures. Cecum is on a lax mesentery. No large bowel inflammation. Normal appendix arising on series 7, image 241. Negative terminal ileum. Chronic gastric hernia. Intra-abdominal stomach and duodenum unremarkable. No free air, free fluid, mesenteric inflammation. Lymphatic: No lymphadenopathy. Reproductive: Sequelae of prostate brachytherapy. Other: No pelvic free fluid. Musculoskeletal: Advanced chronic lumbar spine degeneration. No acute osseous abnormality identified. Review of the MIP images confirms the above findings. IMPRESSION: 1. Sequelae of recent CABG with a new small to moderate pericardial effusion, but low-density pericardial fluid favors transudate. 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 Recommend correlation with echocardiogram. 2. Small new focus of nonspecific subpleural ground-glass opacity in the left upper lobe, perhaps mild pulmonary contusion from chest tube placement. Lungs otherwise stable from last month and negative. 3. Negative for aortic dissection or aneurysm. Extensive Aortic Atherosclerosis (ICD10-I70.0). 4. No other acute or inflammatory process identified in the chest, abdomen, or pelvis. 5. Moderate to large gastric hiatal hernia. Electronically Signed   By: Odessa Fleming M.D.   On: 12/05/2020 04:17   VAS US DOPPLER PRE CABG  Result Date: 11/21/2020 PREOPERATIVE VASCULAR EVALUATION  Indications:      Pre-CABG. Risk Factors:     Hypertension, hyperlipidemia, coronary artery disease. Comparison Study: 11-08-2020 Carotid duplex study showed 40-59% stenosis of the                   bilateral proximal to mid ICAs. Performing Technologist: Jean Rosenthal RDMS, RVT  Examination Guidelines: A  complete evaluation includes B-mode imaging, spectral Doppler, color Doppler, and power Doppler as needed of all accessible portions of each vessel. Bilateral testing is considered an integral part of a complete examination. Limited examinations for reoccurring indications may be performed as noted.  ABI Findings: +--------+------------------+-----+---------+--------+ Right   Rt Pressure (mmHg)IndexWaveform Comment  +--------+------------------+-----+---------+--------+ [id number]                    triphasic         +--------+------------------+-----+---------+--------+ PTA     187               1.29 biphasic          +--------+------------------+-----+---------+--------+ DP      178               1.23 triphasic         +--------+------------------+-----+---------+--------+ +--------+------------------+-----+---------+-------+ Left    Lt Pressure (mmHg)IndexWaveform Comment +--------+------------------+-----+---------+-------+ [id number]                    triphasic        +--------+------------------+-----+---------+-------+ PTA     187               1.29 triphasic        +--------+------------------+-----+---------+-------+ DP      164               1.13 triphasic        +--------+------------------+-----+---------+-------+  Right Doppler Findings: +--------+--------+-----+---------+--------+ Site    PressureIndexDoppler  Comments +--------+--------+-----+---------+--------+ [id number]          triphasic         +--------+--------+-----+---------+--------+ Radial               triphasic         +--------+--------+-----+---------+--------+ Ulnar                triphasic         +--------+--------+-----+---------+--------+  Left Doppler Findings: +--------+--------+-----+---------+--------+ Site    PressureIndexDoppler  Comments +--------+--------+-----+---------+--------+ [id number]          triphasic          +--------+--------+-----+---------+--------+ Radial               triphasic         +--------+--------+-----+---------+--------+ Ulnar                triphasic         +--------+--------+-----+---------+--------+  Right ABI: Resting right ankle-brachial index is within normal range. No evidence of significant right lower extremity arterial disease. Left ABI: Resting left ankle-brachial index is within normal range. No evidence of significant left lower extremity arterial disease. Right Upper Extremity: Unable to perform Allen's test due to TR band. Left Upper Extremity: Doppler waveforms remain within normal limits with left radial compression. Doppler waveforms remain within normal limits with left ulnar compression.   Electronically signed by Waverly Ferrari MD on 11/21/2020 at 5:15:14 AM.    Final    VAS US CAROTID  Result Date: 11/10/2020 Carotid Arterial Duplex Study Indications:       Bilateral carotid artery stenosis. Patient denies any                    cerebrovascular symptoms. Risk Factors:      Hypertension, hyperlipidemia, no history of smoking, coronary                    artery disease. Comparison Study:  In 12/2019, a carotid duplex showed velocities of 122/34 cm/s                    in the RICA and 200/51 cm/s in the LICA. Performing Technologist: Tyna Jaksch RVT  Examination Guidelines: A complete evaluation includes B-mode imaging, spectral Doppler, color Doppler, and power Doppler as needed of all accessible portions of each vessel. Bilateral testing is considered an integral part of a complete examination. Limited examinations for reoccurring indications may be performed as noted.  Right Carotid Findings: +----------+--------+--------+--------+------------------+------------------+           PSV cm/sEDV cm/sStenosisPlaque DescriptionComments           +----------+--------+--------+--------+------------------+------------------+ CCA Prox  128     15                                                    +----------+--------+--------+--------+------------------+------------------+ CCA Distal87      16      <50%    heterogenous      intimal thickening +----------+--------+--------+--------+------------------+------------------+ ICA Prox  157     43              heterogenous                         +----------+--------+--------+--------+------------------+------------------+ ICA Mid   182     53      40-59%  heterogenous                         +----------+--------+--------+--------+------------------+------------------+ ICA Distal167     38                                                   +----------+--------+--------+--------+------------------+------------------+  ECA       186     48              heterogenous                         +----------+--------+--------+--------+------------------+------------------+ +----------+--------+-------+----------------------+-------------------+           PSV cm/sEDV cmsDescribe              Arm Pressure (mmHG) +----------+--------+-------+----------------------+-------------------+ [id number]            Stenotic and Turbulent147                 +----------+--------+-------+----------------------+-------------------+ +---------+--------+--+--------+--+---------+ VertebralPSV cm/s38EDV cm/s11Antegrade +---------+--------+--+--------+--+---------+  Left Carotid Findings: +----------+-------+-------+--------+------------------------+-----------------+           PSV    EDV    StenosisPlaque Description      Comments                    cm/s   cm/s                                                     +----------+-------+-------+--------+------------------------+-----------------+ CCA Prox  134    17                                                       +----------+-------+-------+--------+------------------------+-----------------+ CCA Distal87     15                                      intimal                                                                   thickening        +----------+-------+-------+--------+------------------------+-----------------+ ICA Prox  195    49             heterogenous and                                                          irregular                                 +----------+-------+-------+--------+------------------------+-----------------+ ICA Mid   231    50     40-59%  heterogenous and                                                          irregular                                 +----------+-------+-------+--------+------------------------+-----------------+  ICA Distal199    35                                     turbulent         +----------+-------+-------+--------+------------------------+-----------------+ ECA       124    6                                                        +----------+-------+-------+--------+------------------------+-----------------+ +----------+--------+--------+---------+-------------------+           PSV cm/sEDV cm/sDescribe Arm Pressure (mmHG) +----------+--------+--------+---------+-------------------+ [id number]             TurbulentIV inserted         +----------+--------+--------+---------+-------------------+ +---------+--------+--+--------+--+---------+ VertebralPSV cm/s46EDV cm/s11Antegrade +---------+--------+--+--------+--+---------+   Summary: Right Carotid: Velocities in the right ICA are consistent with a 40-59%                stenosis. Non-hemodynamically significant plaque <50% noted in                the CCA. Increase RICA velocities compared to prior exam. Left Carotid: Velocities in the left ICA are consistent with a 40-59% stenosis.               Stable LICA velocities.  *See table(s) above for measurements and observations. Suggest follow up study in 12 months. Electronically signed by Nanetta Batty MD on  11/10/2020 at 3:44:19 PM.    Final    ECHOCARDIOGRAM LIMITED  Result Date: 12/05/2020    ECHOCARDIOGRAM LIMITED REPORT   Patient Name:   Martin [name] Date of Exam: 12/05/2020 Medical Rec #:  [id number]      Height:       73.0 in Accession #:    [id number]     Weight:       219.0 lb Date of Birth:  Nov 27, 1945      BSA:          2.236 m Patient Age:    74 years       BP:           120/66 mmHg Patient Gender: M              HR:           76 bpm. Exam Location:  Inpatient Procedure: Limited Echo, Limited Color Doppler and Cardiac Doppler STAT ECHO Indications:    Pericardial effusion I31.3  History:        Patient has prior history of Echocardiogram examinations, most                 recent 11/20/2020. CAD and Previous Myocardial Infarction, Prior                 CABG; Risk Factors:Dyslipidemia. GERD.  Sonographer:    Leta Jungling RDCS Referring Phys: [phone] LINDSAY B ROBERTS IMPRESSIONS  1. Limited for pericardial effusion  2. Left ventricular ejection fraction, by estimation, is 45 to 50%. The left ventricle has mildly decreased function. There is severe hypokinesis of the left ventricular, mid-apical anterior wall and anteroseptal wall. There is severe akinesis of the left ventricular, entire apical segment.  3. Right ventricular systolic function was not well visualized.  4. Moderate pericardial effusion. The pericardial effusion is circumferential.  There is no evidence of cardiac tamponade.  5. The mitral valve was not assessed.  6. The aortic valve was not assessed.  7. The inferior vena cava is normal in size with greater than 50% respiratory variability, suggesting right atrial pressure of 3 mmHg. Comparison(s): Changes from prior study are noted. 11/20/2020: LVEF 55-60%, trivial pericardial effusion. Conclusion(s)/Recommendation(s): Critical findings reported to Geoffry Paradise, NP and acknowledged at 8:41 am on 12/05/20. FINDINGS  Left Ventricle: Left ventricular ejection fraction, by estimation, is 45 to 50%.  The left ventricle has mildly decreased function. Severe hypokinesis of the left ventricular, mid-apical anterior wall and anteroseptal wall. Severe akinesis of the left ventricular, entire apical segment. Right Ventricle: Right ventricular systolic function was not well visualized. Left Atrium: Left atrial size was not assessed. Right Atrium: Right atrial size was not assessed. Pericardium: A moderately sized pericardial effusion is present. The pericardial effusion is circumferential. There is excessive respiratory variation in the tricuspid valve spectral Doppler velocities. There is no evidence of cardiac tamponade. Mitral Valve: The mitral valve was not assessed. Tricuspid Valve: The tricuspid valve is not assessed. Aortic Valve: The aortic valve was not assessed. Pulmonic Valve: The pulmonic valve was not assessed. Aorta: Aortic root could not be assessed. Venous: The inferior vena cava is normal in size with greater than 50% respiratory variability, suggesting right atrial pressure of 3 mmHg.  LV Volumes (MOD) LV vol d, MOD A2C: 108.0 ml LV vol d, MOD A4C: 126.0 ml LV vol s, MOD A2C: 49.6 ml LV vol s, MOD A4C: 74.5 ml LV SV MOD A2C:     58.4 ml LV SV MOD A4C:     126.0 ml LV SV MOD BP:      54.2 ml AORTIC VALVE LVOT Vmax:   83.90 cm/s LVOT Vmean:  66.000 cm/s LVOT VTI:    0.154 m  SHUNTS Systemic VTI: 0.15 m Zoila Shutter MD Electronically signed by Zoila Shutter MD Signature Date/Time: 12/05/2020/10:02:56 AM    Final    Disposition   Pt is being discharged home today in good condition.  Follow-up Plans & Appointments     Follow-up Information    [facility] Follow up.   Specialty: Cardiology Why: office will call with date and time of echo Contact information: [address] [place] [address] [phone]             Discharge Instructions    Diet - low sodium heart healthy   Complete by: As directed    Discharge instructions   Complete by: As  directed    Weigh yourself EVERY morning after you go to the bathroom but before you eat or drink anything. Write this number down in a weight log/diary. If you gain 3 pounds overnight or 5 pounds in a week, call the office. Take your medicines as prescribed. If you have concerns about your medications, please call us before you stop taking them.  Eat low salt foods-Limit salt (sodium) to 2000 mg per day. This will help prevent your body from holding onto fluid. Read food labels as many processed foods have a lot of sodium, especially canned goods and prepackaged meats. If you would like some assistance choosing low sodium foods, we would be happy to set you up with a nutritionist. Stay as active as you can everyday. Staying active will give you more energy and make your muscles stronger. Start with 5 minutes at a time and work your way up to 30  minutes a day. Break up your activities--do some in the morning and some in the afternoon. Start with 3 days per week and work your way up to 5 days as you can. If you have chest pain, feel short of breath, dizzy, or lightheaded, STOP. If you don't feel better after a short rest, call 911. If you do feel better, call the office to let us know you have symptoms with exercise. Limit all fluids for the day to less than 2 liters. Fluid includes all drinks, coffee, juice, ice chips, soup, jello, and all other liquids.   Increase activity slowly   Complete by: As directed    No wound care   Complete by: As directed       Discharge Medications   Allergies as of 12/06/2020      Reactions   Morphine Other (See Comments)   Hot, sweats, flushed      Medication List    STOP taking these medications   meloxicam 7.5 MG tablet Commonly known as: MOBIC   metoprolol tartrate 50 MG tablet Commonly known as: LOPRESSOR   potassium chloride SA 20 MEQ tablet Commonly known as: KLOR-CON     TAKE these medications   acetaminophen 325 MG tablet Commonly known as:  TYLENOL Take 2 tablets (650 mg total) by mouth every 4 (four) hours as needed for headache or mild pain.   amiodarone 200 MG tablet Commonly known as: PACERONE Take 2 tablets ( ) twice a day for 7 days then 1 table ( ) daily   apixaban 5 MG Tabs tablet Commonly known as: ELIQUIS Take 1 tablet (5 mg total) by mouth 2 (two) times daily.   Asacol HD 800 MG Tbec Generic drug: Mesalamine Take 800 mg by mouth 3 (three) times daily.   aspirin 81 MG tablet Take 81 mg by mouth daily.   atorvastatin 80 MG tablet Commonly known as: LIPITOR Take 1 tablet (80 mg total) by mouth daily.   colchicine 0.6 MG tablet Take 0.5 tablets (0.3 mg total) by mouth 2 (two) times daily. You may stop this medication if diarrhea develops   furosemide 20 MG tablet Commonly known as: LASIX Take 1 tablet (20 mg total) by mouth daily as needed for edema (or shortness of breath). What changed:   medication strength  how much to take  when to take this  reasons to take this   isosorbide dinitrate 10 MG tablet Commonly known as: ISORDIL Take 1 tablet (10 mg total) by mouth 3 (three) times daily.   lisinopril 2.5 MG tablet Commonly known as: Zestril Take 1 tablet (2.5 mg total) by mouth daily.   loratadine 10 MG tablet Commonly known as: CLARITIN Take 10 mg by mouth daily.   metoprolol succinate 100 MG 24 hr tablet Commonly known as: Toprol XL Take 1 tablet (100 mg total) by mouth daily. Take with or immediately following a meal.   nitroGLYCERIN 0.4 MG SL tablet Commonly known as: NITROSTAT Place 0.4 mg under the tongue every 5 (five) minutes as needed for chest pain.   omeprazole 20 MG tablet Commonly known as: PRILOSEC OTC Take 20 mg by mouth daily.   tamsulosin 0.4 MG Caps capsule Commonly known as: FLOMAX Take 0.4 mg by mouth daily.   traMADol 50 MG tablet Commonly known as: ULTRAM Take 1-2 tablets (50-100 mg total) by mouth every 4 (four) hours as needed for moderate  pain.   traZODone 50 MG tablet Commonly known as: DESYREL Take 50 mg by mouth  at bedtime as needed for sleep.          Outstanding Labs/Studies   Limited echo   Duration of Discharge Encounter   Greater than 30 minutes including physician time.  Lorelei Pont, PA 12/06/2020, 11:03 AM

## 2020-12-06 NOTE — Care Management CC44 (Signed)
Condition Code 44 Documentation Completed  Patient Details  Name: Martin [name] [name]: [id number] Date of Birth: 1946-05-20   Condition Code 44 given:  Yes Patient signature on Condition Code 44 notice:  Yes Documentation of 2 MD's agreement:  Yes Code 44 added to claim:  Yes    Darrold Span, RN 12/06/2020, 12:31 PM

## 2020-12-06 NOTE — Progress Notes (Signed)
Heart Failure Navigator Progress Note  Assessed for Heart & Vascular TOC clinic readiness.  Pt declined HV TOC stated he gets and takes all his medications appropriately as prescribed. Pt follows up with [facility] and [facility].   Navigator available for reassessment of patient.   Ozella Rocks, RN, BSN Heart Failure Nurse Navigator [phone]

## 2020-12-06 NOTE — Progress Notes (Addendum)
Heart Failure Stewardship Pharmacist Progress Note   PCP: [name], MD PCP-Cardiologist: Rollene Rotunda, MD    HPI:  75 yo M with PMH of CAD s/p CABG in 10/2020, HTN, and HLD. He presented to the ED on 12/05/20 with shortness of breath and weakness. An ECHO was done on 12/05/20 and LVEF was 45-50% (down from 55-60% pre-CABG in 10/2020).   Discharge HF Medications: Furosemide 20 mg daily Metoprolol XL 100 mg daily Lisinopril 2.5 mg daily Isordil 10 mg TID  Prior to admission HF Medications: Furosemide 40 mg daily Metoprolol tartrate 50 mg BID Isordil 10 mg TID  Pertinent Lab Values: . Serum creatinine 1.14, BUN 22, Potassium 3.6, Sodium 137, BNP 615.2, Magnesium 1.8   Vital Signs: . Weight: 203 lbs (admission weight: 218 lbs) . Blood pressure: 122/70s  . Heart rate: 70s   Medication Assistance / Insurance Benefits Check: Does the patient have prescription insurance?  Yes Type of insurance plan: [employer]  Does the patient qualify for medication assistance through manufacturers or grants?   Pending . Eligible grants and/or patient assistance programs: pending . Medication assistance applications in progress: none  . Medication assistance applications approved: none Approved medication assistance renewals will be completed by: pending  Outpatient Pharmacy:  Prior to admission outpatient [facility] Is the patient willing to use [facility] TOC pharmacy at discharge? Yes Is the patient willing to transition their outpatient pharmacy to utilize a [facility] outpatient pharmacy?   Pending    Assessment: 1. Acute on chronic CHF (EF 45-50%), likely due from new atrial flutter. NYHA class II symptoms. - Continue furosemide 20 mg daily at discharge - Agree with transitioning to metoprolol XL 100 mg daily at discharge - On lisinopril 2.5 mg daily - consider optimizing to [facility] as outpatient - Consider starting Jardiance/Farxiga as outpatient - Continue Isordil 10 mg TID    Plan: 1) Medication changes recommended at this time: - Discharge today  - Consider optimization to [facility] / adding [id number] during outpatient follow up   Sharen Hones, PharmD, BCPS Heart Failure Stewardship Pharmacist Phone [phone]

## 2020-12-10 ENCOUNTER — Telehealth: Payer: Self-pay | Admitting: *Deleted

## 2020-12-10 ENCOUNTER — Encounter (HOSPITAL_COMMUNITY): Payer: Self-pay | Admitting: Internal Medicine

## 2020-12-10 NOTE — Telephone Encounter (Signed)
Patient contacted regarding discharge from [facility] hospital on 12/06/20 .  Patient understands to follow up with provider Marjie Skiff PA on 12/11/20 11:15 am at [facility] office. Patient understands discharge instructions. Patient understands medications and regiment. Patient understands to bring all medications to this visit.

## 2020-12-10 NOTE — Telephone Encounter (Signed)
Called patient's wife, Okey Regal, to follow up on call to call service this weekend regarding a low blood pressure. Per Okey Regal, she spoke with our on call provider who referred her to call his cardiologist. Patient is currently holding lisinopril. BP this am is 120/64. Patient follows up with cardiology tomorrow. No further concerns at this time.

## 2020-12-11 ENCOUNTER — Ambulatory Visit: Payer: Medicare PPO | Admitting: Student

## 2020-12-11 ENCOUNTER — Other Ambulatory Visit: Payer: Self-pay

## 2020-12-11 ENCOUNTER — Encounter: Payer: Self-pay | Admitting: Student

## 2020-12-11 VITALS — BP 110/50 | HR 73 | Ht 73.0 in | Wt 206.4 lb

## 2020-12-11 DIAGNOSIS — E785 Hyperlipidemia, unspecified: Secondary | ICD-10-CM

## 2020-12-11 DIAGNOSIS — Z951 Presence of aortocoronary bypass graft: Secondary | ICD-10-CM | POA: Diagnosis not present

## 2020-12-11 DIAGNOSIS — I313 Pericardial effusion (noninflammatory): Secondary | ICD-10-CM

## 2020-12-11 DIAGNOSIS — R7303 Prediabetes: Secondary | ICD-10-CM

## 2020-12-11 DIAGNOSIS — I4892 Unspecified atrial flutter: Secondary | ICD-10-CM | POA: Diagnosis not present

## 2020-12-11 DIAGNOSIS — I2510 Atherosclerotic heart disease of native coronary artery without angina pectoris: Secondary | ICD-10-CM

## 2020-12-11 DIAGNOSIS — I5020 Unspecified systolic (congestive) heart failure: Secondary | ICD-10-CM | POA: Diagnosis not present

## 2020-12-11 DIAGNOSIS — I6523 Occlusion and stenosis of bilateral carotid arteries: Secondary | ICD-10-CM

## 2020-12-11 DIAGNOSIS — I10 Essential (primary) hypertension: Secondary | ICD-10-CM

## 2020-12-11 DIAGNOSIS — F32A Depression, unspecified: Secondary | ICD-10-CM

## 2020-12-11 DIAGNOSIS — R5383 Other fatigue: Secondary | ICD-10-CM

## 2020-12-11 DIAGNOSIS — I3139 Other pericardial effusion (noninflammatory): Secondary | ICD-10-CM

## 2020-12-11 MED ORDER — AMIODARONE HCL 200 MG PO TABS
200.0000 mg | ORAL_TABLET | Freq: Every day | ORAL | 1 refills | Status: DC
Start: 2020-12-11 — End: 2020-12-20

## 2020-12-11 NOTE — Patient Instructions (Signed)
Medication Instructions:   DECREASE Amiodarone to 200 mg (1 tablet) daily on Friday 12/14/20 *If you need a refill on your cardiac medications before your next appointment, please call your pharmacy*  Lab Work: Your physician recommends that you return for lab work TODAY:   CBC  CMET  FLP  TSH  If you have labs (blood work) drawn today and your tests are completely normal, you will receive your results only by: Marland Kitchen MyChart Message (if you have MyChart) OR . A paper copy in the mail If you have any lab test that is abnormal or we need to change your treatment, we will call you to review the results.  Testing/Procedures: NONE ordered at this time of appointment   Follow-Up: At [facility], you and your health needs are our priority.  As part of our continuing mission to provide you with exceptional heart care, we have created designated Provider Care Teams.  These Care Teams include your primary Cardiologist (physician) and Advanced Practice Providers (APPs -  Physician Assistants and Nurse Practitioners) who all work together to provide you with the care you need, when you need it.  Your next appointment:   2 week(s)  The format for your next appointment:   In Person  Provider:   Marjie Skiff, PA-C  Other Instructions

## 2020-12-12 LAB — COMPREHENSIVE METABOLIC PANEL
ALT: 22 IU/L (ref 0–44)
AST: 21 IU/L (ref 0–40)
Albumin/Globulin Ratio: 1.3 (ref 1.2–2.2)
Albumin: 4.3 g/dL (ref 3.7–4.7)
Alkaline Phosphatase: 195 IU/L — ABNORMAL HIGH (ref 44–121)
BUN/Creatinine Ratio: 15 (ref 10–24)
BUN: 18 mg/dL (ref 8–27)
Bilirubin Total: 1.1 mg/dL (ref 0.0–1.2)
CO2: 19 mmol/L — ABNORMAL LOW (ref 20–29)
Calcium: 10.4 mg/dL — ABNORMAL HIGH (ref 8.6–10.2)
Chloride: 102 mmol/L (ref 96–106)
Creatinine, Ser: 1.2 mg/dL (ref 0.76–1.27)
Globulin, Total: 3.4 g/dL (ref 1.5–4.5)
Glucose: 103 mg/dL — ABNORMAL HIGH (ref 65–99)
Potassium: 4.2 mmol/L (ref 3.5–5.2)
Sodium: 139 mmol/L (ref 134–144)
Total Protein: 7.7 g/dL (ref 6.0–8.5)
eGFR: 63 mL/min/{1.73_m2} (ref >59)

## 2020-12-12 LAB — CBC
Hematocrit: 33.7 % — ABNORMAL LOW (ref 37.5–51.0)
Hemoglobin: 10.6 g/dL — ABNORMAL LOW (ref 13.0–17.7)
MCH: 28.5 pg (ref 26.6–33.0)
MCHC: 31.5 g/dL (ref 31.5–35.7)
MCV: 91 fL (ref 79–97)
Platelets: 520 10*3/uL — ABNORMAL HIGH (ref 150–450)
RBC: 3.72 x10E6/uL — ABNORMAL LOW (ref 4.14–5.80)
RDW: 13 % (ref 11.6–15.4)
WBC: 7.1 10*3/uL (ref 3.4–10.8)

## 2020-12-12 LAB — LIPID PANEL
Chol/HDL Ratio: 4.6 ratio (ref 0.0–5.0)
Cholesterol, Total: 139 mg/dL (ref 100–199)
HDL: 30 mg/dL — ABNORMAL LOW (ref >39)
LDL Chol Calc (NIH): 79 mg/dL (ref 0–99)
Triglycerides: 177 mg/dL — ABNORMAL HIGH (ref 0–149)
VLDL Cholesterol Cal: 30 mg/dL (ref 5–40)

## 2020-12-12 LAB — TSH: TSH: 4.57 u[IU]/mL — ABNORMAL HIGH (ref 0.450–4.500)

## 2020-12-13 ENCOUNTER — Other Ambulatory Visit: Payer: Self-pay

## 2020-12-13 DIAGNOSIS — E785 Hyperlipidemia, unspecified: Secondary | ICD-10-CM

## 2020-12-13 DIAGNOSIS — Z79899 Other long term (current) drug therapy: Secondary | ICD-10-CM

## 2020-12-14 ENCOUNTER — Ambulatory Visit (HOSPITAL_COMMUNITY): Payer: Medicare PPO | Attending: Cardiology

## 2020-12-14 ENCOUNTER — Other Ambulatory Visit: Payer: Self-pay

## 2020-12-14 DIAGNOSIS — I3139 Other pericardial effusion (noninflammatory): Secondary | ICD-10-CM

## 2020-12-14 DIAGNOSIS — I313 Pericardial effusion (noninflammatory): Secondary | ICD-10-CM | POA: Diagnosis not present

## 2020-12-18 ENCOUNTER — Other Ambulatory Visit: Payer: Self-pay | Admitting: Physician Assistant

## 2020-12-20 ENCOUNTER — Ambulatory Visit
Admission: RE | Admit: 2020-12-20 | Discharge: 2020-12-20 | Disposition: A | Discharge status: Home / Self Care | Payer: Medicare PPO | Source: Ambulatory Visit | Attending: Cardiothoracic Surgery | Admitting: Cardiothoracic Surgery

## 2020-12-20 ENCOUNTER — Ambulatory Visit (INDEPENDENT_AMBULATORY_CARE_PROVIDER_SITE_OTHER): Payer: Self-pay | Admitting: Cardiothoracic Surgery

## 2020-12-20 ENCOUNTER — Other Ambulatory Visit: Payer: Self-pay

## 2020-12-20 VITALS — BP 95/57 | HR 88 | Temp 97.6°F | Resp 20 | Ht 73.0 in | Wt 210.0 lb

## 2020-12-20 DIAGNOSIS — Z951 Presence of aortocoronary bypass graft: Secondary | ICD-10-CM

## 2020-12-24 NOTE — Progress Notes (Signed)
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  He did well postoperatively until he returned home and then he experienced hypotension.  His outpatient medications have been adjusted for this.  Otherwise he has been slowly improving.  He denies chest pain.  He denies fevers or chills   Current Outpatient Medications  Medication Sig Dispense Refill  . acetaminophen (TYLENOL) 325 MG tablet Take 2 tablets (650 mg total) by mouth every 4 (four) hours as needed for headache or mild pain.    Marland Kitchen apixaban (ELIQUIS) 5 MG TABS tablet Take 1 tablet (5 mg total) by mouth 2 (two) times daily. 60 tablet 11  . ASACOL HD 800 MG TBEC Take 800 mg by mouth 3 (three) times daily.  3  . aspirin 81 MG tablet Take 81 mg by mouth daily.    Marland Kitchen atorvastatin (LIPITOR) 80 MG tablet Take 1 tablet (80 mg total) by mouth daily. 30 tablet 3  . furosemide (LASIX) 20 MG tablet Take 1 tablet (20 mg total) by mouth daily as needed for edema (or shortness of breath). 30 tablet 1  . loratadine (CLARITIN) 10 MG tablet Take 10 mg by mouth daily.    . metoprolol succinate (TOPROL XL) 100 MG 24 hr tablet Take 1 tablet (100 mg total) by mouth daily. Take with or immediately following a meal. (Patient taking differently: Take 25 mg by mouth daily. Take with or immediately following a meal.) 30 tablet 11  . nitroGLYCERIN (NITROSTAT) 0.4 MG SL tablet Place 0.4 mg under the tongue every 5 (five) minutes as needed for chest pain.    Marland Kitchen omeprazole (PRILOSEC OTC) 20 MG tablet Take 20 mg by mouth daily.    . tamsulosin (FLOMAX) 0.4 MG CAPS capsule Take 0.4 mg by mouth daily.    . traZODone (DESYREL) 50 MG tablet Take 50 mg by mouth at bedtime as needed for  sleep.     No current facility-administered medications for this visit.      Physical Exam:   BP (!) 95/57   Pulse 88   Temp 97.6 F (36.4 C) (Skin)   Resp 20   Ht 6\' 1"  (1.854 m)   Wt 95.3 kg   SpO2 96% Comment: RA  BMI 27.71 kg/m   General:  Well-appearing no acute distress  Chest:   Clear to auscultation bilaterally  CV:   Regular rate and rhythm no murmur  Incisions:  Healing well  Abdomen:  Soft nontender  Extremities:  Minimal edema  Diagnostic Tests:  I have personally reviewed his available chest x-ray from 12/20/2020 which demonstrates clear lung fields bilaterally and a stable mediastinal silhouette   Impression:  Doing well after CABG except for mild hypotension  Plan:  Follow-up as needed with thoracic surgery Okay to stop Isordil Stop amiodarone  I spent in excess of 20 minutes during the conduct of this office consultation and >50% of this time involved direct face-to-face encounter with the patient for counseling and/or coordination of their care.  Level 2                 10 minutes Level 3  15 minutes Level 4                 25 minutes Level 5                 40 minutes  B.  Lorayne Marek, MD 12/24/2020 2:30 PM

## 2020-12-25 NOTE — Progress Notes (Addendum)
Cardiology Office Note:    Date:  01/04/2021   ID:  STORY VANVRANKEN, DOB Jul 23, 1946, [name] [id number]  PCP:  [name], MD  Cardiologist:  Rollene Rotunda, MD  Electrophysiologist:  None   Referring MD: [name], MD   Chief Complaint: follow-up of CAD  History of Present Illness:    Martin [name] is a 75 y.o. male with a history of  CAD s/p multiple DES to RCA in 2008 and more recently CABG x4 in 10/2020, post-op atrial flutter on Amiodarone and Eliquis, chronic heart failure with mildly reduced EF of 45-50% on Echo on 12/05/2020, moderate pericardial effusion on Echo on 12/05/2020, moderate bilateral carotid stenosis, hypertension, dyslipidemia, pre-diabetes, GERD, ulcerative colitis, BPH, and prostate cacner who is followed by Dr. Antoine Poche and presents today for follow-up of CAD s/p recent CABG.  Patient has a long history of CAD. Cardiac cath in 04/2007 showed 90-95% stenosis of RCA which was treated with DES. He had acute in-stent thrombosis on the day of PCI in the hospital and subsequently had 3 DES placed to the RCA. Patient was seen by Dr. Antoine Poche in the office on 11/02/2020 at which time he reported profound fatigue and decreased exercise tolerance as well as some chest pain and shortness of breath. Myoview was ordered for further evaluation and was abnormal with multiple defects consistent with ischemia. Decision was made to proceed with cardiac catheterization. LHC on 11/20/2020 showed severe progressive multivessel CAD. Echo showed LVEF of 55-60% with normal wall motion, mild LVH, and grade 1 diastolic dysfunction. CT surgery was consulted and patient underwent CABG x4 (LIMA to LAD, RIMA to PDA, and sequential radial artery to OM and Ramus Intermediate) on 11/21/2020. He also underwent open harvest of his left artery. He tolerated the procedure well and post-op course was unremarkable. He was started on diuretics for volume overload. Of note, IV Nitro was used postoperatively due to  radial artery graft and then he was transitioned to Isordil. He was discharged on 11/26/2020.  Patient was readmitted from 12/05/2020 to 12/06/2020 after presenting with shortness of breath, weakness, and fatigue. Upon arrival to the ED, he was hypotensive and diaphoretics; therefore, he was given IV fluids with improvement. He was found to be in atrial flutter with RVR. BNP was elevated in the 600's and high-sensitivity troponin was elevated in the 300's but flat and consistent with demand ischemia. Chest CTA which was negative for PE and dissection but did show a new small to moderate pericardial effusion. Limited Echo showed LVEF of 45-50% with severe hypokinesis of mid-apical anterior wall and anteroseptal wall, severe akinesis of the entire apical segment, and a moderate pericardial effusion with no evidence of tamponade. Initially there was concern for cardiac tamponade so he underwent RHC with Dr. Gala Romney on 12/05/2020 which showed low filling pressures with normal cardiac output and no evidence of tamponade. He received 2 dose of IV Lasix during admission and symptoms improved. He was started on Eliquis and Amiodarone. Plan was for TEE/DCCV but thankfully patient spontaneously converted the night before this was planned. Patient's Lopressor was transitioned to Toprol-XL and Lisinopril was added given reduced LVEF. Atrial flutter was felt to be secondary to recent CABG and reduced EF was felt to be secondary to atrial flutter. He was discharged on PRN Lasix 20mg  daily for edema or shortness of breath, Amiodarone 400mg  twice daily for 7 days with plans to then transition to 200mg  daily, Toprol-XL 100mg  daily, Lisinopril 10mg  daily, Isordil 10mg  three times daily,  Aspirin 81mg  daily, and Eliquis 5mg  twice daily. Plan is to continue Amiodarone for 1 month and then this can likely be stopped. Repeat Echo ordered for 1-2 weeks for further evaluation of pericardial effusion.  He was seen by me on 12/11/2020 at which  time he was continuing to feel very weak and fatigued. He also reported one hypotensive episode where his systolic BP dropped to the 70s and she was near syncopal with this. He called the on-call provider and was advised to stop Lisinopril. BP improved with this and he had no recurrent episodes of hypotension/near syncope. Patient and wife were concerned that he was depressed and it was recommended that he talk with his PCP.   Repeat Echo on 12/14/2020 showed LVEF of 50-55% with continued moderate pericardial effusion that was felt to possibly be slightly smaller than on prior study.  He was seen by Dr. 02/10/2021 for follow-up on 12/20/2020 at which time he was felt to be recovering slowly but adequately. Isordil and Amiodarone were stopped and he was instructed to follow-up with CT surgery as needed.  Patient presents today for follow-up. Here with wife. Patient is doing much better since I saw him last. He denies any chest pain. He notes very mild dyspnea with exertion which he attributes to just being deconditioned. He states this resolves quickly with rest. No orthopnea, PND, or edema. He has not required any PRN Lasix for weight gain. No palpitations. He continues to have some episodes of dizziness when systolic BP drops into the 90s which it has done a couple of times but much better than previous visit. He has had no recurrent episodes of systolic BP dropping to the 70s since last visit. His Toprol-XL has bene decreased from 100mg  to 50mg  and now 25mg  daily and that has helped his BP and dizzy episodes. He does note very slight nausea and a very slight headache which he has had since surgery; however, he states these are not bad. No syncope. No abnormal bleeding on Eliquis and Aspirin.   Past Medical History:  Diagnosis Date  . Benign prostatic hypertrophy   . Coronary artery disease    cath 04/2007 with 90-95% RCA stenosis. Had DES placed.  Had an acute stent thrombosis in the hospital that day and  subsequently had 3 DES placed in the RCA. The LAD had 45% stenosis, in the circ had 35% stenosis. The EF was well preserved.  . Dyslipidemia   . GERD (gastroesophageal reflux disease)   . Ulcerative colitis     Past Surgical History:  Procedure Laterality Date  . CORONARY ARTERY BYPASS GRAFT N/A 11/21/2020   Procedure: CORONARY ARTERY BYPASS GRAFTING (CABG) TIMES FOUR ON PUMP USING BILATERAL INTERNAL MAMMARY ARTERIES AND LEFT RADIAL ARTERY;  Surgeon: , MD;  Location: [facility] OR;  Service: Open Heart Surgery;  Laterality: N/A;  BIMA  . INGUINAL HERNIA REPAIR  1999  . LEFT HEART CATH AND CORONARY ANGIOGRAPHY N/A 11/20/2020   Procedure: LEFT HEART CATH AND CORONARY ANGIOGRAPHY;  Surgeon: , MD;  Location: [facility] INVASIVE CV LAB;  Service: Cardiovascular;  Laterality: N/A;  . RADIAL ARTERY HARVEST Left 11/21/2020   Procedure: RADIAL ARTERY HARVEST;  Surgeon: 11/23/2020, MD;  Location: [facility] OR;  Service: Open Heart Surgery;  Laterality: Left;  . RIGHT HEART CATH N/A 12/05/2020   Procedure: RIGHT HEART CATH;  Surgeon: 11/22/2020, MD;  Location: [facility] INVASIVE CV LAB;  Service: Cardiovascular;  Laterality: N/A;  . ROTATOR  CUFF REPAIR Left   . TEE WITHOUT CARDIOVERSION N/A 11/21/2020   Procedure: TRANSESOPHAGEAL ECHOCARDIOGRAM (TEE);  Surgeon: [name], MD;  Location: [facility];  Service: Open Heart Surgery;  Laterality: N/A;    Current Medications: Current Meds  Medication Sig  . acetaminophen (TYLENOL) 325 MG tablet Take 2 tablets (650 mg total) by mouth every 4 (four) hours as needed for headache or mild pain.  Marland Kitchen apixaban (ELIQUIS) 5 MG TABS tablet Take 1 tablet (5 mg total) by mouth 2 (two) times daily.  . ASACOL HD 800 MG TBEC Take 800 mg by mouth 3 (three) times daily.  Marland Kitchen aspirin 81 MG tablet Take 81 mg by mouth daily.  Marland Kitchen atorvastatin (LIPITOR) 80 MG tablet Take 1 tablet (80 mg total) by mouth daily.  . furosemide (LASIX) 20 MG tablet Take 1 tablet (20 mg  total) by mouth daily as needed for edema (or shortness of breath).  . loratadine (CLARITIN) 10 MG tablet Take 10 mg by mouth daily.  . nitroGLYCERIN (NITROSTAT) 0.4 MG SL tablet Place 0.4 mg under the tongue every 5 (five) minutes as needed for chest pain.  Marland Kitchen omeprazole (PRILOSEC OTC) 20 MG tablet Take 20 mg by mouth daily.  . tamsulosin (FLOMAX) 0.4 MG CAPS capsule Take 0.4 mg by mouth daily.  . traZODone (DESYREL) 50 MG tablet Take 50 mg by mouth at bedtime as needed for sleep.  . [DISCONTINUED] metoprolol succinate (TOPROL-XL) 25 MG 24 hr tablet Take 25 mg by mouth daily.     Allergies:   Morphine   Social History   Socioeconomic History  . Marital status: Married    Spouse name: Nijee Heatwole  . Number of children: 2  . Years of education: Not on file  . Highest education level: Associate degree: academic program  Occupational History  . Occupation: retired    Comment: [employer]- [employer].  Tobacco Use  . Smoking status: Never Smoker  . Smokeless tobacco: Never Used  Vaping Use  . Vaping Use: Never used  Substance and Sexual Activity  . Alcohol use: Yes    Alcohol/week: 2.0 standard drinks    Types: 2 Cans of beer per week  . Drug use: Not on file  . Sexual activity: Not Currently  Other Topics Concern  . Not on file  Social History Narrative  . Not on file   Social Determinants of Health   Financial Resource Strain: Low Risk   . Difficulty of Paying Living Expenses: Not hard at all  Food Insecurity: No Food Insecurity  . Worried About Programme researcher, broadcasting/film/video in the Last Year: Never true  . Ran Out of Food in the Last Year: Never true  Transportation Needs: No Transportation Needs  . Lack of Transportation (Medical): No  . Lack of Transportation (Non-Medical): No  Physical Activity: Insufficiently Active  . Days of Exercise per Week: 3 days  . Minutes of Exercise per Session: 30 min  Stress: Not on file  Social Connections: Not on file     Family  History: The patient's family history includes Heart attack in his brother, father, and mother.  ROS:   Please see the history of present illness.     EKGs/Labs/Other Studies Reviewed:    The following studies were reviewed today:  Carotid Ultrasound 11/08/2020: Summary:  - Right Carotid: Velocities in the right ICA are consistent with a 40-59% stenosis. Non-hemodynamically significant plaque <50% noted in the CCA. Increase RICA velocities compared to prior exam.  -  Left Carotid: Velocities in the left ICA are consistent with a 40-59% stenosis. Stable LICA velocities.   *See table(s) above for measurements and observations.  Suggest follow up study in 12 months. _______________  Myoview 11/08/2020:   There was no ST segment deviation noted during stress.  The left ventricular ejection fraction is mildly decreased (45-54%).  Nuclear stress EF: 48%.  Defect 1: There is a medium defect of severe severity present in the mid inferolateral, mid anterolateral and apical lateral location.  Defect 2: There is a medium defect of moderate severity present in the basal inferior, mid inferior and apical inferior location.  Defect 3: There is a small defect of mild severity present in the mid anterior and apical anterior location.  Defect 4: There is a small defect of mild severity present in the apex location.  Findings consistent with ischemia and prior myocardial infarction with peri-infarct ischemia.  This is a high risk study.  1. Partially reversible severe mid to apical lateral wall perfusion defect consistent with infarct with peri-infarct ischemia 2. Partially reversible basal to apical inferior wall perfusion defect consistent with infarct with per-infarct ischemia 3. Reversible mid to apical anterior perfusion defect consistent with ischemia 4. Fixed perfusion defect at apex consistent with infarct 5. Mild systolic dysfunction (EF 48%) 6. High risk study given perfusion  defects in multiple coronary territories  _______________  Left Cardiac Catheterization 11/20/2020:   Prox RCA lesion is 80% stenosed.  Mid RCA-1 lesion is 70% stenosed.  Mid RCA-2 lesion is 90% stenosed.  RPDA lesion is 60% stenosed.  1st Diag lesion is 99% stenosed.  2nd Diag lesion is 90% stenosed.  Ost LAD to Prox LAD lesion is 70% stenosed.  Prox LAD to Mid LAD lesion is 70% stenosed.  Ost Cx to Prox Cx lesion is 50% stenosed.  Prox Cx to Mid Cx lesion is 50% stenosed.  Mid LAD lesion is 95% stenosed.  Mid LAD to Dist LAD lesion is 70% stenosed.  Severe progressive multivessel CAD with 70% proximal, 70%, 95% and 70% mid LAD stenosis with a 99% stenosis in a very proximal diagonal vessel and 90% ostial stenosis in the second diagonal vessel; ostial and proximal 40 to 50% circumflex stenoses; and in-stent restenosis of 80% followed by 70% in the tandem sequential stents the RCA with new 90% stenosis beyond the stented segment and evidence for 50 to 60% ostial stenosis of a large PDA vessel.  Preserved global LV contractility with a subtle area of mild mid anterolateral hypocontractility. EF estimate 55%; LVEDP 25 mm.  Recommendations: Surgical consultation for CABG revascularization surgery. _______________  Echocardiogram 11/20/2020: Impressions: 1. Left ventricular ejection fraction, by estimation, is 55 to 60%. The  left ventricle has normal function. The left ventricle has no regional  wall motion abnormalities. There is mild concentric left ventricular  hypertrophy. Left ventricular diastolic  parameters are consistent with Grade I diastolic dysfunction (impaired  relaxation).  2. Right ventricular systolic function is normal. The right ventricular  size is normal. Tricuspid regurgitation signal is inadequate for assessing  PA pressure.  3. The mitral valve is grossly normal. Trivial mitral valve  regurgitation. No evidence of mitral stenosis.  4.  The aortic valve is tricuspid. There is mild calcification of the  aortic valve. Aortic valve regurgitation is trivial. No aortic stenosis is  Present. _______________  Limited Echocardiogram 12/05/2020: Impressions: 1. Limited for pericardial effusion  2. Left ventricular ejection fraction, by estimation, is 45 to 50%. The  left ventricle  has mildly decreased function. There is severe hypokinesis  of the left ventricular, mid-apical anterior wall and anteroseptal wall.  There is severe akinesis of the  left ventricular, entire apical segment.  3. Right ventricular systolic function was not well visualized.  4. Moderate pericardial effusion. The pericardial effusion is  circumferential. There is no evidence of cardiac tamponade.  5. The mitral valve was not assessed.  6. The aortic valve was not assessed.  7. The inferior vena cava is normal in size with greater than 50%  respiratory variability, suggesting right atrial pressure of 3 mmHg.   Comparison(s): Changes from prior study are noted. 11/20/2020: LVEF 55-60%,  trivial pericardial effusion.  _______________  Right Cardiac Catheterization 12/05/2020: Findings: Ao = 121/47 (55) LV = 125/10 RA = 1 RV =15/1 PA = 14/2 (8) PCW = 7 Fick cardiac output/index = 5.7/2.6 PVR = 0.2 WU Ao sat = 99% PA sat = 61%  Simultaneous RV/LV pressures show concordance with deep breathing  Assessment: 1. Low filling pressures with normal cardiac output and no evidence of tamponade.  Plan/Discussion: Continue medical therapy.  Echocardiogram 12/14/2020: Impressions: 1. Limited study with poor acoustic windows.  2. There is a moderate pericardial effusion that appears to be  circumferential but mainly located around the LV. Largest diameter on  limited views was about 1.5cm at end-diastole (posterolateral LV). No  evidence of tamponade features. IVC not  visualized.  3. Based on very limited views, the left ventricular  ejection fraction,  by estimation, is 50 to 55%. Left ventricular endocardial border not  optimally defined to evaluate regional wall motion. There is mild  concentric left ventricular hypertrophy.  4. Right ventricular systolic function was not well visualized. The right  ventricular size is not well visualized.  5. The mitral valve is grossly normal. Trivial mitral valve  regurgitation. No evidence of mitral stenosis.  6. The aortic valve is tricuspid. There is mild calcification of the  aortic valve. Aortic valve regurgitation is trivial. Mild aortic valve  sclerosis is present, with no evidence of aortic valve stenosis.   Comparison(s): Compared to prior echo, the effusion looks slightly smaller  on current study but remains moderate in size.   EKG:  EKG not ordered today.   Recent Labs: 12/05/2020: B Natriuretic Peptide 615.2; Magnesium 1.8 12/11/2020: ALT 22; BUN 18; Creatinine, Ser 1.20; Hemoglobin 10.6; Platelets 520; Potassium 4.2; Sodium 139; TSH 4.570  Recent Lipid Panel    Component Value Date/Time   CHOL 139 12/11/2020 1231   TRIG 177 (H) 12/11/2020 1231   HDL 30 (L) 12/11/2020 1231   CHOLHDL 4.6 12/11/2020 1231   CHOLHDL 5.9 12/06/2020 0225   VLDL 34 12/06/2020 0225   LDLCALC 79 12/11/2020 1231    Physical Exam:    Vital Signs: BP 114/69   Pulse 68   Ht  (1.854 m)   Wt 206 lb (93.4 kg)   SpO2 98%   BMI 27.18 kg/m     Wt Readings from Last 3 Encounters:  01/04/21 206 lb (93.4 kg)  12/20/20 210 lb (95.3 kg)  12/11/20 206 lb 6.4 oz (93.6 kg)     General: 75 y.o. male in no acute distress. HEENT: Normocephalic and atraumatic. Sclera clear.  Neck: Supple. No carotid bruits. No JVD. Heart: RRR. Distinct S1 and S2. No murmurs, gallops, or rubs. Radial  pulses 2+ and equal bilaterally. Lungs: No increased work of breathing. Clear to ausculation bilaterally. No wheezes, rhonchi, or rales.  Abdomen: Soft, non-distended,  and non-tender to palpation.   Extremities: No lower extremity edema.    Skin: Warm and dry. Neuro: Alert and oriented x3. No focal deficits. Psych: Normal affect. Responds appropriately.  Assessment:    1. Coronary artery disease involving native coronary artery of native heart without angina pectoris   2. Post-op atrial flutter   3. HFrEF (heart failure with reduced ejection fraction) (HCC)   4. Pericardial effusion   5. Bilateral carotid artery stenosis   6. Primary hypertension   7. Hyperlipidemia, unspecified hyperlipidemia type   8. Pre-diabetes     Plan:    CAD s/p Recent CABG x4 - S/p CABG x4 (LIMA to LAD, RIMA to PDA, and sequential radial artery to OM and Ramus Intermediate) on 11/20/2020. - No angina.  - Continue aspirin, beta-blocker, and high-intensity statin. - Isordil was stopped at last visit with CT surgery.  Post-Op Atrial Flutter - Readmitted after CABG for atrial flutter. He was started on Amiodarone with restoration of sinus rhythm. No documented recurrence.  - Amiodarone was stopped after 1 month. - Will decrease Toprol-XL 12.5mg  daily as below. - Continue Eliquis  twice daily.  Chronic Heart Failure with Mildly Reduced EF - Limited Echo on 12/05/2020 showed of 45-50% with severe hypokinesis of mid-apical anterior wall and anteroseptal wall and severe akinesis of the entire apical segment. Drop in EF felt to be secondary to atrial flutter. Repeat Echo on 12/14/2020 showed EF of 50-55%. - Appears euvolemic on exam.  - Continue Lasix  daily PRN as needed for shortness of breath, edema, and weight gain. - Will decrease Toprol-XL to 12.5mg  as below. - Lisinopril stopped due to hypotension.  Pericardial Effusion - Echo on 12/05/2020 showed moderate pericardial effusion with no signs of tamponade on RHC that same day.  - Repeat Echo on 12/14/2020 showed persistent moderate pericardial effusion (possibly slightly smaller). - Hemodynamically stable on exam. - Will discuss with MD about  whether any repeat imaging is needed.  Bilateral Carotid Stenosis - Carotid ultrasound in 10/2020 showed 40-59% stenosis of bilateral ICAs. - Continue aspirin and high-intensity statin.  - Plan is for repeat study in 10/2021.  Hypertension - BP well controlled. - Will decrease Toprol-XL to 12.5mg  daily due to continued episodes of soft BP with associated dizziness. - Patient/wife will continue to monitor BP and notify us if BP increases and is consistently above goal of <130/80.  Hyperlipidemia - Lipid panel in 11/2020: Total Cholesterol 139, Triglycerides 177, HDL 30, LDL 79. - LDL goal <70 given CAD. - Lipitor was increased to  daily during recent admission. - Plan is to repeat lipid panel and LFTs in 2-3 months (already ordered).  Pre-Diabetes - Hemoglobin A1c 5.9 during recent admission. - Recommend lifestyle modifications.  Disposition: Patient already has follow-up visit with Dr. Antoine Poche scheduled for 03/05/2021. Will keep this.   ADDENDUM 01/07/2021 at 8:26AM: Discussed with Dr. Antoine Poche about need for repeat Echo for follow-up of pericardial effusion. Dr. Antoine Poche will see him in 03/2021 and may consider repeat Echo at that time.   Medication Adjustments/Labs and Tests Ordered: Current medicines are reviewed at length with the patient today.  Concerns regarding medicines are outlined above.  No orders of the defined types were placed in this encounter.  Meds ordered this encounter  Medications  . metoprolol succinate (TOPROL-XL) 25 MG 24 hr tablet    Sig: Take 0.5 tablets (12.5 mg total) by mouth daily.    Dispense:  45 tablet    Refill:  3  Patient Instructions  Medication Instructions:  Your physician has recommended you make the following change in your medication:   DECREASE: Metoprolol to 12.5mg  (1/2 tablet) daily  *If you need a refill on your cardiac medications before your next appointment, please call your pharmacy*   Lab Work: None If you have  labs (blood work) drawn today and your tests are completely normal, you will receive your results only by: Marland Kitchen MyChart Message (if you have MyChart) OR . A paper copy in the mail If you have any lab test that is abnormal or we need to change your treatment, we will call you to review the results.   Follow-Up: At [facility], you and your health needs are our priority.  As part of our continuing mission to provide you with exceptional heart care, we have created designated Provider Care Teams.  These Care Teams include your primary Cardiologist (physician) and Advanced Practice Providers (APPs -  Physician Assistants and Nurse Practitioners) who all work together to provide you with the care you need, when you need it.   Your next appointment:   As Scheduled    Signed, Corrin Parker, PA-C  01/04/2021 2:45 PM    [facility]

## 2021-01-04 ENCOUNTER — Ambulatory Visit: Payer: Medicare PPO | Admitting: Student

## 2021-01-04 ENCOUNTER — Other Ambulatory Visit: Payer: Self-pay

## 2021-01-04 ENCOUNTER — Encounter: Payer: Self-pay | Admitting: Student

## 2021-01-04 VITALS — BP 114/69 | HR 68 | Ht 73.0 in | Wt 206.0 lb

## 2021-01-04 DIAGNOSIS — I3139 Other pericardial effusion (noninflammatory): Secondary | ICD-10-CM

## 2021-01-04 DIAGNOSIS — E785 Hyperlipidemia, unspecified: Secondary | ICD-10-CM

## 2021-01-04 DIAGNOSIS — I2510 Atherosclerotic heart disease of native coronary artery without angina pectoris: Secondary | ICD-10-CM | POA: Diagnosis not present

## 2021-01-04 DIAGNOSIS — I313 Pericardial effusion (noninflammatory): Secondary | ICD-10-CM

## 2021-01-04 DIAGNOSIS — I5020 Unspecified systolic (congestive) heart failure: Secondary | ICD-10-CM | POA: Diagnosis not present

## 2021-01-04 DIAGNOSIS — I10 Essential (primary) hypertension: Secondary | ICD-10-CM

## 2021-01-04 DIAGNOSIS — I4892 Unspecified atrial flutter: Secondary | ICD-10-CM

## 2021-01-04 DIAGNOSIS — R7303 Prediabetes: Secondary | ICD-10-CM

## 2021-01-04 DIAGNOSIS — I6523 Occlusion and stenosis of bilateral carotid arteries: Secondary | ICD-10-CM

## 2021-01-04 MED ORDER — METOPROLOL SUCCINATE ER 25 MG PO TB24: 12.5000 mg | ORAL_TABLET | Freq: Every day | ORAL | 3 refills | Status: DC

## 2021-01-04 NOTE — Patient Instructions (Signed)
Medication Instructions:  Your physician has recommended you make the following change in your medication:   DECREASE: Metoprolol to 12.5mg  (1/2 tablet) daily  *If you need a refill on your cardiac medications before your next appointment, please call your pharmacy*   Lab Work: None If you have labs (blood work) drawn today and your tests are completely normal, you will receive your results only by: Marland Kitchen MyChart Message (if you have MyChart) OR . A paper copy in the mail If you have any lab test that is abnormal or we need to change your treatment, we will call you to review the results.   Follow-Up: At [facility], you and your health needs are our priority.  As part of our continuing mission to provide you with exceptional heart care, we have created designated Provider Care Teams.  These Care Teams include your primary Cardiologist (physician) and Advanced Practice Providers (APPs -  Physician Assistants and Nurse Practitioners) who all work together to provide you with the care you need, when you need it.   Your next appointment:   As Scheduled

## 2021-01-31 ENCOUNTER — Other Ambulatory Visit (HOSPITAL_COMMUNITY): Payer: Self-pay | Admitting: Nurse Practitioner

## 2021-01-31 ENCOUNTER — Other Ambulatory Visit: Payer: Self-pay | Admitting: Nurse Practitioner

## 2021-01-31 DIAGNOSIS — M5442 Lumbago with sciatica, left side: Secondary | ICD-10-CM

## 2021-02-12 ENCOUNTER — Other Ambulatory Visit: Payer: Self-pay

## 2021-02-12 ENCOUNTER — Ambulatory Visit (HOSPITAL_COMMUNITY)
Admission: RE | Admit: 2021-02-12 | Discharge: 2021-02-12 | Disposition: A | Discharge status: Home / Self Care | Payer: Medicare PPO | Source: Ambulatory Visit | Attending: Nurse Practitioner | Admitting: Nurse Practitioner

## 2021-02-12 DIAGNOSIS — M5442 Lumbago with sciatica, left side: Secondary | ICD-10-CM | POA: Diagnosis present

## 2021-02-21 ENCOUNTER — Other Ambulatory Visit: Payer: Self-pay | Admitting: Physician Assistant

## 2021-03-04 NOTE — Progress Notes (Signed)
Cardiology Office Note   Date:  03/05/2021   ID:  [name], DOB 01-Mar-1946, [name] [id number]  PCP:  [name], MD  Cardiologist:   Rollene Rotunda, MD   Chief Complaint  Patient presents with   Dizziness       History of Present Illness: Martin [name] is a 75 y.o. male who presents for one year follow up of CAD. He had distant multiple stents.  He had increased symptoms and had cath with progressive disease and had CABG x 4.  There was post op atrial flutter and an EF of 45 - 50%.  He had a moderate pericardial effusion which was without tamponade.  He converted spontaneously to NSR but was started on amiodarone.   He had two echoes in April to follow the effusion and the second was stable.  The last EF recorded was 50 - 55%.    The patient has been gradually better since his last hospitalization.  He is doing some yard work.  He has had some episodes of what his son who is a nurse calls presyncope.  He gets dizzy.  This happens usually when he is active and moving around.  He now recognizes that is coming on when he sits down and it will go away.  He describes a blurring of his vision.  He is not however having palpitations.  He has not had any frank syncope.  Is not having any chest pressure, neck or arm discomfort.  He has not had the profound fatigue that he was having.   Past Medical History:  Diagnosis Date   Benign prostatic hypertrophy    Coronary artery disease    cath 04/2007 with 90-95% RCA stenosis. Had DES placed.  Had an acute stent thrombosis in the hospital that day and subsequently had 3 DES placed in the RCA. The LAD had 45% stenosis, in the circ had 35% stenosis. The EF was well preserved.   Dyslipidemia    GERD (gastroesophageal reflux disease)    Ulcerative colitis     Past Surgical History:  Procedure Laterality Date   CORONARY ARTERY BYPASS GRAFT N/A 11/21/2020   Procedure: CORONARY ARTERY BYPASS GRAFTING (CABG) TIMES FOUR ON PUMP USING BILATERAL  INTERNAL MAMMARY ARTERIES AND LEFT RADIAL ARTERY;  Surgeon: [name], MD;  Location: [facility] OR;  Service: Open Heart Surgery;  Laterality: N/A;  BIMA   INGUINAL HERNIA REPAIR  1999   LEFT HEART CATH AND CORONARY ANGIOGRAPHY N/A 11/20/2020   Procedure: LEFT HEART CATH AND CORONARY ANGIOGRAPHY;  Surgeon: Lennette Bihari, MD;  Location: [facility] INV[facility];  Service: Cardiovascular;  Laterality: N/A;   RADIAL ARTERY HARVEST Left 11/21/2020   Procedure: RADIAL ARTERY HARVEST;  Surgeon: [name], MD;  Location: [facility] OR;  Service: Open Heart Surgery;  Laterality: Left;   RIGHT HEART CATH N/A 12/05/2020   Procedure: RIGHT HEART CATH;  Surgeon: Dolores Patty, MD;  Location: [facility];  Service: Cardiovascular;  Laterality: N/A;   ROTATOR CUFF REPAIR Left    TEE WITHOUT CARDIOVERSION N/A 11/21/2020   Procedure: TRANSESOPHAGEAL ECHOCARDIOGRAM (TEE);  Surgeon: [name], MD;  [facility];  Service: Open Heart Surgery;  Laterality: N/A;     Current Outpatient Medications  Medication Sig Dispense Refill   acetaminophen (TYLENOL) 325 MG tablet Take 2 tablets (650 mg total) by mouth every 4 (four) hours as needed for headache or mild pain.     apixaban (ELIQUIS)  5 MG TABS tablet Take 1 tablet (5 mg total) by mouth 2 (two) times daily. 60 tablet 11   ASACOL HD 800 MG TBEC Take 800 mg by mouth 3 (three) times daily.  3   aspirin 81 MG tablet Take 81 mg by mouth daily.     atorvastatin (LIPITOR) 80 MG tablet Take 1 tablet (80 mg total) by mouth daily. 30 tablet 3   furosemide (LASIX) 20 MG tablet Take 1 tablet (20 mg total) by mouth daily as needed for edema (or shortness of breath). 30 tablet 1   loratadine (CLARITIN) 10 MG tablet Take 10 mg by mouth daily.     metoprolol succinate (TOPROL-XL) 25 MG 24 hr tablet Take 0.5 tablets (12.5 mg total) by mouth daily. 45 tablet 3   nitroGLYCERIN (NITROSTAT) 0.4 MG SL tablet Place 0.4 mg under the tongue every 5 (five) minutes as  needed for chest pain.     omeprazole (PRILOSEC OTC) 20 MG tablet Take 20 mg by mouth daily.     tamsulosin (FLOMAX) 0.4 MG CAPS capsule Take 0.4 mg by mouth daily.     traZODone (DESYREL) 50 MG tablet Take 50 mg by mouth at bedtime as needed for sleep.     No current facility-administered medications for this visit.    Allergies:   Morphine    ROS:  Please see the history of present illness.   Otherwise, review of systems are positive for none.   All other systems are reviewed and negative.    PHYSICAL EXAM: VS:  BP 124/68   Pulse 83   Ht 6\' 1"  (1.854 m)   Wt 213 lb (96.6 kg)   SpO2 98%   BMI 28.10 kg/m  , BMI Body mass index is 28.1 kg/m. GENERAL:  Well appearing NECK:  No jugular venous distention, waveform within normal limits, carotid upstroke brisk and symmetric, no bruits, no thyromegaly LUNGS:  Clear to auscultation bilaterally CHEST:  Well healed sternotomy scar. HEART:  PMI not displaced or sustained,S1 and S2 within normal limits, no S3, no S4, no clicks, no rubs, no murmurs ABD:  Flat, positive bowel sounds normal in frequency in pitch, no bruits, no rebound, no guarding, no midline pulsatile mass, no hepatomegaly, no splenomegaly EXT:  2 plus pulses throughout, no edema, no cyanosis no clubbing   EKG:  EKG is  ordered today. The ekg ordered today demonstrates sinus rhythm, rate 83, axis within normal limits, intervals within normal limits, no acute ST-T wave changes.  Poor anterior R wave progression.  Nonspecific inferior lateral T wave flattening.   Recent Labs: 12/05/2020: B Natriuretic Peptide 615.2; Magnesium 1.8 12/11/2020: ALT 22; BUN 18; Creatinine, Ser 1.20; Hemoglobin 10.6; Platelets 520; Potassium 4.2; Sodium 139; TSH 4.570      Wt Readings from Last 3 Encounters:  03/05/21 213 lb (96.6 kg)  01/04/21 206 lb (93.4 kg)  12/20/20 210 lb (95.3 kg)      Other studies Reviewed: Additional studies/ records that were reviewed today include: Hospital  records and labs. Review of the above records demonstrates:  Please see elsewhere in the note.     ASSESSMENT AND PLAN:  CAD -  The patient has no new sypmtoms.  No further cardiovascular testing is indicated.  We will continue with aggressive risk reduction and meds as listed.  He did not participate in cardiac rehab.    DYSLIPIDEMIA -  I will have him get a lipid profile when he returns for his echocardiogram.  HTN -  Blood pressures well controlled.  No change in therapy.  ATRIAL FLUTTER:   There is no evidence that he is doing this although he is having these presyncopal episodes.  He has a wearable that can do an EKG and his son is going to get this working and if not he might need to have a monitor.  However, if we can capture some of his presyncope and there is no evidence of flutter and he has no other evidence of flutter symptomatically or otherwise when he comes back I would likely discontinue his Eliquis.  PERICARDIAL EFFUSION: I will follow-up with an echocardiogram.    Current medicines are reviewed at length with the patient today.  The patient does not have concerns regarding medicines.  The following changes have been made:   None  Labs/ tests ordered today include:   Orders Placed This Encounter  Procedures   Lipid panel   EKG 12-Lead   ECHOCARDIOGRAM COMPLETE      Disposition:   FU with me in 4 months.    Signed, Rollene Rotunda, MD  03/05/2021 2:21 PM    [facility]

## 2021-03-05 ENCOUNTER — Other Ambulatory Visit: Payer: Self-pay

## 2021-03-05 ENCOUNTER — Encounter: Payer: Self-pay | Admitting: Cardiology

## 2021-03-05 ENCOUNTER — Ambulatory Visit: Payer: Medicare PPO | Admitting: Cardiology

## 2021-03-05 VITALS — BP 124/68 | HR 83 | Ht 73.0 in | Wt 213.0 lb

## 2021-03-05 DIAGNOSIS — E785 Hyperlipidemia, unspecified: Secondary | ICD-10-CM

## 2021-03-05 DIAGNOSIS — I4892 Unspecified atrial flutter: Secondary | ICD-10-CM

## 2021-03-05 DIAGNOSIS — I313 Pericardial effusion (noninflammatory): Secondary | ICD-10-CM | POA: Diagnosis not present

## 2021-03-05 DIAGNOSIS — I25118 Atherosclerotic heart disease of native coronary artery with other forms of angina pectoris: Secondary | ICD-10-CM | POA: Diagnosis not present

## 2021-03-05 DIAGNOSIS — I10 Essential (primary) hypertension: Secondary | ICD-10-CM

## 2021-03-05 DIAGNOSIS — I3139 Other pericardial effusion (noninflammatory): Secondary | ICD-10-CM

## 2021-03-05 NOTE — Patient Instructions (Addendum)
Medication Instructions:  Your physician recommends that you continue on your current medications as directed. Please refer to the Current Medication list given to you today.  *If you need a refill on your cardiac medications before your next appointment, please call your pharmacy*   Lab Work: Please return for FASTING labs same day as echocardiogram (Lipid) at our [facility].  If you have labs (blood work) drawn today and your tests are completely normal, you will receive your results only by: MyChart Message (if you have MyChart) OR A paper copy in the mail If you have any lab test that is abnormal or we need to change your treatment, we will call you to review the results.   Testing/Procedures: Your physician has requested that you have an echocardiogram in [place]. Echocardiography is a painless test that uses sound waves to create images of your heart. It provides your doctor with information about the size and shape of your heart and how well your heart's chambers and valves are working. This procedure takes approximately one hour. There are no restrictions for this procedure. This will be done at our [place] location:  [address]  Follow-Up: At [facility], you and your health needs are our priority.  As part of our continuing mission to provide you with exceptional heart care, we have created designated Provider Care Teams.  These Care Teams include your primary Cardiologist (physician) and Advanced Practice Providers (APPs -  Physician Assistants and Nurse Practitioners) who all work together to provide you with the care you need, when you need it.  We recommend signing up for the patient portal called "MyChart".  Sign up information is provided on this After Visit Summary.  MyChart is used to connect with patients for Virtual Visits (Telemedicine).  Patients are able to view lab/test results, encounter notes, upcoming appointments, etc.   Non-urgent messages can be sent to your provider as well.   To learn more about what you can do with MyChart, go to [email or web address].    Your next appointment:   3 month(s)  The format for your next appointment:   In Person  Provider:   Rollene Rotunda, MD or Marjie Skiff PA

## 2021-03-05 NOTE — Addendum Note (Signed)
Addended by: Johney Frame A on: 03/05/2021 03:41 PM   Modules accepted: Orders

## 2021-03-26 ENCOUNTER — Ambulatory Visit (HOSPITAL_COMMUNITY): Payer: Medicare PPO | Attending: Internal Medicine

## 2021-03-26 ENCOUNTER — Other Ambulatory Visit: Payer: Medicare PPO | Admitting: *Deleted

## 2021-03-26 ENCOUNTER — Other Ambulatory Visit: Payer: Self-pay

## 2021-03-26 DIAGNOSIS — I3139 Other pericardial effusion (noninflammatory): Secondary | ICD-10-CM

## 2021-03-26 DIAGNOSIS — E785 Hyperlipidemia, unspecified: Secondary | ICD-10-CM

## 2021-03-26 DIAGNOSIS — I313 Pericardial effusion (noninflammatory): Secondary | ICD-10-CM

## 2021-03-26 LAB — LIPID PANEL
Chol/HDL Ratio: 5 ratio (ref 0.0–5.0)
Cholesterol, Total: 164 mg/dL (ref 100–199)
HDL: 33 mg/dL — ABNORMAL LOW (ref >39)
LDL Chol Calc (NIH): 103 mg/dL — ABNORMAL HIGH (ref 0–99)
Triglycerides: 161 mg/dL — ABNORMAL HIGH (ref 0–149)
VLDL Cholesterol Cal: 28 mg/dL (ref 5–40)

## 2021-03-26 LAB — ECHOCARDIOGRAM LIMITED
Area-P 1/2: 3.53 cm2
S' Lateral: 2 cm

## 2021-03-29 ENCOUNTER — Encounter: Payer: Self-pay | Admitting: *Deleted

## 2021-04-02 ENCOUNTER — Other Ambulatory Visit: Payer: Self-pay

## 2021-04-02 DIAGNOSIS — E785 Hyperlipidemia, unspecified: Secondary | ICD-10-CM

## 2021-04-02 MED ORDER — ROSUVASTATIN CALCIUM 40 MG PO TABS: 40.0000 mg | ORAL_TABLET | Freq: Every day | ORAL | 3 refills | Status: DC

## 2021-05-04 ENCOUNTER — Other Ambulatory Visit: Payer: Self-pay | Admitting: Physician Assistant
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
## 2021-06-07 ENCOUNTER — Encounter: Payer: Self-pay | Admitting: Cardiology

## 2021-06-07 ENCOUNTER — Other Ambulatory Visit: Payer: Self-pay

## 2021-06-07 ENCOUNTER — Ambulatory Visit: Payer: Medicare PPO | Admitting: Cardiology

## 2021-06-07 VITALS — BP 100/62 | HR 67 | Ht 73.0 in | Wt 216.0 lb

## 2021-06-07 DIAGNOSIS — I4892 Unspecified atrial flutter: Secondary | ICD-10-CM | POA: Diagnosis not present

## 2021-06-07 DIAGNOSIS — I10 Essential (primary) hypertension: Secondary | ICD-10-CM

## 2021-06-07 DIAGNOSIS — E785 Hyperlipidemia, unspecified: Secondary | ICD-10-CM

## 2021-06-07 DIAGNOSIS — I25118 Atherosclerotic heart disease of native coronary artery with other forms of angina pectoris: Secondary | ICD-10-CM | POA: Diagnosis not present

## 2021-06-07 NOTE — Patient Instructions (Signed)
Medication Instructions:  STOP ELIQUIS  *If you need a refill on your cardiac medications before your next appointment, please call your pharmacy*  Lab Work: NONE   Testing/Procedures: NONE  Follow-Up: At [facility], you and your health needs are our priority.  As part of our continuing mission to provide you with exceptional heart care, we have created designated Provider Care Teams.  These Care Teams include your primary Cardiologist (physician) and Advanced Practice Providers (APPs -  Physician Assistants and Nurse Practitioners) who all work together to provide you with the care you need, when you need it.  We recommend signing up for the patient portal called "MyChart".  Sign up information is provided on this After Visit Summary.  MyChart is used to connect with patients for Virtual Visits (Telemedicine).  Patients are able to view lab/test results, encounter notes, upcoming appointments, etc.  Non-urgent messages can be sent to your provider as well.   To learn more about what you can do with MyChart, go to [email or web address].    Your next appointment:   12 month(s)  The format for your next appointment:   In Person  Provider:   You may see Rollene Rotunda, MD or one of the following Advanced Practice Providers on your designated Care Team:   Theodore Demark, PA-C Juanda Crumble, PA-C Joni Reining, DNP, ANP

## 2021-07-01 ENCOUNTER — Other Ambulatory Visit: Payer: Self-pay | Admitting: Cardiology

## 2021-07-01 DIAGNOSIS — E785 Hyperlipidemia, unspecified: Secondary | ICD-10-CM

## 2021-08-03 IMAGING — DX DG CHEST 1V PORT
1 series · 1 of 1 positions shown · non-contrast
Comparison: 11/25/2020

CLINICAL DATA: Shortness of breath and weakness starting tonight.
Open-heart surgery 2 weeks ago.

EXAM:
PORTABLE CHEST 1 VIEW

[chest]
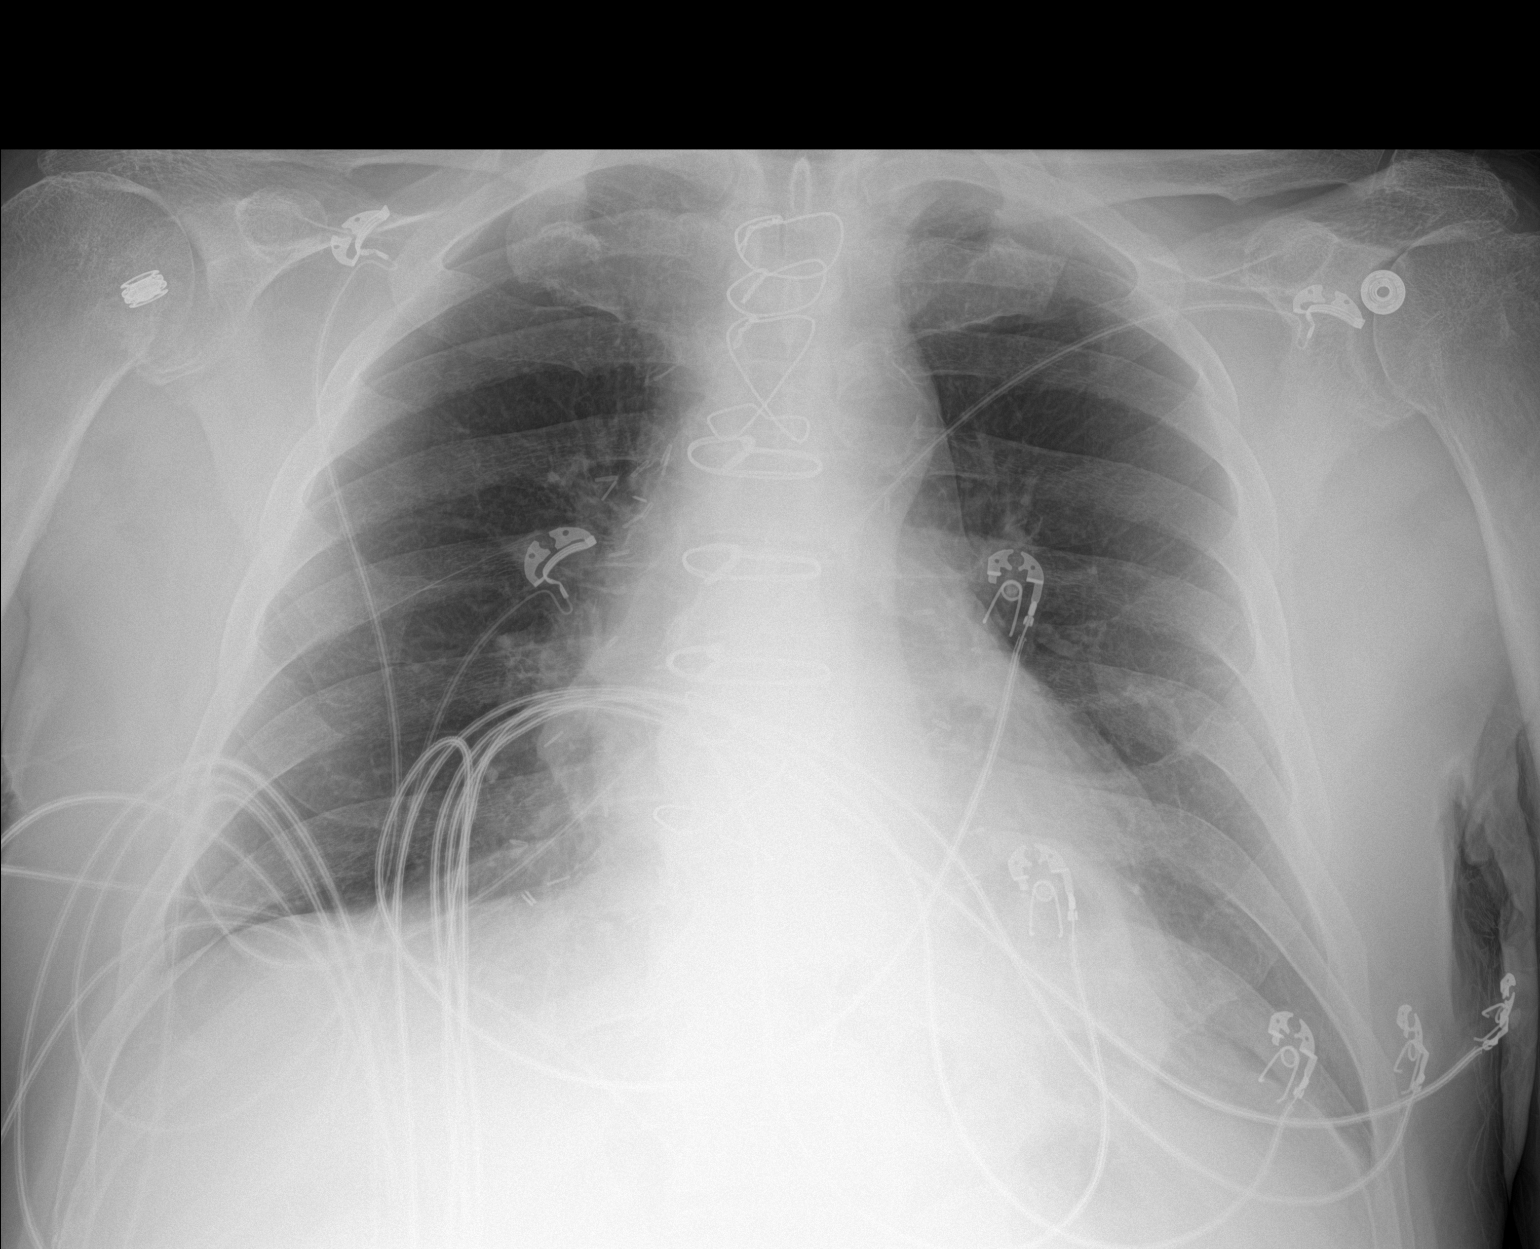

[1 of 1 positions shown; findings below may reference images not displayed]

FINDINGS: Postoperative changes in the mediastinum. Heart size and pulmonary
vascularity are normal. Lungs are clear. No pleural effusions. No
pneumothorax. Mediastinal contours appear intact. Calcification of
the aorta.
IMPRESSION: No active disease.

## 2021-08-18 IMAGING — DX DG CHEST 2V
2 series · 2 of 2 positions shown · non-contrast
Comparison: December 05, 2020

CLINICAL DATA: History of prior CABG with shortness of breath.

EXAM:
CHEST - 2 VIEW

[dg chest 2 view (1 of 2)]
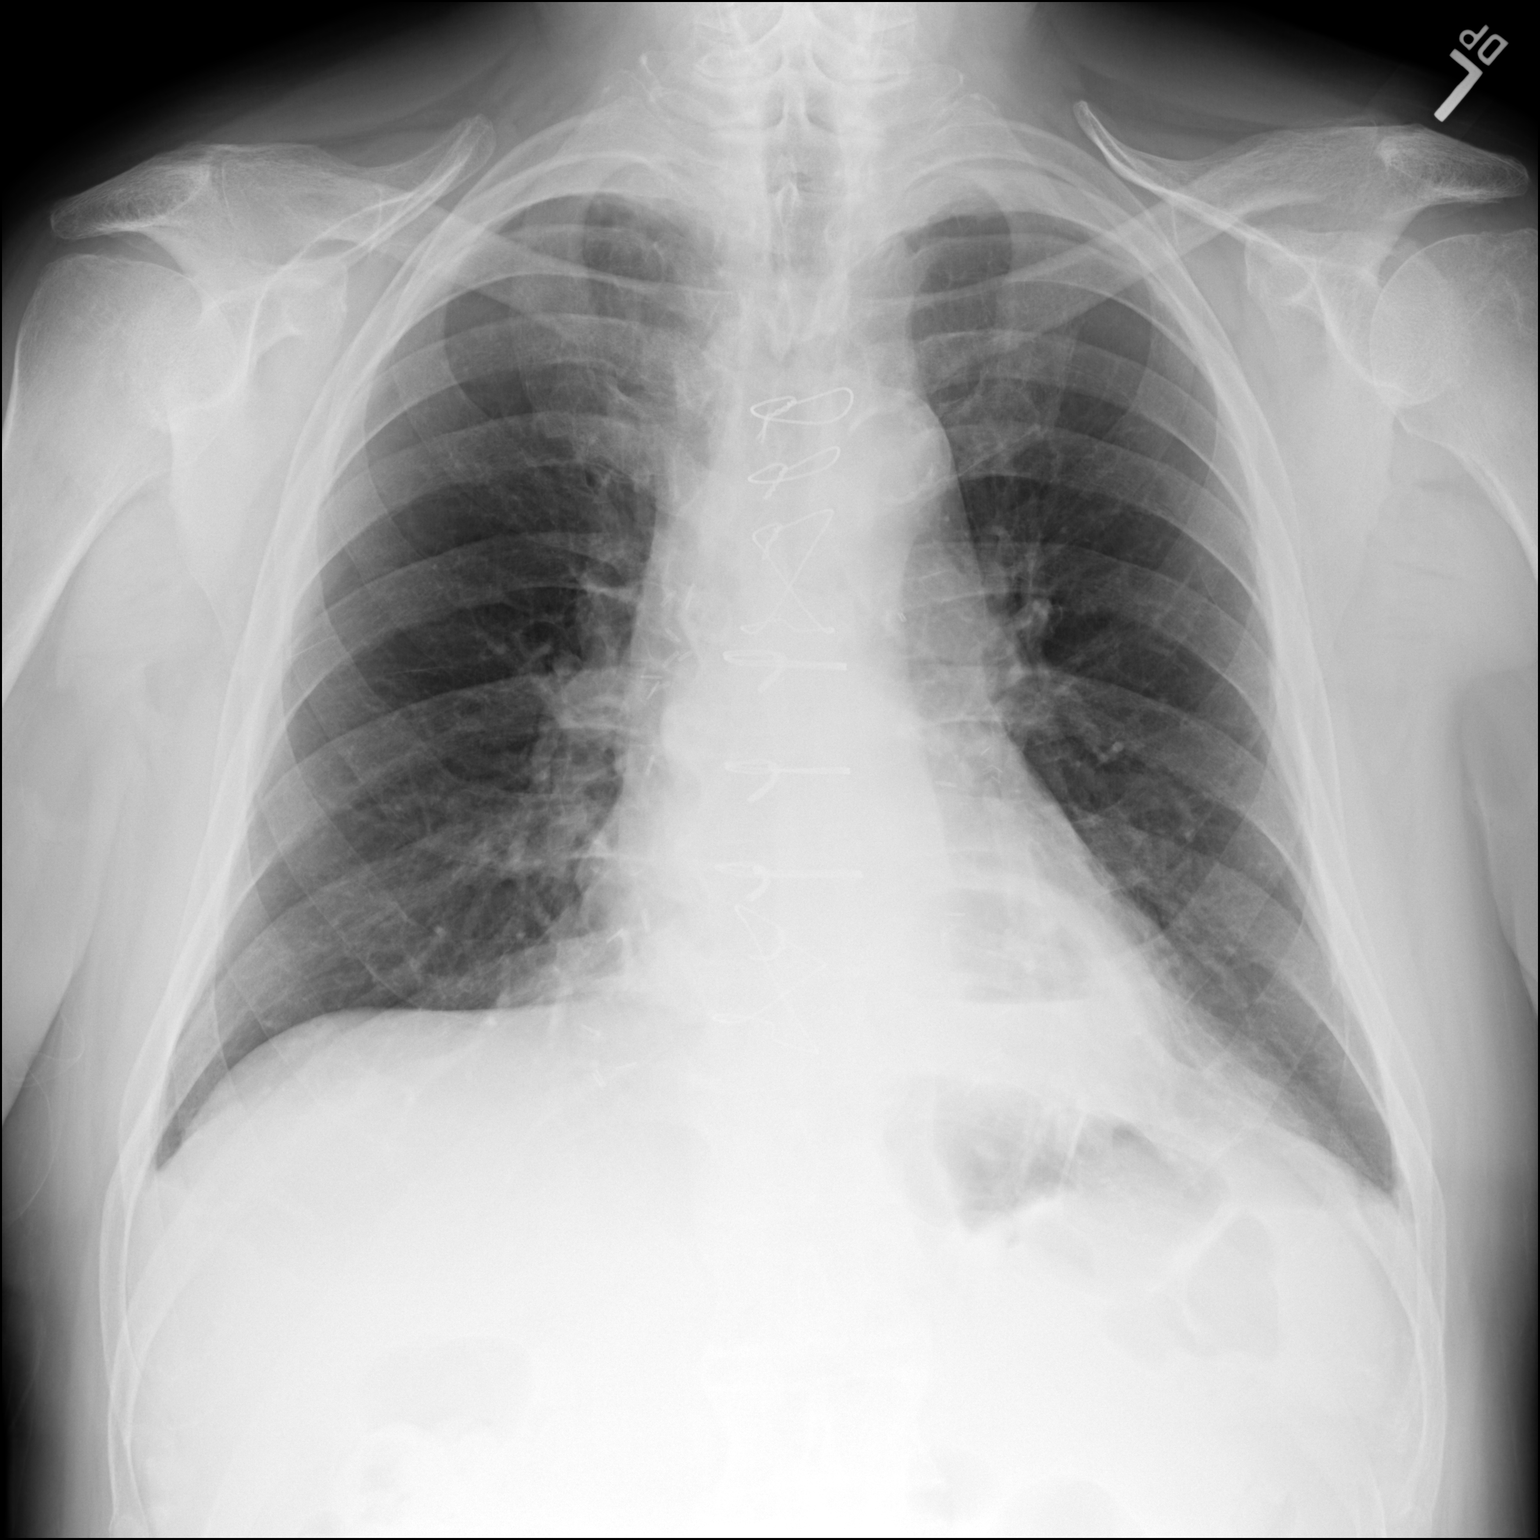

[dg chest 2 view (2 of 2)]
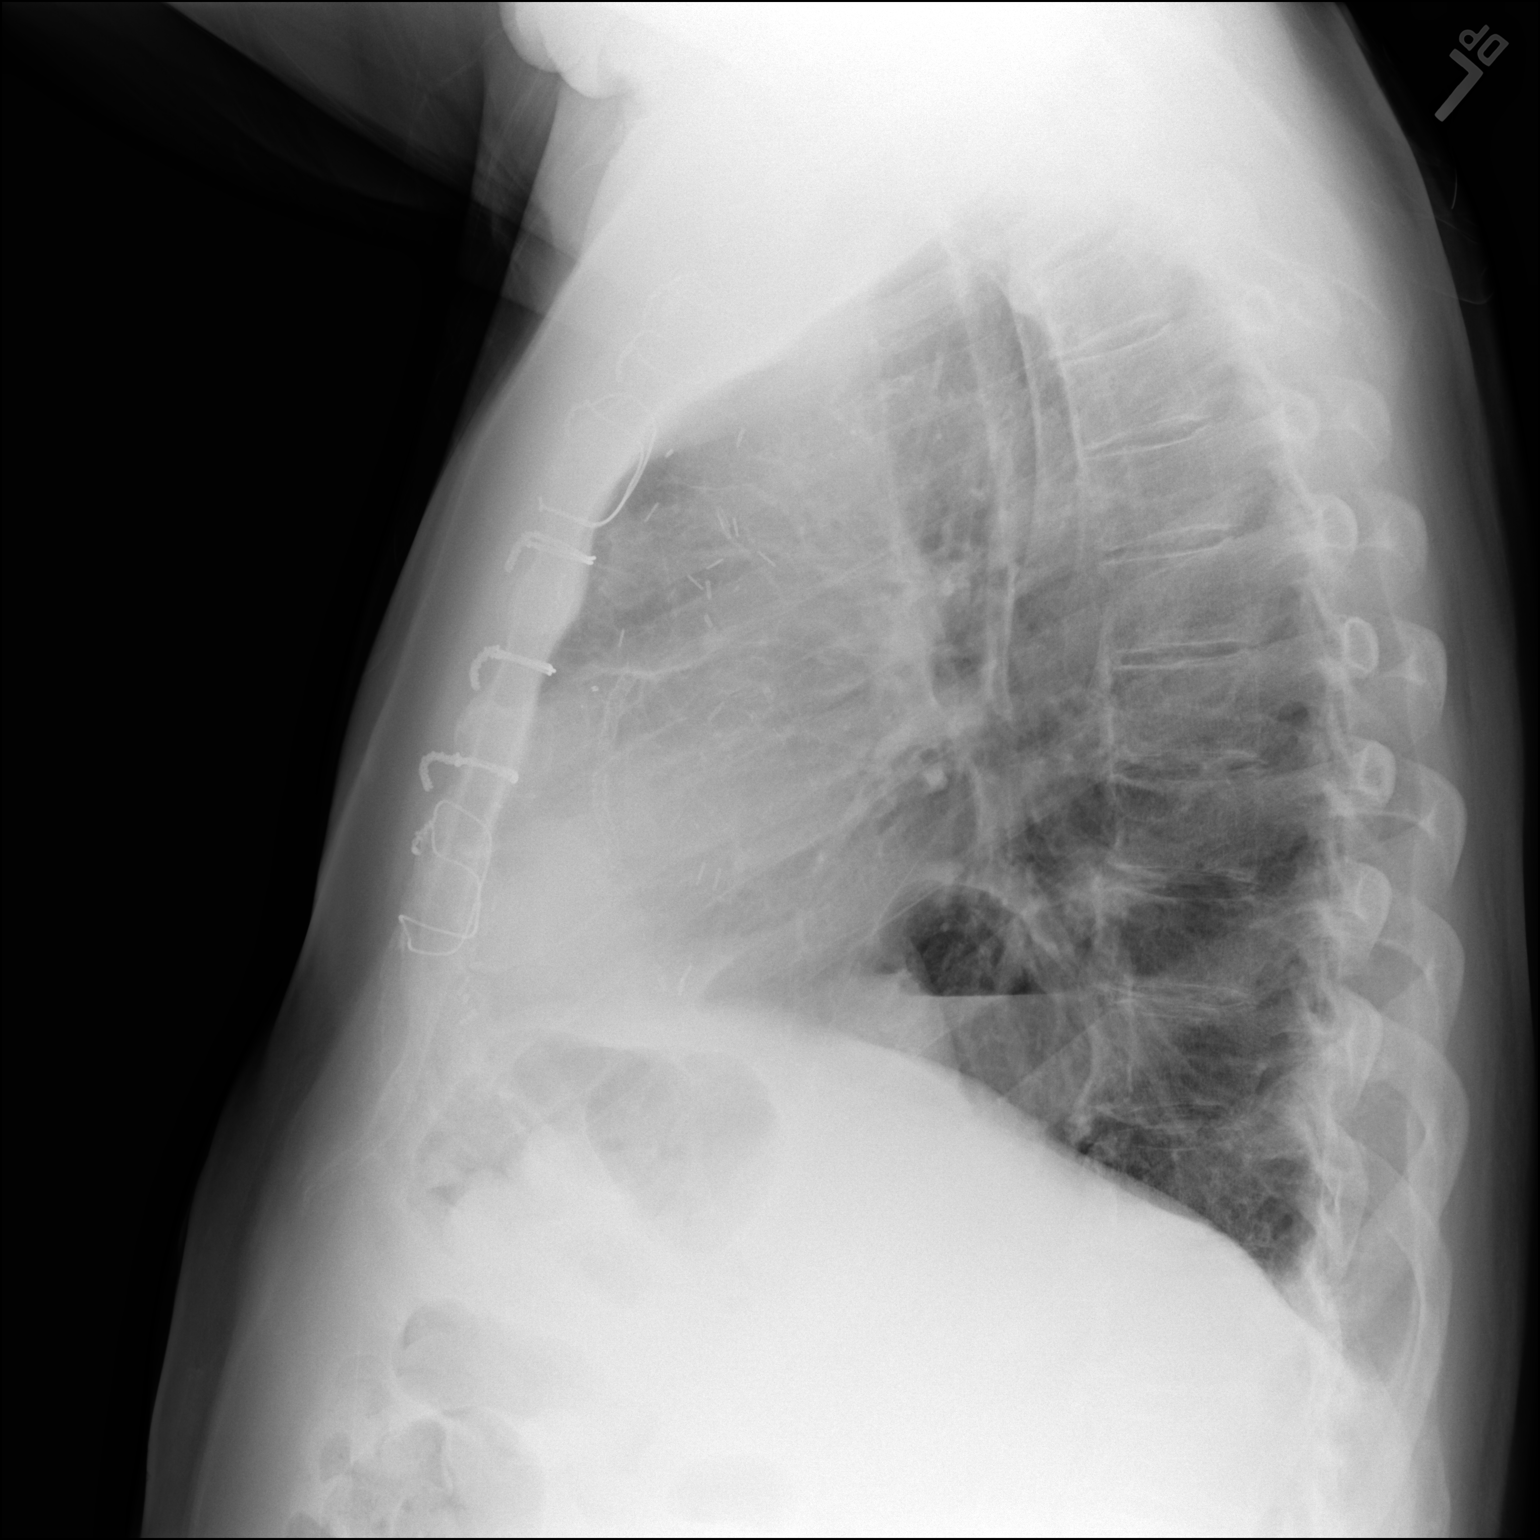

[2 of 2 positions shown; findings below may reference images not displayed]

FINDINGS: Post median sternotomy for CABG.

Cardiomediastinal contours are stable. Signs of hiatal hernia in the
retrocardiac region similar to prior imaging.

Trachea midline.

Hilar structures are unremarkable.

Subtle opacification and partially obscured LEFT hemidiaphragmatic
contour may in part be related to hiatal hernia slight increase
along the lateral margin of the hernia since the prior study.

No effusion.

On limited assessment no acute skeletal process.
IMPRESSION: Signs of hiatal hernia in the retrocardiac region similar to prior
imaging.

LEFT lower lobe airspace disease slightly increased from prior exam
could represent atelectasis or developing infection.

## 2021-11-08 ENCOUNTER — Ambulatory Visit (HOSPITAL_COMMUNITY)
Admission: RE | Admit: 2021-11-08 | Discharge: 2021-11-08 | Disposition: A | Discharge status: Home / Self Care | Payer: Medicare PPO | Source: Ambulatory Visit | Attending: Cardiology | Admitting: Cardiology

## 2021-11-08 ENCOUNTER — Other Ambulatory Visit: Payer: Self-pay

## 2021-11-08 DIAGNOSIS — I6523 Occlusion and stenosis of bilateral carotid arteries: Secondary | ICD-10-CM

## 2021-11-14 ENCOUNTER — Encounter: Payer: Self-pay | Admitting: *Deleted

## 2022-03-08 ENCOUNTER — Other Ambulatory Visit: Payer: Self-pay | Admitting: Student

## 2022-03-10 NOTE — Telephone Encounter (Signed)
This is Dr. Hochrein's pt. °

## 2022-03-26 ENCOUNTER — Other Ambulatory Visit: Payer: Self-pay | Admitting: Cardiology

## 2022-03-26 DIAGNOSIS — E785 Hyperlipidemia, unspecified: Secondary | ICD-10-CM

## 2022-03-28 ENCOUNTER — Encounter: Payer: Self-pay | Admitting: Gastroenterology

## 2022-06-09 ENCOUNTER — Encounter: Payer: Self-pay | Admitting: Gastroenterology

## 2022-06-09 ENCOUNTER — Ambulatory Visit: Payer: Medicare PPO | Admitting: Gastroenterology

## 2022-06-09 VITALS — BP 122/68 | HR 63 | Ht 73.0 in | Wt 236.5 lb

## 2022-06-09 DIAGNOSIS — K51919 Ulcerative colitis, unspecified with unspecified complications: Secondary | ICD-10-CM | POA: Diagnosis not present

## 2022-06-09 NOTE — Progress Notes (Signed)
Chief Complaint: For colon  Referring Provider:  Guadalupe Maple, MD      ASSESSMENT AND PLAN;   #1. 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 Recent TIA/prev CVA as below.   Plan: -Colon after cardio and Neuro clearance and holding plavix 5 days prior (no need to hold ASA).  Would like to wait for colon x at least 3-6 months. He would like to get it done after his marriage [place] late Feb. (tentatively March 2024, earlier if with any problems) -Continue Asacol -D/W Conley Canal (RN)   HPI:    Martin [name] is a 76 y.o. male  With recent TIA, H/O CVA (detected via MRI), B/L carotid artery stenosis on ASA/Plavix (started Jun 05, 2022 by neurology), CAD (nl EF 60-65%), GERD, anxiety/depression, hard of hearing.  Accompanied by his wife Martin [name] (RN)  No nausea, vomiting, heartburn (on omeprazole), regurgitation, odynophagia or dysphagia.  No significant diarrhea or constipation.  No melena or hematochezia. No unintentional weight loss. No abdominal pain.  Got letter to get rpt colon for High Risk screening d/t H/O panUC Dx 1990 on Asacol.  He tried weaning off Asacol in 2018 but had exacerbation of UC.   Previous GI procedures Colonoscopy 10/06/2016: ([facility]) -Colon polyps s/p polypectomy. Bx-hyperplastic -Previously diagnosed UC.  Inactive endoscopically.  However, Bx-chronic inactive colitis.  Past Medical History:  Diagnosis Date   Anxiety and depression    Benign prostatic hypertrophy    Coronary artery disease    cath 04/2007 with 90-95% RCA stenosis. Had DES placed.  Had an acute stent thrombosis in the hospital that day and subsequently had 3 DES placed in the RCA. The LAD had 45% stenosis, in the circ had 35% stenosis. The EF was well preserved.   Dyslipidemia    GERD (gastroesophageal reflux disease)    Hypercholesterolemia    Insomnia    Ulcerative colitis     Past Surgical History:  Procedure Laterality Date   COLONOSCOPY  10/06/2016   Colonic polyps status post  polypectomy. Previously diagnosed left sided ulcerative colitis now wiith only smudged vascular pattern (inactive endoscopically)   CORONARY ARTERY BYPASS GRAFT N/A 11/21/2020   Procedure: CORONARY ARTERY BYPASS GRAFTING (CABG) TIMES FOUR ON PUMP USING BILATERAL INTERNAL MAMMARY ARTERIES AND LEFT RADIAL ARTERY;  Surgeon: Wonda Olds, MD;  Location: [place];  Service: Open Heart Surgery;  Laterality: N/A;  [place]   LEFT HEART CATH AND CORONARY ANGIOGRAPHY N/A 11/20/2020   Procedure: LEFT HEART CATH AND CORONARY ANGIOGRAPHY;  Surgeon: Troy Sine, MD;  Location: [facility];  Service: Cardiovascular;  Laterality: N/A;   RADIAL ARTERY HARVEST Left 11/21/2020   Procedure: RADIAL ARTERY HARVEST;  Surgeon: Wonda Olds, MD;  Location: [facility];  Service: Open Heart Surgery;  Laterality: Left;   RIGHT HEART CATH N/A 12/05/2020   Procedure: RIGHT HEART CATH;  Surgeon: Jolaine Artist, MD;  Location: [place] [facility];  Service: Cardiovascular;  Laterality: N/A;   ROTATOR CUFF REPAIR Left    TEE WITHOUT CARDIOVERSION N/A 11/21/2020   Procedure: TRANSESOPHAGEAL ECHOCARDIOGRAM (TEE);  Surgeon: Wonda Olds, MD;  Location: [facility];  Service: Open Heart Surgery;  Laterality: N/A;    Family History  Problem Relation Age of Onset   Heart attack Mother    Heart attack Father    Heart attack Brother    Colon cancer Neg Hx    Rectal cancer Neg Hx  Stomach cancer Neg Hx     Social History   Tobacco Use   Smoking status: Never   Smokeless tobacco: Never  Vaping Use   Vaping Use: Never used  Substance Use Topics   Alcohol use: Yes    Alcohol/week: 1.0 standard drink of alcohol    Types: 1 Cans of beer per week    Current Outpatient Medications  Medication Sig Dispense Refill   ASACOL HD 800 MG TBEC Take 800 mg by mouth 3 (three) times daily.  3   aspirin 81 MG tablet Take 81 mg by mouth daily.     atorvastatin (LIPITOR) 40 MG tablet Take 1  tablet by mouth daily.     clopidogrel (PLAVIX) 75 MG tablet Take 75 mg by mouth daily.     loratadine (CLARITIN) 10 MG tablet Take 10 mg by mouth daily.     meloxicam (MOBIC) 7.5 MG tablet Take by mouth.     metoprolol succinate (TOPROL-XL) 25 MG 24 hr tablet TAKE 1/2 TABLET BY MOUTH EVERY DAY 45 tablet 3   nitroGLYCERIN (NITROSTAT) 0.4 MG SL tablet Place 0.4 mg under the tongue every 5 (five) minutes as needed for chest pain.     omeprazole (PRILOSEC OTC) 20 MG tablet Take 20 mg by mouth daily.     tamsulosin (FLOMAX) 0.4 MG CAPS capsule Take 0.4 mg by mouth daily.     traZODone (DESYREL) 50 MG tablet Take 50 mg by mouth at bedtime as needed for sleep.     No current facility-administered medications for this visit.    Allergies  Allergen Reactions   Morphine Other (See Comments)    Hot, sweats, flushed    Review of Systems:  Constitutional: Denies fever, chills, diaphoresis, appetite change and fatigue.  HEENT: HOH  Respiratory: Denies SOB, DOE, cough, chest tightness,  and wheezing.   Cardiovascular: Denies chest pain, palpitations and leg swelling.  Genitourinary: Denies dysuria, urgency, frequency, hematuria, flank pain and difficulty urinating.  Musculoskeletal: Denies myalgias, back pain, joint swelling, arthralgias and gait problem.   Neurological: Denies dizziness, seizures, syncope, weakness, light-headedness, numbness and has had headaches.  Hematological: Denies adenopathy. Easy bruising, personal or family bleeding history  Psychiatric/Behavioral: Has anxiety or depression     Physical Exam:    BP 122/68   Pulse 63   Ht 6\' 1"  (1.854 m)   Wt 236 lb 8 oz (107.3 kg)   SpO2 96%   BMI 31.20 kg/m  Wt Readings from Last 3 Encounters:  06/09/22 236 lb 8 oz (107.3 kg)  06/07/21 216 lb (98 kg)  03/05/21 213 lb (96.6 kg)   Constitutional:  Well-developed, in no acute distress. Psychiatric: Normal mood and affect. Behavior is normal. Cardiovascular: Normal rate,  regular rhythm. No edema Pulmonary/chest: Effort normal and breath sounds normal. No wheezing, rales or rhonchi. Abdominal: Soft, nondistended. Nontender. Bowel sounds active throughout. There are no masses palpable. No hepatomegaly. Rectal: Deferred Neurological: Alert and oriented to person place and time. Skin: Skin is warm and dry. No rashes noted.    Carmell Austria, MD 06/09/2022, 9:48 AM  Cc: Guadalupe Maple, MD

## 2022-06-09 NOTE — Patient Instructions (Signed)
_______________________________________________________  If you are age 76 or older, your body mass index should be between 23-30. Your Body mass index is 31.2 kg/m. If this is out of the aforementioned range listed, please consider follow up with your Primary Care Provider.  If you are age 28 or younger, your body mass index should be between 19-25. Your Body mass index is 31.2 kg/m. If this is out of the aformentioned range listed, please consider follow up with your Primary Care Provider.   ________________________________________________________  The [facility] providers would like to encourage you to use [facility] to communicate with providers for non-urgent requests or questions.  Due to long hold times on the telephone, sending your provider a message by [facility] may be a faster and more efficient way to get a response.  Please allow 48 business hours for a response.  Please remember that this is for non-urgent requests.  _______________________________________________________  It has been recommended to you by your physician that you have a(n) colon completed in March 2024 we did not schedule the procedure(s) today. Please contact our office at [phone] in January 2024 to schedule this please.  Thank you,  Dr. Jackquline Denmark

## 2022-07-06 NOTE — Progress Notes (Unsigned)
Cardiology Office Note   Date:  07/09/2022   ID:  Martin [name], DOB [birth year], [name] [id number]  PCP:  [name], MD  Cardiologist:   Rollene Rotunda, MD   Chief Complaint  Patient presents with   Transient Ischemic Attack    History of Present Illness: Martin [name] is a 76 y.o. male who presents for one year follow up of CAD. He had distant multiple stents.  He had increased symptoms and had cath with progressive disease and had CABG x 4.  There was post op atrial flutter and an EF of 45 - 50%.  He had a moderate pericardial effusion which was without tamponade.  He converted spontaneously to NSR but was started on amiodarone.   He had two echoes in April to follow the effusion and the second was stable.  The last EF recorded was 50 - 55%.   The most recent echo in July demonstrated the effusion has resolved.  EF 65%.    Since he was last seen he has been managed for possible TIA.  He came in with some headaches.  He saw neurologist.  MRI of the head showed an old right hemispheric infarct.  He had some known carotid plaque.  He was started on Plavix in addition to his aspirin.  He is not hospitalized.  I reviewed neurology notes for this.  He denies any new cardiovascular symptoms.  He does some walking for exercise.  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  He says he has not been getting any chest pressure, neck or arm discomfort.  He has not been having any palpitations, presyncope or syncope.  He has had no new shortness of breath, PND or orthopnea.   Past Medical History:  Diagnosis Date   Anxiety and depression    Benign prostatic hypertrophy    Coronary artery disease    cath 04/2007 with 90-95% RCA stenosis. Had DES placed.  Had an acute stent thrombosis in the hospital that day and subsequently had 3 DES placed in the RCA. The LAD had 45% stenosis, in the circ had 35% stenosis. The EF was well preserved.   Dyslipidemia    GERD  (gastroesophageal reflux disease)    Hypercholesterolemia    Insomnia    Ulcerative colitis     Past Surgical History:  Procedure Laterality Date   COLONOSCOPY  10/06/2016   Colonic polyps status post polypectomy. Previously diagnosed left sided ulcerative colitis now wiith only smudged vascular pattern (inactive endoscopically)   CORONARY ARTERY BYPASS GRAFT N/A 11/21/2020   Procedure: CORONARY ARTERY BYPASS GRAFTING (CABG) TIMES FOUR ON PUMP USING BILATERAL INTERNAL MAMMARY ARTERIES AND LEFT RADIAL ARTERY;  Surgeon: [name], MD;  Location: [facility];  Service: Open Heart Surgery;  Laterality: N/A;  [facility]   INGUINAL HERNIA REPAIR  1999   LEFT HEART CATH AND CORONARY ANGIOGRAPHY N/A 11/20/2020   Procedure: LEFT HEART CATH AND CORONARY ANGIOGRAPHY;  Surgeon: Lennette Bihari, MD;  Location: [facility];  Service: Cardiovascular;  Laterality: N/A;   RADIAL ARTERY HARVEST Left 11/21/2020   Procedure: RADIAL ARTERY HARVEST;  Surgeon: [name], MD;  Location: [facility];  Service: Open Heart Surgery;  Laterality: Left;   RIGHT HEART CATH N/A 12/05/2020   Procedure: RIGHT HEART CATH;  Surgeon: Dolores Patty, MD;  Location: [facility];  Service: Cardiovascular;  Laterality: N/A;   ROTATOR CUFF REPAIR Left  TEE WITHOUT CARDIOVERSION N/A 11/21/2020   Procedure: TRANSESOPHAGEAL ECHOCARDIOGRAM (TEE);  Surgeon: [name], MD;  Location: [facility];  Service: Open Heart Surgery;  Laterality: N/A;     Current Outpatient Medications  Medication Sig Dispense Refill   ASACOL HD 800 MG TBEC Take 800 mg by mouth 3 (three) times daily.  3   aspirin 81 MG tablet Take 81 mg by mouth daily.     atorvastatin (LIPITOR) 40 MG tablet Take 1 tablet by mouth daily.     clopidogrel (PLAVIX) 75 MG tablet Take 75 mg by mouth daily.     loratadine (CLARITIN) 10 MG tablet Take 10 mg by mouth daily.     meloxicam (MOBIC) 7.5 MG tablet Take by mouth.     metoprolol succinate  (TOPROL-XL) 25 MG 24 hr tablet TAKE 1/2 TABLET BY MOUTH EVERY DAY 45 tablet 3   pantoprazole (PROTONIX) 40 MG tablet Take 40 mg by mouth daily.     tamsulosin (FLOMAX) 0.4 MG CAPS capsule Take 0.4 mg by mouth daily.     traZODone (DESYREL) 50 MG tablet Take 50 mg by mouth at bedtime as needed for sleep.     nitroGLYCERIN (NITROSTAT) 0.4 MG SL tablet Place 0.4 mg under the tongue every 5 (five) minutes as needed for chest pain. (Patient not taking: Reported on 07/08/2022)     omeprazole (PRILOSEC OTC) 20 MG tablet Take 20 mg by mouth daily. (Patient not taking: Reported on 07/08/2022)     No current facility-administered medications for this visit.    Allergies:   Morphine    ROS:  Please see the history of present illness.   Otherwise, review of systems are positive for none.   All other systems are reviewed and negative.    PHYSICAL EXAM: VS:  BP 124/64 (BP Location: Left Arm, Patient Position: Sitting, Cuff Size: Normal)   Pulse 69   Ht 6\' 1"  (1.854 m)   Wt 235 lb 12.8 oz (107 kg)   SpO2 95%   BMI 31.11 kg/m  , BMI Body mass index is 31.11 kg/m. GENERAL:  Well appearing NECK:  No jugular venous distention, waveform within normal limits, carotid upstroke brisk and symmetric, no bruits, no thyromegaly LUNGS:  Clear to auscultation bilaterally CHEST:  Unremarkable HEART:  PMI not displaced or sustained,S1 and S2 within normal limits, no S3, no S4, no clicks, no rubs, no murmurs ABD:  Flat, positive bowel sounds normal in frequency in pitch, no bruits, no rebound, no guarding, no midline pulsatile mass, no hepatomegaly, no splenomegaly EXT:  2 plus pulses throughout, no edema, no cyanosis no clubbing   EKG:  EKG is  ordered today. Sinus rhythm, rate 69, axis within normal limits, intervals within normal limits, no acute ST-T wave changes.    Recent Labs: No results found for requested labs within last 365 days.      Wt Readings from Last 3 Encounters:  07/08/22 235 lb 12.8 oz  (107 kg)  06/09/22 236 lb 8 oz (107.3 kg)  06/07/21 216 lb (98 kg)      Other studies Reviewed: Additional studies/ records that were reviewed today include: Neurology records Review of the above records demonstrates:  Please see elsewhere in the note.     ASSESSMENT AND PLAN:  CAD -  The patient has no new sypmtoms.  No further cardiovascular testing is indicated.  We will continue with aggressive risk reduction and meds as listed.  TIA: I am going to go ahead  and place a 4-week monitor.   He is due to have repeat carotid Doppler but I will go ahead and move this up.  This was due in March.  He had bilateral 40 to 59% stenosis.  DYSLIPIDEMIA -  I do not have a more recent lipid profile.  The most recent LDL I have is 103.  He says there has been a repeat and he will get this for me.  The goal should be an LDL less than 70.  HTN - The blood pressure is at target.  No change in therapy.   ATRIAL FLUTTER:   This initially was only postop and will be assessed as above.  He also has a wearable and we talked about using this.  For now we will continue on Plavix and aspirin.   Current medicines are reviewed at length with the patient today.  The patient does not have concerns regarding medicines.  The following changes have been made:   None  Labs/ tests ordered today include:    Orders Placed This Encounter  Procedures   CARDIAC EVENT MONITOR   EKG 12-Lead    Disposition:   FU with me in 12 months.    Signed, Minus Breeding, MD  07/09/2022 9:10 AM    [place]

## 2022-07-08 ENCOUNTER — Encounter: Payer: Self-pay | Admitting: *Deleted

## 2022-07-08 ENCOUNTER — Ambulatory Visit: Payer: Medicare PPO | Attending: Cardiology | Admitting: Cardiology

## 2022-07-08 ENCOUNTER — Encounter: Payer: Self-pay | Admitting: Cardiology

## 2022-07-08 VITALS — BP 124/64 | HR 69 | Ht 73.0 in | Wt 235.8 lb

## 2022-07-08 DIAGNOSIS — I10 Essential (primary) hypertension: Secondary | ICD-10-CM | POA: Diagnosis not present

## 2022-07-08 DIAGNOSIS — I25119 Atherosclerotic heart disease of native coronary artery with unspecified angina pectoris: Secondary | ICD-10-CM | POA: Diagnosis not present

## 2022-07-08 DIAGNOSIS — E785 Hyperlipidemia, unspecified: Secondary | ICD-10-CM | POA: Diagnosis not present

## 2022-07-08 DIAGNOSIS — I4892 Unspecified atrial flutter: Secondary | ICD-10-CM

## 2022-07-08 NOTE — Progress Notes (Signed)
Patient enrolled for [employer] to ship a 30 day cardiac event monitor to his address on file. 

## 2022-07-08 NOTE — Patient Instructions (Signed)
Medication Instructions:  Your physician recommends that you continue on your current medications as directed. Please refer to the Current Medication list given to you today.  *If you need a refill on your cardiac medications before your next appointment, please call your pharmacy*  Testing/Procedures: Your physician has recommended that you wear an (30 day) event monitor. Event monitors are medical devices that record the heart's electrical activity. Doctors most often [place] these monitors to diagnose arrhythmias. Arrhythmias are problems with the speed or rhythm of the heartbeat. The monitor is a small, portable device. You can wear one while you do your normal daily activities. This is usually used to diagnose what is causing palpitations/syncope (passing out).  Follow-Up: At [facility], you and your health needs are our priority.  As part of our continuing mission to provide you with exceptional heart care, we have created designated Provider Care Teams.  These Care Teams include your primary Cardiologist (physician) and Advanced Practice Providers (APPs -  Physician Assistants and Nurse Practitioners) who all work together to provide you with the care you need, when you need it.  We recommend signing up for the patient portal called "MyChart".  Sign up information is provided on this After Visit Summary.  MyChart is used to connect with patients for Virtual Visits (Telemedicine).  Patients are able to view lab/test results, encounter notes, upcoming appointments, etc.  Non-urgent messages can be sent to your provider as well.   To learn more about what you can do with MyChart, go to [email or web address].    Your next appointment:   12 month(s)  The format for your next appointment:   In Person  Provider:   Minus Breeding, MD     Other Instructions [employer] Cardiac Event Monitor Instructions Your physician has requested you wear your cardiac event monitor for __30__ days,  (1-30). [employer] may call or text to confirm a shipping address. The monitor will be sent to a land address via UPS. [employer] will not ship a monitor to a PO BOX. It typically takes 3-5 days to receive your monitor after it has been enrolled. [employer] will assist with USPS tracking if your package is delayed. The telephone number for [employer] is [phone]. Once you have received your monitor, please review the enclosed instructions. Instruction tutorials can also be viewed under help and settings on the enclosed cell phone. Your monitor has already been registered assigning a specific monitor serial # to you.  Applying the monitor Remove cell phone from case and turn it on. The cell phone works as Dealer and needs to be within [employer] of you at all times. The cell phone will need to be charged on a daily basis. We recommend you plug the cell phone into the enclosed charger at your bedside table every night.  Monitor batteries: You will receive two monitor batteries labelled #1 and #2. These are your recorders. Plug battery #2 onto the second connection on the enclosed charger. Keep one battery on the charger at all times. This will keep the monitor battery deactivated. It will also keep it fully charged for when you need to switch your monitor batteries. A small light will be blinking on the battery emblem when it is charging. The light on the battery emblem will remain on when the battery is fully charged.  Open package of a Monitor strip. Insert battery #1 into black hood on strip and gently squeeze monitor battery onto connection as indicated in instruction booklet. Set aside  while preparing skin.  Choose location for your strip, vertical or horizontal, as indicated in the instruction booklet. Shave to remove all hair from location. There cannot be any lotions, oils, powders, or colognes on skin where monitor is to be applied. Wipe skin clean with enclosed Saline  wipe. Dry skin completely.  Peel paper labeled #1 off the back of the Monitor strip exposing the adhesive. Place the monitor on the chest in the vertical or horizontal position shown in the instruction booklet. One arrow on the monitor strip must be pointing upward. Carefully remove paper labeled #2, attaching remainder of strip to your skin. Try not to create any folds or wrinkles in the strip as you apply it.  Firmly press and release the circle in the center of the monitor battery. You will hear a small beep. This is turning the monitor battery on. The heart emblem on the monitor battery will light up every 5 seconds if the monitor battery in turned on and connected to the patient securely. Do not push and hold the circle down as this turns the monitor battery off. The cell phone will locate the monitor battery. A screen will appear on the cell phone checking the connection of your monitor strip. This may read poor connection initially but change to good connection within the next minute. Once your monitor accepts the connection you will hear a series of 3 beeps followed by a climbing crescendo of beeps. A screen will appear on the cell phone showing the two monitor strip placement options. Touch the picture that demonstrates where you applied the monitor strip.  Your monitor strip and battery are waterproof. You are able to shower, bathe, or swim with the monitor on. They just ask you do not submerge deeper than 3 feet underwater. We recommend removing the monitor if you are swimming in a lake, river, or ocean.  Your monitor battery will need to be switched to a fully charged monitor battery approximately once a week. The cell phone will alert you of an action which needs to be made.  On the cell phone, tap for details to reveal connection status, monitor battery status, and cell phone battery status. The green dots indicates your monitor is in good status. A red dot indicates there is  something that needs your attention.  To record a symptom, click the circle on the monitor battery. In 30-60 seconds a list of symptoms will appear on the cell phone. Select your symptom and tap save. Your monitor will record a sustained or significant arrhythmia regardless of you clicking the button. Some patients do not feel the heart rhythm irregularities. [employer] will notify us of any serious or critical events.  Refer to instruction booklet for instructions on switching batteries, changing strips, the Do not disturb or Pause features, or any additional questions.  Call [employer] at [phone], to confirm your monitor is transmitting and record your baseline. They will answer any questions you may have regarding the monitor instructions at that time.  Returning the monitor to [employer] Place all equipment back into blue box. Peel off strip of paper to expose adhesive and close box securely. There is a prepaid UPS shipping label on this box. Drop in a UPS drop box, or at a UPS facility like [employer]. You may also contact [employer] to arrange UPS to pick up monitor package at your home.

## 2022-07-09 ENCOUNTER — Telehealth: Payer: Self-pay | Admitting: *Deleted

## 2022-07-09 ENCOUNTER — Encounter: Payer: Self-pay | Admitting: Cardiology

## 2022-07-09 DIAGNOSIS — I6523 Occlusion and stenosis of bilateral carotid arteries: Secondary | ICD-10-CM

## 2022-07-09 NOTE — Telephone Encounter (Signed)
Left message for patient to call and schedule carotid dopplers.

## 2022-07-09 NOTE — Telephone Encounter (Signed)
-----   Message from Rollene Rotunda, MD sent at 07/09/2022  9:11 AM EST ----- Can you please move his carotid Doppler up .  It was due in March but I would like to move this up.  Thanks.

## 2022-07-17 ENCOUNTER — Ambulatory Visit (HOSPITAL_COMMUNITY)
Admission: RE | Admit: 2022-07-17 | Discharge: 2022-07-17 | Disposition: A | Discharge status: Home / Self Care | Payer: Medicare PPO | Source: Ambulatory Visit | Attending: Cardiology | Admitting: Cardiology

## 2022-07-17 ENCOUNTER — Ambulatory Visit (INDEPENDENT_AMBULATORY_CARE_PROVIDER_SITE_OTHER): Payer: Medicare PPO

## 2022-07-17 DIAGNOSIS — I4892 Unspecified atrial flutter: Secondary | ICD-10-CM | POA: Diagnosis not present

## 2022-07-17 DIAGNOSIS — I6523 Occlusion and stenosis of bilateral carotid arteries: Secondary | ICD-10-CM | POA: Insufficient documentation

## 2022-07-17 NOTE — Progress Notes (Unsigned)
Applied monitor mailed to patient 07/08/2022.

## 2022-07-28 ENCOUNTER — Encounter: Payer: Self-pay | Admitting: *Deleted

## 2022-09-02 ENCOUNTER — Encounter: Payer: Self-pay | Admitting: *Deleted

## 2022-09-06 ENCOUNTER — Other Ambulatory Visit: Payer: Self-pay | Admitting: Cardiology

## 2022-09-06 DIAGNOSIS — E785 Hyperlipidemia, unspecified: Secondary | ICD-10-CM

## 2022-09-29 ENCOUNTER — Other Ambulatory Visit: Payer: Self-pay | Admitting: Cardiology

## 2022-09-29 DIAGNOSIS — E785 Hyperlipidemia, unspecified: Secondary | ICD-10-CM

## 2022-09-30 ENCOUNTER — Encounter: Payer: Self-pay | Admitting: *Deleted

## 2022-10-02 ENCOUNTER — Telehealth: Payer: Self-pay | Admitting: Cardiology

## 2022-10-02 NOTE — Telephone Encounter (Signed)
Called pt's wife, she is made aware on the medication list he is taking Lipitor. She states "NO he is on Rosuvastatin. I'm filling up his medication box and he has Rosuvastatin. The pharmacy said they faxed in a refill and they said they have not gotten a response." Pt's wife needs to know is the pt supposed to be taking Rosuvastatin or Atorvastatin. Will get message to Dr. Percival Spanish for review.

## 2022-10-02 NOTE — Telephone Encounter (Signed)
Pt c/o medication issue:  1. Name of Medication:   rosuvastatin (CRESTOR) 40 MG tablet   2. How are you currently taking this medication (dosage and times per day)?  Still taking  3. Are you having a reaction (difficulty breathing--STAT)?   4. What is your medication issue?   Wife stated patient is out of this medication and wants to confirm patient should still be taking this medication.

## 2022-10-02 NOTE — Telephone Encounter (Signed)
Id not need this encounter

## 2022-10-03 MED ORDER — ROSUVASTATIN CALCIUM 40 MG PO TABS: 40.0000 mg | ORAL_TABLET | Freq: Every day | ORAL | 3 refills | Status: DC

## 2022-10-03 NOTE — Telephone Encounter (Signed)
Spoke with pt wife, per review of the chart, atorvastatin was stopped and the patient is supposed to be taking crestor. New script sent to the pharmacy

## 2023-02-03 ENCOUNTER — Ambulatory Visit (HOSPITAL_COMMUNITY)
Admission: RE | Admit: 2023-02-03 | Discharge: 2023-02-03 | Disposition: A | Discharge status: Home / Self Care | Payer: Medicare PPO | Source: Ambulatory Visit | Attending: Vascular Surgery | Admitting: Vascular Surgery

## 2023-02-03 ENCOUNTER — Other Ambulatory Visit: Payer: Self-pay | Admitting: *Deleted

## 2023-02-03 DIAGNOSIS — R0989 Other specified symptoms and signs involving the circulatory and respiratory systems: Secondary | ICD-10-CM

## 2023-02-05 ENCOUNTER — Encounter: Payer: Self-pay | Admitting: Surgery

## 2023-02-05 ENCOUNTER — Ambulatory Visit: Payer: Medicare PPO | Admitting: Surgery

## 2023-02-05 VITALS — BP 133/62 | HR 58 | Temp 97.8°F | Resp 16 | Ht 73.0 in | Wt 234.0 lb

## 2023-02-05 DIAGNOSIS — I6523 Occlusion and stenosis of bilateral carotid arteries: Secondary | ICD-10-CM | POA: Diagnosis not present

## 2023-02-05 NOTE — Progress Notes (Signed)
[facility] [place]  Patient name: Martin [name] [name]: [id number] DOB: 1946-05-11 Sex: male   REQUESTING PROVIDER:    Evette Doffing   REASON FOR CONSULT:    TIA  HISTORY OF PRESENT ILLNESS:   Martin [name] is a 77 y.o. male, who is referred for evaluation of disease.  Patient states that since his CABG, he has not had issues with balance.  He does have a MRI from 2023 that shows a moderate-sized chronic right frontal, temporal, and occipital infarct.  He also has a record of having blurry vision and eye issues on the left that could potentially have represented TIAs.  The patient does not remember these and certainly has not had any recently.  He has undergone heart monitor testing which has been unremarkable.  He denies any acute neurologic symptoms.  Specifically, he denies numbness or weakness in either extremity.  He denies slurred speech.  He denies amaurosis fugax.   Patient has a history of coronary artery disease, status post stenting and CABG he is a non-smoker.  He is medically managed for hypertension.  He takes a statin for hypercholesterolemia.  He has ulcerative colitis.  He also complains of some left hip pain.  He walks approximately 01-6999 steps a day PAST MEDICAL HISTORY    Past Medical History:  Diagnosis Date   Anxiety and depression    Benign prostatic hypertrophy    Coronary artery disease    cath 04/2007 with 90-95% RCA stenosis. Had DES placed.  Had an acute stent thrombosis in the hospital that day and subsequently had 3 DES placed in the RCA. The LAD had 45% stenosis, in the circ had 35% stenosis. The EF was well preserved.   Dyslipidemia    GERD (gastroesophageal reflux disease)    Hypercholesterolemia    Insomnia    Ulcerative colitis      FAMILY HISTORY   Family History  Problem Relation Age of Onset   Heart attack Mother    Heart attack Father    Heart attack Brother    Colon cancer Neg Hx     Rectal cancer Neg Hx    Stomach cancer Neg Hx     SOCIAL HISTORY:   Social History   Socioeconomic History   Marital status: Married    Spouse name: [name]   Number of children: 2   Years of education: Not on file   Highest education level: Associate degree: academic program  Occupational History   Occupation: retired    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  Tobacco Use   Smoking status: Never   Smokeless tobacco: Never  Vaping Use   Vaping Use: Never used  Substance and Sexual Activity   Alcohol use: Yes    Alcohol/week: 1.0 standard drink of alcohol    Types: 1 Cans of beer per week   Drug use: Not on file   Sexual activity: Not Currently  Other Topics Concern   Not on file  Social History Narrative   Not on file   Social Determinants of Health   Financial Resource Strain: Low Risk  (12/06/2020)   Overall Financial Resource Strain (CARDIA)    Difficulty of Paying Living Expenses: Not hard at all  Food Insecurity: No Food Insecurity (12/06/2020)   Hunger Vital Sign    Worried About Running Out of Food in the Last Year: Never true    Martin Out of Food in the Last Year: Never true  Transportation Needs:  No Transportation Needs (12/06/2020)   PRAPARE - Administrator, Civil Service (Medical): No    Lack of Transportation (Non-Medical): No  Physical Activity: Insufficiently Active (12/06/2020)   Exercise Vital Sign    Days of Exercise per Week: 3 days    Minutes of Exercise per Session: 30 min  Stress: Not on file  Social Connections: Not on file  Intimate Partner Violence: Not on file    ALLERGIES:    Allergies  Allergen Reactions   Morphine Other (See Comments)    Hot, sweats, flushed    CURRENT MEDICATIONS:    Current Outpatient Medications  Medication Sig Dispense Refill   ASACOL HD 800 MG TBEC Take 800 mg by mouth 3 (three) times daily.  3   aspirin 81 MG tablet Take 81 mg by mouth daily.     clopidogrel (PLAVIX) 75 MG tablet Take  75 mg by mouth daily.     loratadine (CLARITIN) 10 MG tablet Take 10 mg by mouth daily.     meloxicam (MOBIC) 7.5 MG tablet Take by mouth.     metoprolol succinate (TOPROL-XL) 25 MG 24 hr tablet TAKE 1/2 TABLET BY MOUTH EVERY DAY 45 tablet 3   pantoprazole (PROTONIX) 40 MG tablet Take 40 mg by mouth daily.     rosuvastatin (CRESTOR) 40 MG tablet Take 1 tablet (40 mg total) by mouth daily. 90 tablet 3   tamsulosin (FLOMAX) 0.4 MG CAPS capsule Take 0.4 mg by mouth daily.     traZODone (DESYREL) 50 MG tablet Take 50 mg by mouth at bedtime as needed for sleep.     nitroGLYCERIN (NITROSTAT) 0.4 MG SL tablet Place 0.4 mg under the tongue every 5 (five) minutes as needed for chest pain. (Patient not taking: Reported on 07/08/2022)     omeprazole (PRILOSEC OTC) 20 MG tablet Take 20 mg by mouth daily. (Patient not taking: Reported on 07/08/2022)     No current facility-administered medications for this visit.    REVIEW OF SYSTEMS:   [X]  denotes positive finding, [ ]  denotes negative finding Cardiac  Comments:  Chest pain or chest pressure:    Shortness of breath upon exertion:    Short of breath when lying flat:    Irregular heart rhythm:        Vascular    Pain in calf, thigh, or hip brought on by ambulation:    Pain in feet at night that wakes you up from your sleep:     Blood clot in your veins:    Leg swelling:         Pulmonary    Oxygen at home:    Productive cough:     Wheezing:         Neurologic    Sudden weakness in arms or legs:     Sudden numbness in arms or legs:     Sudden onset of difficulty speaking or slurred speech:    Temporary loss of vision in one eye:     Problems with dizziness:         Gastrointestinal    Blood in stool:      Vomited blood:         Genitourinary    Burning when urinating:     Blood in urine:        Psychiatric    Major depression:         Hematologic    Bleeding problems:    Problems with blood clotting too  easily:        Skin     Rashes or ulcers:        Constitutional    Fever or chills:     PHYSICAL EXAM:   Vitals:   02/05/23 1049 02/05/23 1052  BP: 137/66 133/62  Pulse: (!) 50 (!) 58  Resp: 16   Temp: 97.8 F (36.6 C)   TempSrc: Temporal   SpO2: 97%   Weight: 234 lb (106.1 kg)   Height: 6\' 1"  (1.854 m)     GENERAL: The patient is a well-nourished male, in no acute distress. The vital signs are documented above. CARDIAC: There is a regular rate and rhythm.  VASCULAR: Palpable femoral pulses bilaterally PULMONARY: Nonlabored respirations MUSCULOSKELETAL: There are no major deformities or cyanosis. NEUROLOGIC: No focal weakness or paresthesias are detected. SKIN: There are no ulcers or rashes noted. PSYCHIATRIC: The patient has a normal affect.  STUDIES:   I have reviewed the following duplex ultrasound: Right Carotid: Velocities in the right ICA are consistent with a 60-79%                 stenosis. Non-hemodynamically significant plaque <50% noted  in                the CCA. The ECA appears >50% stenosed.   Left Carotid: Velocities in the left ICA are consistent with a 60-79%  stenosis.               Non-hemodynamically significant plaque <50% noted in the  CCA. The                ECA appears >50% stenosed.   Vertebrals:  Bilateral vertebral arteries demonstrate antegrade flow.  Subclavians: Right subclavian artery was stenotic. Normal flow  hemodynamics were               seen in the left subclavian artery.   ASSESSMENT and PLAN   Carotid disease: The patient has bilateral 60-79% stenosis.  He does have a history of stroke by MRI.  It sounds like this could have been around the time of his cardiac surgery as he has been having issues with balance since that time.  He denies having any new acute neurologic symptoms.  I discussed that as long as he remains asymptomatic, and has stenosis less than 80%, he should be treated medically.  I would reserve endarterectomy/stenting for acute  symptoms or progression of disease greater than 80%.  I will follow him on a biannual basis with surveillance imaging.  I will see him back in 6 months.   Charlena Cross, MD, FACS [facility] [phone] Pager [phone]

## 2023-02-09 ENCOUNTER — Other Ambulatory Visit: Payer: Self-pay | Admitting: *Deleted

## 2023-02-09 DIAGNOSIS — I6523 Occlusion and stenosis of bilateral carotid arteries: Secondary | ICD-10-CM

## 2023-05-25 ENCOUNTER — Telehealth: Payer: Self-pay | Admitting: Cardiology

## 2023-05-25 MED ORDER — NITROGLYCERIN 0.4 MG SL SUBL: 0.4000 mg | SUBLINGUAL_TABLET | SUBLINGUAL | 3 refills | Status: DC | PRN

## 2023-05-25 NOTE — Telephone Encounter (Signed)
*  STAT* If patient is at the pharmacy, call can be transferred to refill team.   1. Which medications need to be refilled? (please list name of each medication and dose if known)   nitroGLYCERIN (NITROSTAT) 0.4 MG SL tablet  Place 0.4 mg under the tongue every 5 (five) minutes as needed for chest pain    2. Would you like to learn more about the convenience, safety, & potential cost savings by using the [facility]? No   3. Are you open to using the [facility] No  4. Which pharmacy/location (including street and city if local pharmacy) is medication to be sent to?[id number] - [place],  - [address]    5. Do they need a 30 day or 90 day supply? 25 Tablets

## 2023-07-06 ENCOUNTER — Telehealth: Payer: Self-pay | Admitting: Gastroenterology

## 2023-07-06 NOTE — Telephone Encounter (Signed)
Inbound call from patient's wife stating patient has been having rectal bleeding and abdominal pain. States patient is over due for a colonoscopy. Patient's wife requesting a call to be advised further. Please advise, thank you.

## 2023-07-07 NOTE — Telephone Encounter (Signed)
Pt wife stated that she pt had one episode of some rectal bleeding yesterday along with abdominal pain and one episode of emesis.  Pt had been constipated and had a large BM associated with rectal bleeding. Pt has had 2 BM since then with no bleeding. Pt feels that he is good this AM. Pt wife stated that he is overdue for a colonoscopy. Pt is being seen by cardiology and is on a blood thinner. Pt was scheduled to see Dr. Chales Abrahams on 07/21/2023 at 8:50 AM. Pt wife made aware. Pt wife verbalized understanding with all questions answered.

## 2023-07-08 LAB — LAB REPORT - SCANNED
A1c: 6
EGFR: 48

## 2023-07-12 NOTE — Progress Notes (Unsigned)
Cardiology Office Note:   Date:  07/15/2023  ID:  Martin [name], DOB 16-Nov-1945, [name] [id number] PCP: [name], MD  [facility] Providers Cardiologist:  Rollene Rotunda, MD {  History of Present Illness:   Martin [name] is a 77 y.o. male who presents for one year follow up of CAD. He had distant multiple stents.  He had increased symptoms and had cath with progressive disease and had CABG x 4.  There was post op atrial flutter and an EF of 45 - 50%.  He had a moderate pericardial effusion which was without tamponade.  He converted spontaneously to NSR but was started on amiodarone.   He had two echoes in April to follow the effusion and the second was stable.  The last EF recorded was 50 - 55%.   The most recent echo in July demonstrated the effusion has resolved.  EF 65%.     Since he was last seen he has done okay.  He has had some problems with balance.  He does not have the stamina he used to have but he is not having any any acute shortness of breath he was having prior to his bypass.  He just got a new house to do the single level living and he is redoing some of the cabinets. The patient denies any new symptoms such as chest discomfort, neck or arm discomfort. There has been no new shortness of breath, PND or orthopnea. There have been no reported palpitations, presyncope or syncope.   He does have some vague leg weakness and balance issues at times.  ROS: As stated in the HPI and negative for all other systems.  Studies Reviewed:    EKG:   EKG Interpretation Date/Time:  Wednesday July 15 2023 12:14:47 EST Ventricular Rate:  63 PR Interval:  224 QRS Duration:  98 QT Interval:  448 QTC Calculation: 458 R Axis:   80  Text Interpretation: Sinus rhythm with 1st degree A-V block with Premature supraventricular complexes When compared with ECG of 05-Dec-2020 03:34, atrial flutter is no longer present Confirmed by Rollene Rotunda ([id number]) on 07/15/2023 12:52:01 PM      Risk Assessment/Calculations:              Physical Exam:   VS:  BP 132/64 (BP Location: Left Arm, Patient Position: Sitting, Cuff Size: Normal)   Pulse 63   Ht 6\' 1"  (1.854 m)   Wt 235 lb (106.6 kg)   SpO2 97%   BMI 31.00 kg/m    Wt Readings from Last 3 Encounters:  07/15/23 235 lb (106.6 kg)  02/05/23 234 lb (106.1 kg)  07/08/22 235 lb 12.8 oz (107 kg)     GEN: Well nourished, well developed in no acute distress NECK: No JVD; No carotid bruits CARDIAC: RRR, no murmurs, rubs, gallops RESPIRATORY:  Clear to auscultation without rales, wheezing or rhonchi  ABDOMEN: Soft, non-tender, non-distended EXTREMITIES: Mild leg edema; No deformity, diminished dorsalis pedis and posttibial's bilaterally  ASSESSMENT AND PLAN:   CAD -  The patient has no new sypmtoms.  No further cardiovascular testing is indicated.  We will continue with aggressive risk reduction and meds as listed.     Carotid stenosis- Has bilateral 60 to 79% stenosis and is being followed by Dr. Myra Gianotti   DYSLIPIDEMIA -  He says his lipids were okay at his primary care office.  I want the LDL to be 50 and I will get a copy of these and adjust  meds as necessary.   HTN - The blood pressure is at target.  No change in therapy.   Leg discomfort - He has some vague leg discomfort and diminished pulses and I have sent a message to Dr. Myra Gianotti to see if they can check his ABIs when they see him back for his carotids.  Follow up with me in one year  Signed, Rollene Rotunda, MD

## 2023-07-15 ENCOUNTER — Encounter: Payer: Self-pay | Admitting: Cardiology

## 2023-07-15 ENCOUNTER — Telehealth: Payer: Self-pay | Admitting: Emergency Medicine

## 2023-07-15 ENCOUNTER — Ambulatory Visit: Payer: Medicare PPO | Attending: Cardiology | Admitting: Cardiology

## 2023-07-15 ENCOUNTER — Encounter: Payer: Self-pay | Admitting: Gastroenterology

## 2023-07-15 VITALS — BP 132/64 | HR 63 | Ht 73.0 in | Wt 235.0 lb

## 2023-07-15 DIAGNOSIS — E785 Hyperlipidemia, unspecified: Secondary | ICD-10-CM

## 2023-07-15 DIAGNOSIS — I4892 Unspecified atrial flutter: Secondary | ICD-10-CM | POA: Diagnosis not present

## 2023-07-15 DIAGNOSIS — I10 Essential (primary) hypertension: Secondary | ICD-10-CM | POA: Diagnosis not present

## 2023-07-15 DIAGNOSIS — I25119 Atherosclerotic heart disease of native coronary artery with unspecified angina pectoris: Secondary | ICD-10-CM | POA: Diagnosis not present

## 2023-07-15 NOTE — Patient Instructions (Signed)
Medication Instructions:  Stop Aspirin *If you need a refill on your cardiac medications before your next appointment, please call your pharmacy*  Follow-Up: At [facility], you and your health needs are our priority.  As part of our continuing mission to provide you with exceptional heart care, we have created designated Provider Care Teams.  These Care Teams include your primary Cardiologist (physician) and Advanced Practice Providers (APPs -  Physician Assistants and Nurse Practitioners) who all work together to provide you with the care you need, when you need it.  We recommend signing up for the patient portal called "MyChart".  Sign up information is provided on this After Visit Summary.  MyChart is used to connect with patients for Virtual Visits (Telemedicine).  Patients are able to view lab/test results, encounter notes, upcoming appointments, etc.  Non-urgent messages can be sent to your provider as well.   To learn more about what you can do with MyChart, go to [email or web address].    Your next appointment:   1 year(s)  Provider:   Rollene Rotunda, MD

## 2023-07-15 NOTE — Telephone Encounter (Signed)
Sent fax requesting most recent lipid panel

## 2023-07-21 ENCOUNTER — Ambulatory Visit: Payer: Medicare PPO | Admitting: Gastroenterology

## 2023-07-21 ENCOUNTER — Telehealth: Payer: Self-pay | Admitting: Gastroenterology

## 2023-07-21 ENCOUNTER — Encounter: Payer: Self-pay | Admitting: Gastroenterology

## 2023-07-21 VITALS — BP 100/70 | HR 50 | Ht 73.0 in | Wt 236.1 lb

## 2023-07-21 DIAGNOSIS — Z8719 Personal history of other diseases of the digestive system: Secondary | ICD-10-CM

## 2023-07-21 DIAGNOSIS — K5909 Other constipation: Secondary | ICD-10-CM

## 2023-07-21 DIAGNOSIS — Z87898 Personal history of other specified conditions: Secondary | ICD-10-CM

## 2023-07-21 MED ORDER — ONDANSETRON 4 MG PO TBDP: 4.0000 mg | ORAL_TABLET | Freq: Three times a day (TID) | ORAL | 2 refills | Status: DC | PRN

## 2023-07-21 NOTE — Telephone Encounter (Signed)
Please give Zofran 4mg  ODT every 8 hours as needed #20, 2RF RG

## 2023-07-21 NOTE — Telephone Encounter (Signed)
Please advise. Are you willing to write a script? If so dosage quantity and refills please

## 2023-07-21 NOTE — Telephone Encounter (Signed)
Done

## 2023-07-21 NOTE — Telephone Encounter (Signed)
Inbound call from patient's wife, calling requesting for Zofran to be called into [facility] in [place] for patient.

## 2023-07-21 NOTE — Patient Instructions (Addendum)
_______________________________________________________  If your blood pressure at your visit was 140/90 or greater, please contact your primary care physician to follow up on this.  _______________________________________________________  If you are age 77 or older, your body mass index should be between 23-30. Your Body mass index is 31.15 kg/m. If this is out of the aforementioned range listed, please consider follow up with your Primary Care Provider.  If you are age 22 or younger, your body mass index should be between 19-25. Your Body mass index is 31.15 kg/m. If this is out of the aformentioned range listed, please consider follow up with your Primary Care Provider.   ________________________________________________________  The [place] [facility] providers would like to encourage you to use [facility] to communicate with providers for non-urgent requests or questions.  Due to long hold times on the telephone, sending your provider a message by [facility] may be a faster and more efficient way to get a response.  Please allow 48 business hours for a response.  Please remember that this is for non-urgent requests.  _______________________________________________________  Continue medications  You will be contacted by our office prior to your procedure for directions on holding your Plavix.  If you do not hear from our office 2 week prior to your scheduled procedure, please call [phone] to discuss.   You have been scheduled for an endoscopy and colonoscopy. Please follow the written instructions given to you at your visit today.  Please pick up your prep supplies at the pharmacy within the next 1-3 days.  If you use inhalers (even only as needed), please bring them with you on the day of your procedure.  DO NOT TAKE 7 DAYS PRIOR TO TEST- Trulicity (dulaglutide) Ozempic, Wegovy (semaglutide) Mounjaro (tirzepatide) Bydureon Bcise (exanatide extended release)  DO NOT TAKE 1 DAY PRIOR TO  YOUR TEST Rybelsus (semaglutide) Adlyxin (lixisenatide) Victoza (liraglutide) Byetta (exanatide) ___________________________________________________________________________  Thank you,  Dr. Lynann Bologna

## 2023-07-21 NOTE — Progress Notes (Signed)
Chief Complaint: For colon  Referring Provider:  [name], MD      ASSESSMENT AND PLAN;   #1. H/O N/V- resolved  #2. Chronic constipation. 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 H/O CAD, TIA/prev CVA on ASA/plavix.   Plan: -EGD/Colon after cardio and Neuro clearance (Dr Charyl Dancer) and holding plavix 5 days prior (no need to hold ASA).  -Continue pantaprazole 40 mg p.o. daily -Continue Asacol for now -If still with problems, CT AP with contrast -Avoid stool softeners.  If any constipation, please take MiraLAX. -D/W Milbert Coulter (RN)   I discussed EGD/Colonoscopy- the indications, risks, alternatives and potential complications including, but not limited to bleeding, infection, reaction to meds, damage to internal organs, cardiac and/or pulmonary problems, and perforation requiring surgery. The possibility that significant findings could be missed was explained. All ? were answered. Pt consents to proceed. HPI:    Martin [name] is a 77 y.o. male  With recent TIA, H/O CVA (detected via MRI), B/L carotid artery stenosis on ASA/Plavix (started Jun 05, 2022 by neurology), CAD s/p CABG (EF 65%), GERD, HTN, HLD, anxiety/depression, hard of hearing, peripheral arterial disease.  Accompanied by his wife Bradin Hardin (RN)  3 episodes of upper abdominal pain associated with nausea/vomiting (July- sept), none since Was constipated and took stool softeners before 2 of the above events. No heartburn as long as he takes Protonix. No odynophagia or dysphagia.  Has been more constipated lately.  No further abdominal pain.  Denies having any rectal bleeding.  Compliant with Asacol for remote history of ulcerative colitis in 1990s  Willing to undergo EGD/colonoscopy Would definitely need neuro/cardio clearance to hold Plavix 5 days prior.  He also got a letter saying that he is due for repeat colonoscopy due to history of pan ulcerative colitis.  Previous GI procedures Colonoscopy 10/06/2016:  ([facility]) -Colon polyps s/p polypectomy. Bx-hyperplastic -Previously diagnosed UC.  Inactive endoscopically.  However, Bx-chronic inactive colitis.  Past Medical History:  Diagnosis Date   Anxiety and depression    Benign prostatic hypertrophy    Coronary artery disease    cath 04/2007 with 90-95% RCA stenosis. Had DES placed.  Had an acute stent thrombosis in the hospital that day and subsequently had 3 DES placed in the RCA. The LAD had 45% stenosis, in the circ had 35% stenosis. The EF was well preserved.   Dyslipidemia    GERD (gastroesophageal reflux disease)    Hypercholesterolemia    Insomnia    Ulcerative colitis     Past Surgical History:  Procedure Laterality Date   COLONOSCOPY  10/06/2016   Colonic polyps status post polypectomy. Previously diagnosed left sided ulcerative colitis now wiith only smudged vascular pattern (inactive endoscopically)   CORONARY ARTERY BYPASS GRAFT N/A 11/21/2020   Procedure: CORONARY ARTERY BYPASS GRAFTING (CABG) TIMES FOUR ON PUMP USING BILATERAL INTERNAL MAMMARY ARTERIES AND LEFT RADIAL ARTERY;  Surgeon: [name], MD;  Location: [facility] OR;  Service: Open Heart Surgery;  Laterality: N/A;  BIMA   INGUINAL HERNIA REPAIR  1999   LEFT HEART CATH AND CORONARY ANGIOGRAPHY N/A 11/20/2020   Procedure: LEFT HEART CATH AND CORONARY ANGIOGRAPHY;  Surgeon: Lennette Bihari, MD;  Location: [facility] INVASIVE CV LAB;  Service: Cardiovascular;  Laterality: N/A;   RADIAL ARTERY HARVEST Left 11/21/2020   Procedure: RADIAL ARTERY HARVEST;  Surgeon: [name], MD;  Location: [facility] OR;  Service: Open Heart Surgery;  Laterality: Left;   RIGHT HEART CATH N/A 12/05/2020  Procedure: RIGHT HEART CATH;  Surgeon: Dolores Patty, MD;  Location: [facility] INVASIVE CV LAB;  Service: Cardiovascular;  Laterality: N/A;   ROTATOR CUFF REPAIR Left    TEE WITHOUT CARDIOVERSION N/A 11/21/2020   Procedure: TRANSESOPHAGEAL ECHOCARDIOGRAM (TEE);  Surgeon: [name], MD;  Location:  [facility];  Service: Open Heart Surgery;  Laterality: N/A;    Family History  Problem Relation Age of Onset   Heart attack Mother    Heart attack Father    Heart attack Brother    Colon cancer Neg Hx    Rectal cancer Neg Hx    Stomach cancer Neg Hx     Social History   Tobacco Use   Smoking status: Never   Smokeless tobacco: Never  Vaping Use   Vaping status: Never Used  Substance Use Topics   Alcohol use: Yes    Alcohol/week: 1.0 standard drink of alcohol    Types: 1 Cans of beer per week   Drug use: Never    Current Outpatient Medications  Medication Sig Dispense Refill   ASACOL HD 800 MG TBEC Take 800 mg by mouth 3 (three) times daily.  3   clopidogrel (PLAVIX) 75 MG tablet Take 75 mg by mouth daily.     loratadine (CLARITIN) 10 MG tablet Take 10 mg by mouth daily.     meloxicam (MOBIC) 7.5 MG tablet Take by mouth.     metoprolol succinate (TOPROL-XL) 25 MG 24 hr tablet TAKE 1/2 TABLET BY MOUTH EVERY DAY 45 tablet 3   nitroGLYCERIN (NITROSTAT) 0.4 MG SL tablet Place 1 tablet (0.4 mg total) under the tongue every 5 (five) minutes as needed for chest pain. 25 tablet 3   omeprazole (PRILOSEC OTC) 20 MG tablet Take 20 mg by mouth daily.     pantoprazole (PROTONIX) 40 MG tablet Take 40 mg by mouth daily.     rosuvastatin (CRESTOR) 40 MG tablet Take 1 tablet (40 mg total) by mouth daily. 90 tablet 3   tamsulosin (FLOMAX) 0.4 MG CAPS capsule Take 0.4 mg by mouth daily.     traZODone (DESYREL) 50 MG tablet Take 50 mg by mouth at bedtime as needed for sleep.     No current facility-administered medications for this visit.    Allergies  Allergen Reactions   Morphine Other (See Comments)    Hot, sweats, flushed    Review of Systems:  Constitutional: Denies fever, chills, diaphoresis, appetite change and fatigue.  HEENT: HOH  Respiratory: Denies SOB, DOE, cough, chest tightness,  and wheezing.   Cardiovascular: Denies chest pain, palpitations and leg swelling.   Genitourinary: Denies dysuria, urgency, frequency, hematuria, flank pain and difficulty urinating.  Musculoskeletal: Denies myalgias, back pain, joint swelling, arthralgias and gait problem.   Neurological: Denies dizziness, seizures, syncope, weakness, light-headedness, has numbness and has had headaches.  Hematological: Denies adenopathy. Easy bruising, personal or family bleeding history  Psychiatric/Behavioral: Has anxiety or depression     Physical Exam:    BP 100/70 (BP Location: Left Arm, Patient Position: Sitting, Cuff Size: Normal)   Pulse (!) 50   Ht 6\' 1"  (1.854 m)   Wt 236 lb 2 oz (107.1 kg)   SpO2 94%   BMI 31.15 kg/m  Wt Readings from Last 3 Encounters:  07/21/23 236 lb 2 oz (107.1 kg)  07/15/23 235 lb (106.6 kg)  02/05/23 234 lb (106.1 kg)   Constitutional:  Well-developed, in no acute distress. Psychiatric: Normal mood and affect. Behavior is normal. Cardiovascular: Normal  rate, regular rhythm. No edema Pulmonary/chest: Effort normal and breath sounds normal. No wheezing, rales or rhonchi. Abdominal: Soft, nondistended. Nontender. Bowel sounds active throughout. There are no masses palpable. No hepatomegaly. Rectal: Deferred Neurological: Alert and oriented to person place and time. Skin: Skin is warm and dry. No rashes noted.    Edman Circle, MD 07/21/2023, 9:01 AM  Cc: [name], MD

## 2023-07-23 ENCOUNTER — Encounter (HOSPITAL_COMMUNITY): Payer: Self-pay

## 2023-07-23 ENCOUNTER — Other Ambulatory Visit: Payer: Self-pay

## 2023-07-23 ENCOUNTER — Telehealth: Payer: Self-pay

## 2023-07-23 ENCOUNTER — Emergency Department (HOSPITAL_COMMUNITY): Payer: Medicare PPO

## 2023-07-23 ENCOUNTER — Observation Stay (HOSPITAL_COMMUNITY)
Admission: EM | Admit: 2023-07-23 | Discharge: 2023-07-25 | Disposition: A | Discharge status: Home / Self Care | Payer: Medicare PPO | Attending: Internal Medicine | Admitting: Internal Medicine

## 2023-07-23 DIAGNOSIS — D649 Anemia, unspecified: Secondary | ICD-10-CM | POA: Diagnosis present

## 2023-07-23 DIAGNOSIS — R1013 Epigastric pain: Principal | ICD-10-CM | POA: Insufficient documentation

## 2023-07-23 DIAGNOSIS — R0789 Other chest pain: Secondary | ICD-10-CM | POA: Diagnosis not present

## 2023-07-23 DIAGNOSIS — K8020 Calculus of gallbladder without cholecystitis without obstruction: Secondary | ICD-10-CM | POA: Diagnosis present

## 2023-07-23 DIAGNOSIS — E785 Hyperlipidemia, unspecified: Secondary | ICD-10-CM | POA: Diagnosis present

## 2023-07-23 DIAGNOSIS — R933 Abnormal findings on diagnostic imaging of other parts of digestive tract: Secondary | ICD-10-CM | POA: Diagnosis not present

## 2023-07-23 DIAGNOSIS — Z951 Presence of aortocoronary bypass graft: Secondary | ICD-10-CM

## 2023-07-23 DIAGNOSIS — Z7902 Long term (current) use of antithrombotics/antiplatelets: Secondary | ICD-10-CM | POA: Diagnosis not present

## 2023-07-23 DIAGNOSIS — I10 Essential (primary) hypertension: Secondary | ICD-10-CM | POA: Diagnosis not present

## 2023-07-23 DIAGNOSIS — R072 Precordial pain: Secondary | ICD-10-CM | POA: Diagnosis not present

## 2023-07-23 DIAGNOSIS — K449 Diaphragmatic hernia without obstruction or gangrene: Secondary | ICD-10-CM | POA: Diagnosis not present

## 2023-07-23 DIAGNOSIS — K2270 Barrett's esophagus without dysplasia: Secondary | ICD-10-CM | POA: Diagnosis not present

## 2023-07-23 DIAGNOSIS — R7989 Other specified abnormal findings of blood chemistry: Secondary | ICD-10-CM | POA: Diagnosis present

## 2023-07-23 DIAGNOSIS — N289 Disorder of kidney and ureter, unspecified: Secondary | ICD-10-CM

## 2023-07-23 DIAGNOSIS — R112 Nausea with vomiting, unspecified: Secondary | ICD-10-CM | POA: Diagnosis present

## 2023-07-23 DIAGNOSIS — I2510 Atherosclerotic heart disease of native coronary artery without angina pectoris: Secondary | ICD-10-CM | POA: Insufficient documentation

## 2023-07-23 DIAGNOSIS — R1012 Left upper quadrant pain: Secondary | ICD-10-CM

## 2023-07-23 DIAGNOSIS — Z8719 Personal history of other diseases of the digestive system: Secondary | ICD-10-CM

## 2023-07-23 DIAGNOSIS — N400 Enlarged prostate without lower urinary tract symptoms: Secondary | ICD-10-CM | POA: Diagnosis present

## 2023-07-23 DIAGNOSIS — Z79899 Other long term (current) drug therapy: Secondary | ICD-10-CM | POA: Insufficient documentation

## 2023-07-23 LAB — URINALYSIS, ROUTINE W REFLEX MICROSCOPIC
Bilirubin Urine: NEGATIVE
Glucose, UA: NEGATIVE mg/dL
Hgb urine dipstick: NEGATIVE
Ketones, ur: NEGATIVE mg/dL
Leukocytes,Ua: NEGATIVE
Nitrite: NEGATIVE
Protein, ur: 30 mg/dL — AB
Specific Gravity, Urine: 1.028 (ref 1.005–1.030)
pH: 5 (ref 5.0–8.0)

## 2023-07-23 LAB — COMPREHENSIVE METABOLIC PANEL
ALT: 21 U/L (ref 0–44)
AST: 20 U/L (ref 15–41)
Albumin: 3.5 g/dL (ref 3.5–5.0)
Alkaline Phosphatase: 77 U/L (ref 38–126)
Anion gap: 9 (ref 5–15)
BUN: 20 mg/dL (ref 8–23)
CO2: 27 mmol/L (ref 22–32)
Calcium: 9.6 mg/dL (ref 8.9–10.3)
Chloride: 104 mmol/L (ref 98–111)
Creatinine, Ser: 1.42 mg/dL — ABNORMAL HIGH (ref 0.61–1.24)
GFR, Estimated: 51 mL/min — ABNORMAL LOW (ref >60)
Glucose, Bld: 108 mg/dL — ABNORMAL HIGH (ref 70–99)
Potassium: 4.5 mmol/L (ref 3.5–5.1)
Sodium: 140 mmol/L (ref 135–145)
Total Bilirubin: 1.6 mg/dL — ABNORMAL HIGH (ref <1.2)
Total Protein: 7.2 g/dL (ref 6.5–8.1)

## 2023-07-23 LAB — CBC
HCT: 39.9 % (ref 39.0–52.0)
Hemoglobin: 12.4 g/dL — ABNORMAL LOW (ref 13.0–17.0)
MCH: 30.1 pg (ref 26.0–34.0)
MCHC: 31.1 g/dL (ref 30.0–36.0)
MCV: 96.8 fL (ref 80.0–100.0)
Platelets: 278 10*3/uL (ref 150–400)
RBC: 4.12 MIL/uL — ABNORMAL LOW (ref 4.22–5.81)
RDW: 13 % (ref 11.5–15.5)
WBC: 9.7 10*3/uL (ref 4.0–10.5)
nRBC: 0 % (ref 0.0–0.2)

## 2023-07-23 LAB — TROPONIN I (HIGH SENSITIVITY)
Troponin I (High Sensitivity): 31 ng/L — ABNORMAL HIGH (ref <18)
Troponin I (High Sensitivity): 33 ng/L — ABNORMAL HIGH (ref <18)

## 2023-07-23 LAB — LIPASE, BLOOD: Lipase: 44 U/L (ref 11–51)

## 2023-07-23 NOTE — ED Triage Notes (Signed)
Pt came in via POV d/t abd pain with n/v after he eats for the last 3 days. Endorses that the pain feels like it radiates up into his Lt shoulder. A/Ox4, Does feel some pain in his chest as well, rates his pain 7/10.

## 2023-07-23 NOTE — ED Provider Notes (Signed)
[facility]ERGENCY DEPARTMENT AT [facility] Provider Note   CSN: [id number] Arrival date & time: 07/23/23  1736     History  Chief Complaint  Patient presents with   Abdominal Pain   Chest Pain   Nausea   Emesis    Martin [name] is a 77 y.o. male.  The history is provided by the patient, a relative, the spouse and medical records.  Abdominal Pain Associated symptoms: chest pain and vomiting   Chest Pain Associated symptoms: abdominal pain and vomiting   Emesis Associated symptoms: abdominal pain   Martin [name] is a 77 y.o. male who presents to the Emergency Department complaining of chest pain.  She presents to the emergency department for evaluation of intermittent chest pain that started 3 weeks ago.  Initially his pain was once a week with nausea, bloating and indigestion when he ate that would feel better after he vomited.  Then for the last 3 days he developed severe pain in his left upper quadrant that radiates to his left chest, back whenever he eats.  He gets associated nausea, bloating.  After vomiting he gets immediate relief.  His chest pain is described as dull and intense at the same time and constant nature.  It does ease off with vomiting.  He does have associated constipation with last BM this morning.  No fever, shortness of breath.  He reports several months of lower extremity edema.  He is status post CABG in 2022.  He is seen by both cardiology and GI.      Home Medications Prior to Admission medications   Medication Sig Start Date End Date Taking? Authorizing Provider  ASACOL HD 800 MG TBEC Take 800 mg by mouth 3 (three) times daily. 07/09/15  Yes [provider]  clopidogrel (PLAVIX) 75 MG tablet Take 75 mg by mouth daily. 05/23/22  Yes [provider]  loratadine (CLARITIN) 10 MG tablet Take 10 mg by mouth daily.   Yes [provider]  metoprolol succinate (TOPROL-XL) 25 MG 24 hr tablet TAKE 1/2 TABLET BY MOUTH  EVERY DAY 03/10/22  Yes Rollene Rotunda, MD  nitroGLYCERIN (NITROSTAT) 0.4 MG SL tablet Place 1 tablet (0.4 mg total) under the tongue every 5 (five) minutes as needed for chest pain. 05/25/23  Yes Rollene Rotunda, MD  pantoprazole (PROTONIX) 40 MG tablet Take 40 mg by mouth daily. 06/04/22  Yes [provider]  rosuvastatin (CRESTOR) 40 MG tablet Take 1 tablet (40 mg total) by mouth daily. 10/03/22  Yes Rollene Rotunda, MD  tamsulosin (FLOMAX) 0.4 MG CAPS capsule Take 0.4 mg by mouth daily.   Yes [provider]  traZODone (DESYREL) 50 MG tablet Take 50 mg by mouth at bedtime as needed for sleep. 07/25/20  Yes [provider]  ondansetron (ZOFRAN-ODT) 4 MG disintegrating tablet Take 1 tablet (4 mg total) by mouth every 8 (eight) hours as needed for nausea or vomiting. Patient not taking: Reported on 07/24/2023 07/21/23   Lynann Bologna, MD      Allergies    Morphine    Review of Systems   Review of Systems  Cardiovascular:  Positive for chest pain.  Gastrointestinal:  Positive for abdominal pain and vomiting.  All other systems reviewed and are negative.   Physical Exam Updated Vital Signs BP 138/70 (BP Location: Right Arm)   Pulse 69   Temp 97.9 F (36.6 C) (Oral)   Resp 12   Ht 6\' 1"  (1.854 m)  Wt 106.6 kg   SpO2 94%   BMI 31.00 kg/m  Physical Exam Vitals and nursing note reviewed.  Constitutional:      Appearance: He is well-developed.  HENT:     Head: Normocephalic and atraumatic.  Cardiovascular:     Rate and Rhythm: Normal rate and regular rhythm.     Heart sounds: No murmur heard. Pulmonary:     Effort: Pulmonary effort is normal. No respiratory distress.     Breath sounds: Normal breath sounds.  Abdominal:     Palpations: Abdomen is soft.     Tenderness: There is no guarding or rebound.     Comments: Mild upper abdominal tenderness  Musculoskeletal:        General: No tenderness.     Comments: Trace edema to BLE  Skin:    General:  Skin is warm and dry.  Neurological:     Mental Status: He is alert and oriented to person, place, and time.  Psychiatric:        Behavior: Behavior normal.     ED Results / Procedures / Treatments   Labs (all labs ordered are listed, but only abnormal results are displayed) Labs Reviewed  COMPREHENSIVE METABOLIC PANEL - Abnormal; Notable for the following components:      Result Value   Glucose, Bld 108 (*)    Creatinine, Ser 1.42 (*)    Total Bilirubin 1.6 (*)    GFR, Estimated 51 (*)    All other components within normal limits  CBC - Abnormal; Notable for the following components:   RBC 4.12 (*)    Hemoglobin 12.4 (*)    All other components within normal limits  URINALYSIS, ROUTINE W REFLEX MICROSCOPIC - Abnormal; Notable for the following components:   Color, Urine AMBER (*)    APPearance HAZY (*)    Protein, ur 30 (*)    Bacteria, UA RARE (*)    All other components within normal limits  TROPONIN I (HIGH SENSITIVITY) - Abnormal; Notable for the following components:   Troponin I (High Sensitivity) 31 (*)    All other components within normal limits  TROPONIN I (HIGH SENSITIVITY) - Abnormal; Notable for the following components:   Troponin I (High Sensitivity) 33 (*)    All other components within normal limits  TROPONIN I (HIGH SENSITIVITY) - Abnormal; Notable for the following components:   Troponin I (High Sensitivity) 28 (*)    All other components within normal limits  LIPASE, BLOOD    EKG EKG Interpretation Date/Time:  Thursday July 23 2023 18:00:23 EST Ventricular Rate:  86 PR Interval:  160 QRS Duration:  96 QT Interval:  360 QTC Calculation: 430 R Axis:   100  Text Interpretation: Normal sinus rhythm Rightward axis T wave abnormality, consider inferior ischemia Abnormal ECG Confirmed by Tilden Fossa [phone]) on 07/23/2023 11:10:10 PM  Radiology US Abdomen Limited RUQ (LIVER/GB)  Result Date: 07/24/2023 CLINICAL DATA:  Right upper quadrant  pain EXAM: ULTRASOUND ABDOMEN LIMITED RIGHT UPPER QUADRANT COMPARISON:  None Available. FINDINGS: Gallbladder: Gallbladder is well distended. Multiple small gallstones are noted. No wall thickening or pericholecystic fluid is noted. Negative sonographic Murphy's sign is elicited. Common bile duct: Diameter: 4.1 mm. Liver: No focal lesion identified. Within normal limits in parenchymal echogenicity. Portal vein is patent on color Doppler imaging with normal direction of blood flow towards the liver. Other: None. IMPRESSION: Cholelithiasis without complicating factors. Electronically Signed   By: Alcide Clever M.D.   On: 07/24/2023 03:15  CT Angio Chest/Abd/Pel for Dissection W and/or W/WO  Result Date: 07/24/2023 CLINICAL DATA:  Acute aortic syndrome suspected. Abdominal pain with nausea and vomiting after eating radiating to left shoulder. Chest wall pain. EXAM: CT ANGIOGRAPHY CHEST, ABDOMEN AND PELVIS TECHNIQUE: Non-contrast CT of the chest was initially obtained. Multidetector CT imaging through the chest, abdomen and pelvis was performed using the standard protocol during bolus administration of intravenous contrast. Multiplanar reconstructed images and MIPs were obtained and reviewed to evaluate the vascular anatomy. RADIATION DOSE REDUCTION: This exam was performed according to the departmental dose-optimization program which includes automated exposure control, adjustment of the mA and/or kV according to patient size and/or use of iterative reconstruction technique. CONTRAST:  75mL OMNIPAQUE IOHEXOL 350 MG/ML SOLN COMPARISON:  12/05/2020, 12/10/2021. FINDINGS: CTA CHEST FINDINGS Cardiovascular: The heart is normal in size and there is no pericardial effusion. Multi-vessel coronary artery calcifications are noted. There is atherosclerotic calcification of the aorta without evidence of aneurysm or dissection. The pulmonary trunk is normal in caliber. 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 Mediastinum/Nodes: No enlarged mediastinal, hilar, or axillary lymph nodes. Thyroid gland, trachea, and esophagus demonstrate no significant findings. There is a large hiatal hernia on the left with the stomach intrathoracic in location. Lungs/Pleura: Paraseptal and centrilobular emphysematous changes are noted in the lungs. Pleural and parenchymal scarring is present at the lung apices. Atelectasis is present at the lung bases. No effusion or pneumothorax. Musculoskeletal: Sternotomy wires are noted. Degenerative changes are present in the thoracic spine. Review of the MIP images confirms the above findings. CTA ABDOMEN AND PELVIS FINDINGS VASCULAR Aorta: Normal caliber aorta without aneurysm, dissection, vasculitis or significant stenosis. Aortic atherosclerosis. Celiac: Patent without evidence of aneurysm, dissection, vasculitis or significant stenosis. SMA: Patent without evidence of aneurysm, dissection, vasculitis or significant stenosis. Renals: Both renal arteries are patent without evidence of aneurysm, dissection, vasculitis, fibromuscular dysplasia. There is moderate stenosis involving the left renal ostia secondary to atherosclerotic calcification. No significant stenosis is seen on the right. IMA: Patent. Inflow: Patent without evidence of aneurysm, dissection, vasculitis or significant stenosis. Veins: No obvious venous abnormality within the limitations of this arterial phase study. Review of the MIP images confirms the above findings. NON-VASCULAR Hepatobiliary: No focal liver abnormality is seen. A few stones are present within the gallbladder. No biliary ductal dilatation. Pancreas: Unremarkable. No pancreatic ductal dilatation or surrounding inflammatory changes. Spleen: Normal in size without focal abnormality. Adrenals/Urinary Tract: The adrenal glands are within normal limits. The kidneys enhance symmetrically. Hypodensities are present in the left kidney which are too small to further  characterize. No renal calculus or hydronephrosis. The bladder is unremarkable. Stomach/Bowel: Large hiatal hernia with the stomach intrathoracic in location on the left. No bowel obstruction, free air, or pneumatosis. A focal bowel wall thickening or surrounding inflammatory changes. Appendix appears normal. Lymphatic: No abdominal or pelvic lymphadenopathy. Reproductive: Brachytherapy seeds are noted in the prostate gland. Other: No abdominopelvic ascites. Musculoskeletal: Degenerative changes are present in the lumbar spine. No acute osseous abnormality. Review of the MIP images confirms the above findings. IMPRESSION: 1. Atherosclerotic calcification with no evidence of aneurysm or dissection. 2. Large hiatal hernia with the stomach intrathoracic in location on the left. 3. Cholelithiasis. 4. Emphysema. Electronically Signed   By: Thornell Sartorius M.D.   On: 07/24/2023 00:46   DG Chest 2 View  Result Date: 07/23/2023 CLINICAL DATA:  Chest pain EXAM: CHEST - 2 VIEW COMPARISON:  12/20/2020 FINDINGS: Cardiac shadow is stable.  Large hiatal hernia is again identified. Postsurgical changes are seen. The lungs are well aerated bilaterally. No focal infiltrate or sizable effusion is noted. IMPRESSION: Stable hiatal hernia.  No acute abnormality seen. Electronically Signed   By: Alcide Clever M.D.   On: 07/23/2023 19:11    Procedures Procedures    Medications Ordered in ED Medications  iohexol (OMNIPAQUE) 350 MG/ML injection 75 mL (75 mLs Intravenous Contrast Given 07/24/23 0025)  pantoprazole (PROTONIX) injection 40 mg (40 mg Intravenous Given 07/24/23 0347)  sucralfate (CARAFATE) tablet 1 g (1 g Oral Given 07/24/23 0346)    ED Course/ Medical Decision Making/ A&P                                 Medical Decision Making Amount and/or Complexity of Data Reviewed Labs: ordered. Radiology: ordered.  Risk Prescription drug management. Decision regarding hospitalization.   Patient with history of  coronary artery disease status post CABG here for evaluation of abdominal pain, chest pain, nausea and vomiting.  He does have mild tenderness on examination without peritoneal findings.  His EKG is slightly changed from priors although he has had multiple prior morphologies in the past.  Troponins are elevated but flat.  CTA dissection study was obtained, which is negative for dissection but does demonstrate a large hiatal hernia, cholelithiasis.  Right upper quadrant ultrasound without any evidence of cholecystitis.  Discussed with cardiology fellow, given elevated troponins, change in EKG-recommendation for admission for observation to the medicine service.  Discussed with medicine service regarding admission for observation.  Patient treated with PPI, Carafate for possible gastritis/reflux in setting of his hiatal hernia.        Final Clinical Impression(s) / ED Diagnoses Final diagnoses:  Precordial pain  Nausea and vomiting, unspecified vomiting type  Left upper quadrant abdominal pain    Rx / DC Orders ED Discharge Orders     None         Tilden Fossa, MD 07/24/23 [phone]

## 2023-07-23 NOTE — Telephone Encounter (Signed)
Patient made aware to hold Plavix 5 days prior and voiced understanding. Paper sent to be scanned   Dr Charyl Dancer said cleared to hold Plavix 5 days prior to the procedure

## 2023-07-24 ENCOUNTER — Encounter (HOSPITAL_COMMUNITY): Payer: Self-pay | Admitting: Internal Medicine

## 2023-07-24 ENCOUNTER — Emergency Department (HOSPITAL_COMMUNITY): Payer: Medicare PPO

## 2023-07-24 ENCOUNTER — Ambulatory Visit (HOSPITAL_COMMUNITY): Payer: Medicare PPO

## 2023-07-24 DIAGNOSIS — D649 Anemia, unspecified: Secondary | ICD-10-CM | POA: Diagnosis present

## 2023-07-24 DIAGNOSIS — R1013 Epigastric pain: Secondary | ICD-10-CM | POA: Diagnosis present

## 2023-07-24 DIAGNOSIS — R112 Nausea with vomiting, unspecified: Secondary | ICD-10-CM | POA: Diagnosis not present

## 2023-07-24 DIAGNOSIS — I2510 Atherosclerotic heart disease of native coronary artery without angina pectoris: Secondary | ICD-10-CM | POA: Diagnosis not present

## 2023-07-24 DIAGNOSIS — Z951 Presence of aortocoronary bypass graft: Secondary | ICD-10-CM | POA: Diagnosis not present

## 2023-07-24 DIAGNOSIS — R1012 Left upper quadrant pain: Secondary | ICD-10-CM

## 2023-07-24 DIAGNOSIS — R0789 Other chest pain: Secondary | ICD-10-CM | POA: Diagnosis present

## 2023-07-24 DIAGNOSIS — E785 Hyperlipidemia, unspecified: Secondary | ICD-10-CM

## 2023-07-24 DIAGNOSIS — I10 Essential (primary) hypertension: Secondary | ICD-10-CM

## 2023-07-24 DIAGNOSIS — Z8719 Personal history of other diseases of the digestive system: Secondary | ICD-10-CM

## 2023-07-24 DIAGNOSIS — R7989 Other specified abnormal findings of blood chemistry: Secondary | ICD-10-CM | POA: Diagnosis present

## 2023-07-24 DIAGNOSIS — I25118 Atherosclerotic heart disease of native coronary artery with other forms of angina pectoris: Secondary | ICD-10-CM

## 2023-07-24 DIAGNOSIS — N400 Enlarged prostate without lower urinary tract symptoms: Secondary | ICD-10-CM | POA: Diagnosis present

## 2023-07-24 DIAGNOSIS — K449 Diaphragmatic hernia without obstruction or gangrene: Secondary | ICD-10-CM

## 2023-07-24 DIAGNOSIS — N289 Disorder of kidney and ureter, unspecified: Secondary | ICD-10-CM

## 2023-07-24 DIAGNOSIS — K8020 Calculus of gallbladder without cholecystitis without obstruction: Secondary | ICD-10-CM | POA: Diagnosis present

## 2023-07-24 LAB — HEMOGLOBIN A1C
Hgb A1c MFr Bld: 6.2 % — ABNORMAL HIGH (ref 4.8–5.6)
Mean Plasma Glucose: 131.24 mg/dL

## 2023-07-24 LAB — TROPONIN I (HIGH SENSITIVITY): Troponin I (High Sensitivity): 28 ng/L — ABNORMAL HIGH (ref <18)

## 2023-07-24 MED ORDER — ONDANSETRON HCL 4 MG/2ML IJ SOLN: 4.0000 mg | Freq: Four times a day (QID) | INTRAMUSCULAR | Status: DC | PRN

## 2023-07-24 MED ORDER — IOHEXOL 350 MG/ML SOLN
75.0000 mL | Freq: Once | INTRAVENOUS | Status: AC | PRN
  Administered 2023-07-24: 75 mL via INTRAVENOUS

## 2023-07-24 MED ORDER — ROSUVASTATIN CALCIUM 20 MG PO TABS
40.0000 mg | ORAL_TABLET | Freq: Every day | ORAL | Status: DC
  Administered 2023-07-24 – 2023-07-25 (×2): 40 mg via ORAL
  Filled 2023-07-24 (×2): qty 2

## 2023-07-24 MED ORDER — SUCRALFATE 1 GM/10ML PO SUSP
1.0000 g | Freq: Three times a day (TID) | ORAL | Status: DC
  Administered 2023-07-24 (×3): 1 g via ORAL
  Filled 2023-07-24 (×4): qty 10

## 2023-07-24 MED ORDER — ACETAMINOPHEN 650 MG RE SUPP: 650.0000 mg | Freq: Four times a day (QID) | RECTAL | Status: DC | PRN

## 2023-07-24 MED ORDER — ENOXAPARIN SODIUM 40 MG/0.4ML IJ SOSY: 40.0000 mg | PREFILLED_SYRINGE | INTRAMUSCULAR | Status: DC

## 2023-07-24 MED ORDER — MESALAMINE 400 MG PO CPDR
800.0000 mg | DELAYED_RELEASE_CAPSULE | Freq: Three times a day (TID) | ORAL | Status: DC
  Administered 2023-07-24 – 2023-07-25 (×4): 800 mg via ORAL
  Filled 2023-07-24 (×6): qty 2

## 2023-07-24 MED ORDER — METOPROLOL SUCCINATE ER 25 MG PO TB24
12.5000 mg | ORAL_TABLET | Freq: Every day | ORAL | Status: DC
  Administered 2023-07-24 – 2023-07-25 (×2): 12.5 mg via ORAL
  Filled 2023-07-24 (×2): qty 1

## 2023-07-24 MED ORDER — TAMSULOSIN HCL 0.4 MG PO CAPS
0.4000 mg | ORAL_CAPSULE | Freq: Every day | ORAL | Status: DC
  Administered 2023-07-24 – 2023-07-25 (×2): 0.4 mg via ORAL
  Filled 2023-07-24 (×2): qty 1

## 2023-07-24 MED ORDER — INFLUENZA VAC A&B SURF ANT ADJ 0.5 ML IM SUSY
0.5000 mL | PREFILLED_SYRINGE | INTRAMUSCULAR | Status: DC
  Filled 2023-07-24: qty 0.5

## 2023-07-24 MED ORDER — CLOPIDOGREL BISULFATE 75 MG PO TABS: 75.0000 mg | ORAL_TABLET | Freq: Every day | ORAL | Status: DC

## 2023-07-24 MED ORDER — SODIUM CHLORIDE 0.9% FLUSH
3.0000 mL | Freq: Two times a day (BID) | INTRAVENOUS | Status: DC
  Administered 2023-07-24: 3 mL via INTRAVENOUS

## 2023-07-24 MED ORDER — TRAZODONE HCL 50 MG PO TABS: 50.0000 mg | ORAL_TABLET | Freq: Every evening | ORAL | Status: DC | PRN

## 2023-07-24 MED ORDER — ALBUTEROL SULFATE (2.5 MG/3ML) 0.083% IN NEBU: 2.5000 mg | INHALATION_SOLUTION | Freq: Four times a day (QID) | RESPIRATORY_TRACT | Status: DC | PRN

## 2023-07-24 MED ORDER — LORATADINE 10 MG PO TABS
10.0000 mg | ORAL_TABLET | Freq: Every day | ORAL | Status: DC
  Administered 2023-07-24 – 2023-07-25 (×2): 10 mg via ORAL
  Filled 2023-07-24 (×2): qty 1

## 2023-07-24 MED ORDER — PANTOPRAZOLE SODIUM 40 MG IV SOLR
40.0000 mg | Freq: Once | INTRAVENOUS | Status: AC
Start: 2023-07-24 — End: 2023-07-24
  Administered 2023-07-24: 40 mg via INTRAVENOUS
  Filled 2023-07-24: qty 10

## 2023-07-24 MED ORDER — ENOXAPARIN SODIUM 40 MG/0.4ML IJ SOSY
40.0000 mg | PREFILLED_SYRINGE | INTRAMUSCULAR | Status: DC
  Administered 2023-07-24: 40 mg via SUBCUTANEOUS
  Filled 2023-07-24: qty 0.4

## 2023-07-24 MED ORDER — PANTOPRAZOLE SODIUM 40 MG PO TBEC
40.0000 mg | DELAYED_RELEASE_TABLET | Freq: Two times a day (BID) | ORAL | Status: DC
  Administered 2023-07-24 – 2023-07-25 (×3): 40 mg via ORAL
  Filled 2023-07-24 (×3): qty 1

## 2023-07-24 MED ORDER — ACETAMINOPHEN 325 MG PO TABS: 650.0000 mg | ORAL_TABLET | Freq: Four times a day (QID) | ORAL | Status: DC | PRN

## 2023-07-24 MED ORDER — SUCRALFATE 1 G PO TABS
1.0000 g | ORAL_TABLET | Freq: Once | ORAL | Status: AC
  Administered 2023-07-24: 1 g via ORAL
  Filled 2023-07-24: qty 1

## 2023-07-24 MED ORDER — ONDANSETRON HCL 4 MG PO TABS: 4.0000 mg | ORAL_TABLET | Freq: Four times a day (QID) | ORAL | Status: DC | PRN

## 2023-07-24 NOTE — H&P (View-Only) (Signed)
Attending physician's note   I have taken a history, reviewed the chart, and examined the patient. I performed a substantive portion of this encounter, including complete performance of at least one of the key components, in conjunction with the APP. I agree with the APP's note, impression, and recommendations with my edits.   77 year old male with medical history as outlined below, to include history of CAD s/p CABG (on ASA/Plavix), TIA, CVA, carotid stenosis, admitted with episodic LUQ pain radiating into the chest with associated nausea/vomiting.  Pain tends to be postprandial and immediately resolves with nausea/vomiting.  Independent of food types.  Symptoms have been worsening over the last few days which prompted him to seek care in the ER.  Was evaluated for the symptoms by Dr. Chales Abrahams on 07/21/2023, and had planned on EGD/colonoscopy as outpatient pending cardiology and neurology clearance.  Admission evaluation notable for the following: - CT C/A/P angiography: Large hiatal hernia, cholelithiasis without cholecystitis, emphysema, and no vascular changes/extravasation - RUQ [place]: Cholelithiasis without cholecystitis - H/H 12.4/40 (at baseline) - Renal function at baseline with BUN/creatinine 20/1.4 - Troponin 31 -->  33  --> 28  Was evaluated by the inpatient Cardiology service.  EKG w/o new ischemic changes and symptoms not felt to be ACS  TTE pending.  1) Large hiatal hernia 2) Epigastric pain/Noncardiac chest pain 3) Nausea/Vomiting - Plan for EGD tomorrow to evaluate for medical/luminal pathology, evaluate hernia size and any associated gastritis, Cameron's lesions, etc. - N.p.o. at midnight - Holding Plavix - Explained that diagnostic EGD along with biopsies can be done despite no Plavix washout, but would probably hold off on any polypectomy or other more invasive intervention  4) CAD 5) History of CABG - Evaluated by inpatient Cardiology service - Holding Plavix  6)  Dysphagia Patient did mention some intermittent trouble swallowing, pointing to his lower sternal border.  Unclear if this is food retained in the hernia or if he truly has area of luminal narrowing.  Will evaluate the EGD and perform esophageal dilation as appropriate.  The indications, risks, and benefits of EGD were explained to the patient in detail. Risks include but are not limited to bleeding, perforation, adverse reaction to medications, and cardiopulmonary compromise. Sequelae include but are not limited to the possibility of surgery, hospitalization, and mortality. The patient verbalized understanding and wished to proceed.    [address], DO, [place] [phone] office         Consultation  Referring Provider:TRH Katrinka Blazing Primary Care Physician:  [name], MD Primary Gastroenterologist:  Dr. Chales Abrahams  Reason for Consultation: Recurrent episodes of upper abdominal pain radiating to the chest, nausea and vomiting immediately postprandially  HPI: Martin [name] is a 76 y.o. male with history of coronary artery disease, status post CABG x 4 in 2022, history of TIA, on chronic aspirin and Plavix Also with history of BPH, hyperlipidemia, GERD, and mild left-sided ulcerative colitis. He was admitted yesterday after an episode of intense left upper abdominal pain radiating up into his chest and then into his left shoulder which occurred immediately after eating and was associated nausea and vomiting. He says this episode lasted for 3 to 4 hours which was longer than any of his previous episodes but again alleviated by vomiting. He has had similar episodes over the past 3 to 4 months and his wife says that the episodes have been increasing in frequency just over the past couple of weeks.  He denies any trouble with dysphagia or  odynophagia, feels fine between these episodes has not been having any difficulty eating or nausea.  No changes in bowel habits, no melena or hematochezia. He  presented to the ER yesterday and CT angio chest abdomen and pelvis does show significant coronary atherosclerotic changes, no aneurysm or dissection, large hiatal hernia with intrathoracic stomach on the left, cholelithiasis and COPD mesenteric vessels patent  Abdominal ultrasound-audible gallstones no wall thickening or pericholecystic fluid, CBD 4 mm, liver unremarkable  Labs yesterday with lipase 44 WBC 9.7/hemoglobin 12.4/hematocrit 39.9/MCV 96 BUN 20/creatinine 1.42 LFTs normal other than T. bili of 1.6 Troponins 30> 33  He was evaluated by cardiology earlier today and his symptoms are not felt consistent with angina EKG did not show any new ischemic changes Echo is pending  Patient had been seen just earlier this week by Dr. Chales Abrahams in the office, and plans were to do EGD and colonoscopy off Plavix.  He had not had a colonoscopy for many years and has remote history of UC for which he had remained on Asacol.  He is feeling much better today, has not had any episodes but has only been on liquids, is hungry and asking for food.  No prior abdominal surgery    Past Medical History:  Diagnosis Date   Anxiety and depression    Benign prostatic hypertrophy    Coronary artery disease    cath 04/2007 with 90-95% RCA stenosis. Had DES placed.  Had an acute stent thrombosis in the hospital that day and subsequently had 3 DES placed in the RCA. The LAD had 45% stenosis, in the circ had 35% stenosis. The EF was well preserved.   Dyslipidemia    GERD (gastroesophageal reflux disease)    Hypercholesterolemia    Insomnia    Ulcerative colitis     Past Surgical History:  Procedure Laterality Date   COLONOSCOPY  10/06/2016   Colonic polyps status post polypectomy. Previously diagnosed left sided ulcerative colitis now wiith only smudged vascular pattern (inactive endoscopically)   CORONARY ARTERY BYPASS GRAFT N/A 11/21/2020   Procedure: CORONARY ARTERY BYPASS GRAFTING (CABG) TIMES FOUR ON  PUMP USING BILATERAL INTERNAL MAMMARY ARTERIES AND LEFT RADIAL ARTERY;  Surgeon: [name], MD;  Location: [facility] OR;  Service: Open Heart Surgery;  Laterality: N/A;  BIMA   INGUINAL HERNIA REPAIR  1999   LEFT HEART CATH AND CORONARY ANGIOGRAPHY N/A 11/20/2020   Procedure: LEFT HEART CATH AND CORONARY ANGIOGRAPHY;  Surgeon: Lennette Bihari, MD;  Location: [facility] INVASIVE CV LAB;  Service: Cardiovascular;  Laterality: N/A;   RADIAL ARTERY HARVEST Left 11/21/2020   Procedure: RADIAL ARTERY HARVEST;  Surgeon: [name], MD;  Location: [facility] OR;  Service: Open Heart Surgery;  Laterality: Left;   RIGHT HEART CATH N/A 12/05/2020   Procedure: RIGHT HEART CATH;  Surgeon: Dolores Patty, MD;  Location: [facility] INVASIVE CV LAB;  Service: Cardiovascular;  Laterality: N/A;   ROTATOR CUFF REPAIR Left    TEE WITHOUT CARDIOVERSION N/A 11/21/2020   Procedure: TRANSESOPHAGEAL ECHOCARDIOGRAM (TEE);  Surgeon: [name], MD;  Location[facility];  Service: Open Heart Surgery;  Laterality: N/A;    Prior to Admission medications   Medication Sig Start Date End Date Taking? Authorizing Provider  ASACOL HD 800 MG TBEC Take 800 mg by mouth 3 (three) times daily. 07/09/15  Yes [provider]  clopidogrel (PLAVIX) 75 MG tablet Take 75 mg by mouth daily. 05/23/22  Yes [provider]  loratadine (CLARITIN) 10 MG tablet  Take 10 mg by mouth daily.   Yes [provider]  metoprolol succinate (TOPROL-XL) 25 MG 24 hr tablet TAKE 1/2 TABLET BY MOUTH EVERY DAY 03/10/22  Yes Rollene Rotunda, MD  nitroGLYCERIN (NITROSTAT) 0.4 MG SL tablet Place 1 tablet (0.4 mg total) under the tongue every 5 (five) minutes as needed for chest pain. 05/25/23  Yes Rollene Rotunda, MD  pantoprazole (PROTONIX) 40 MG tablet Take 40 mg by mouth daily. 06/04/22  Yes [provider]  rosuvastatin (CRESTOR) 40 MG tablet Take 1 tablet (40 mg total) by mouth daily. 10/03/22  Yes Rollene Rotunda, MD  tamsulosin (FLOMAX)  0.4 MG CAPS capsule Take 0.4 mg by mouth daily.   Yes [provider]  traZODone (DESYREL) 50 MG tablet Take 50 mg by mouth at bedtime as needed for sleep. 07/25/20  Yes [provider]  ondansetron (ZOFRAN-ODT) 4 MG disintegrating tablet Take 1 tablet (4 mg total) by mouth every 8 (eight) hours as needed for nausea or vomiting. Patient not taking: Reported on 07/24/2023 07/21/23   Lynann Bologna, MD    Current Facility-Administered Medications  Medication Dose Route Frequency Provider Last Rate Last Admin   acetaminophen (TYLENOL) tablet 650 mg  650 mg Oral Q6H PRN Clydie Braun, MD       Or   acetaminophen (TYLENOL) suppository 650 mg  650 mg Rectal Q6H PRN Madelyn Flavors A, MD       albuterol (PROVENTIL) (2.5 MG/3ML) 0.083% nebulizer solution 2.5 mg  2.5 mg Nebulization Q6H PRN Clydie Braun, MD       [START ON 07/25/2023] enoxaparin (LOVENOX) injection 40 mg  40 mg Subcutaneous Q24H Esterwood, Amy S, PA-C       [START ON 07/25/2023] influenza vaccine adjuvanted (FLUAD) injection 0.5 mL  0.5 mL Intramuscular Tomorrow-1000 Smith, Rondell A, MD       loratadine (CLARITIN) tablet 10 mg  10 mg Oral Daily Smith, Rondell A, MD   10 mg at 07/24/23 1610   Mesalamine (ASACOL) DR capsule 800 mg  800 mg Oral TID Madelyn Flavors A, MD   800 mg at 07/24/23 9604   metoprolol succinate (TOPROL-XL) 24 hr tablet 12.5 mg  12.5 mg Oral Daily Smith, Rondell A, MD   12.5 mg at 07/24/23 0822   ondansetron (ZOFRAN) tablet 4 mg  4 mg Oral Q6H PRN Madelyn Flavors A, MD       Or   ondansetron (ZOFRAN) injection 4 mg  4 mg Intravenous Q6H PRN Katrinka Blazing, Rondell A, MD       pantoprazole (PROTONIX) EC tablet 40 mg  40 mg Oral BID Katrinka Blazing, Rondell A, MD   40 mg at 07/24/23 5409   rosuvastatin (CRESTOR) tablet 40 mg  40 mg Oral Daily Smith, Rondell A, MD   40 mg at 07/24/23 8119   sodium chloride flush (NS) 0.9 % injection 3 mL  3 mL Intravenous Q12H Smith, Rondell A, MD       sucralfate (CARAFATE) 1  GM/10ML suspension 1 g  1 g Oral TID WC & HS Smith, Rondell A, MD   1 g at 07/24/23 0821   tamsulosin (FLOMAX) capsule 0.4 mg  0.4 mg Oral Daily Katrinka Blazing, Rondell A, MD   0.4 mg at 07/24/23 1478   traZODone (DESYREL) tablet 50 mg  50 mg Oral QHS PRN Clydie Braun, MD        Allergies as of 07/23/2023 - Review Complete 07/23/2023  Allergen Reaction Noted   Morphine Other (See  Comments)     Family History  Problem Relation Age of Onset   Heart attack Mother    Heart attack Father    Heart attack Brother    Colon cancer Neg Hx    Rectal cancer Neg Hx    Stomach cancer Neg Hx     Social History   Socioeconomic History   Marital status: Married    Spouse name: Teriq Rabon   Number of children: 2   Years of education: Not on file   Highest education level: Associate degree: academic program  Occupational History   Occupation: retired    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  Tobacco Use   Smoking status: Never   Smokeless tobacco: Never  Vaping Use   Vaping status: Never Used  Substance and Sexual Activity   Alcohol use: Yes    Alcohol/week: 1.0 standard drink of alcohol    Types: 1 Cans of beer per week   Drug use: Never   Sexual activity: Not Currently  Other Topics Concern   Not on file  Social History Narrative   Not on file   Social Determinants of Health   Financial Resource Strain: Low Risk  (01/29/2023)   Received from [facility], [facility]   Overall Financial Resource Strain (CARDIA)    Difficulty of Paying Living Expenses: Not hard at all  Food Insecurity: No Food Insecurity (07/24/2023)   Hunger Vital Sign    Worried About Running Out of Food in the Last Year: Never true    Ran Out of Food in the Last Year: Never true  Transportation Needs: No Transportation Needs (07/24/2023)   PRAPARE - Administrator, Civil Service (Medical): No    Lack of Transportation (Non-Medical): No  Physical Activity: Insufficiently Active (12/06/2020)    Exercise Vital Sign    Days of Exercise per Week: 3 days    Minutes of Exercise per Session: 30 min  Stress: Not on file  Social Connections: Unknown (05/16/2022)   Received from [facility], [facility]   Social Network    Social Network: Not on file  Intimate Partner Violence: Not At Risk (07/24/2023)   Humiliation, Afraid, Rape, and Kick questionnaire    Fear of Current or Ex-Partner: No    Emotionally Abused: No    Physically Abused: No    Sexually Abused: No    Review of Systems: Pertinent positive and negative review of systems were noted in the above HPI section.  All other review of systems was otherwise negative.   Physical Exam: Vital signs in last 24 hours: Temp:  [97.8 F (36.6 C)-98.3 F (36.8 C)] 97.8 F (36.6 C) (11/22 1137) Pulse Rate:  [63-73] 71 (11/22 1137) Resp:  [12-18] 18 (11/22 1137) BP: (114-142)/(62-75) 142/75 (11/22 1137) SpO2:  [94 %-99 %] 96 % (11/22 1137) Weight:  [103.1 kg-106.6 kg] 103.1 kg (11/22 1137)   General:   Alert,  Well-developed, well-nourished, elderly white male pleasant and cooperative in NAD, hard of hearing, wife at bedside Head:  Normocephalic and atraumatic. Eyes:  Sclera clear, no icterus.   Conjunctiva pink. Ears:  Normal auditory acuity. Nose:  No deformity, discharge,  or lesions. Mouth:  No deformity or lesions.   Neck:  Supple; no masses or thyromegaly. Lungs:  Clear throughout to auscultation.   No wheezes, crackles, or rhonchi.  Heart:  Regular rate and rhythm; no murmurs, clicks, rubs,  or gallops.  Journal incisional scar Abdomen:  Soft, obese, nontender, BS active,nonpalp  mass or hsm.   Rectal: Not done Msk:  Symmetrical without gross deformities. . Pulses:  Normal pulses noted. Extremities:  Without clubbing or edema. Neurologic:  Alert and  oriented x4;  grossly normal neurologically. Skin:  Intact without significant lesions or rashes.. Psych:  Alert and cooperative. Normal mood and  affect.  Intake/Output from previous day: No intake/output data recorded. Intake/Output this shift: No intake/output data recorded.  Lab Results: Recent Labs    07/23/23 1749  WBC 9.7  HGB 12.4*  HCT 39.9  PLT 278   BMET Recent Labs    07/23/23 1749  NA 140  K 4.5  CL 104  CO2 27  GLUCOSE 108*  BUN 20  CREATININE 1.42*  CALCIUM 9.6   LFT Recent Labs    07/23/23 1749  PROT 7.2  ALBUMIN 3.5  AST 20  ALT 21  ALKPHOS 77  BILITOT 1.6*   PT/INR No results for input(s): "LABPROT", "INR" in the last 72 hours. Hepatitis Panel No results for input(s): "HEPBSAG", "HCVAB", "HEPAIGM", "HEPBIGM" in the last 72 hours.     IMPRESSION:  #46 77 year old white male with several recurrent episodes of left upper abdominal pain radiating into the left chest and into the left shoulder occurring immediately after eating and associated with eventual nausea and vomiting with resolution of symptoms. Had 3-4 these episodes in the past 4 to 5 months but has had increasing frequency of these episodes over the past week or so and an intense episode that brought him to the emergency room yesterday lasting longer than prior episodes  He does have history of coronary artery disease and is status post CABG x 4 in 2022. He has had cardiac evaluation here, symptoms are not felt cardiac in origin Echo is pending  Symptoms are most worrisome for recurring gastric volvulus/torsion with known large hiatal hernia and intrathoracic stomach on the left on CT imaging.  Patient does have cholelithiasis but again symptom complex is not consistent with biliary colic.  #2 history of TIA/CVA 2023-on Plavix and aspirin #3 peripheral arterial disease #4.  Remote history of ulcerative colitis left-sided-remains on Asacol.  PLAN: Allow soft diet for dinner, then n.p.o. after midnight We will plan for EGD with Dr. Barron Alvine tomorrow 07/25/2023.  Procedure was discussed in detail with the patient including  indications risks and benefits and he is agreeable to proceed.  His last dose of Plavix was yesterday EGD is intended to be diagnostic only, and therefore no increased risk on Plavix  Continue twice daily PPI  Further recommendations pending results of EGD.    Amy Esterwood PA-C 07/24/2023, 2:25 PM

## 2023-07-24 NOTE — Consult Note (Addendum)
Attending physician's note   I have taken a history, reviewed the chart, and examined the patient. I performed a substantive portion of this encounter, including complete performance of at least one of the key components, in conjunction with the APP. I agree with the APP's note, impression, and recommendations with my edits.   77 year old male with medical history as outlined below, to include history of CAD s/p CABG (on ASA/Plavix), TIA, CVA, carotid stenosis, admitted with episodic LUQ pain radiating into the chest with associated nausea/vomiting.  Pain tends to be postprandial and immediately resolves with nausea/vomiting.  Independent of food types.  Symptoms have been worsening over the last few days which prompted him to seek care in the ER.  Was evaluated for the symptoms by Dr. Chales Abrahams on 07/21/2023, and had planned on EGD/colonoscopy as outpatient pending cardiology and neurology clearance.  Admission evaluation notable for the following: - CT C/A/P angiography: Large hiatal hernia, cholelithiasis without cholecystitis, emphysema, and no vascular changes/extravasation - RUQ [place]: Cholelithiasis without cholecystitis - H/H 12.4/40 (at baseline) - Renal function at baseline with BUN/creatinine 20/1.4 - Troponin 31 -->  33  --> 28  Was evaluated by the inpatient Cardiology service.  EKG w/o new ischemic changes and symptoms not felt to be ACS  TTE pending.  1) Large hiatal hernia 2) Epigastric pain/Noncardiac chest pain 3) Nausea/Vomiting - Plan for EGD tomorrow to evaluate for medical/luminal pathology, evaluate hernia size and any associated gastritis, Cameron's lesions, etc. - N.p.o. at midnight - Holding Plavix - Explained that diagnostic EGD along with biopsies can be done despite no Plavix washout, but would probably hold off on any polypectomy or other more invasive intervention  4) CAD 5) History of CABG - Evaluated by inpatient Cardiology service - Holding Plavix  6)  Dysphagia Patient did mention some intermittent trouble swallowing, pointing to his lower sternal border.  Unclear if this is food retained in the hernia or if he truly has area of luminal narrowing.  Will evaluate the EGD and perform esophageal dilation as appropriate.  The indications, risks, and benefits of EGD were explained to the patient in detail. Risks include but are not limited to bleeding, perforation, adverse reaction to medications, and cardiopulmonary compromise. Sequelae include but are not limited to the possibility of surgery, hospitalization, and mortality. The patient verbalized understanding and wished to proceed.    [address] [place], [place] [phone] office         Consultation  Referring Provider:TRH Katrinka Blazing Primary Care Physician:  [name], MD Primary Gastroenterologist:  Dr. Chales Abrahams  Reason for Consultation: Recurrent episodes of upper abdominal pain radiating to the chest, nausea and vomiting immediately postprandially  HPI: Martin [name] is a 77 y.o. male with history of coronary artery disease, status post CABG x 4 in 2022, history of TIA, on chronic aspirin and Plavix Also with history of BPH, hyperlipidemia, GERD, and mild left-sided ulcerative colitis. He was admitted yesterday after an episode of intense left upper abdominal pain radiating up into his chest and then into his left shoulder which occurred immediately after eating and was associated nausea and vomiting. He says this episode lasted for 3 to 4 hours which was longer than any of his previous episodes but again alleviated by vomiting. He has had similar episodes over the past 3 to 4 months and his wife says that the episodes have been increasing in frequency just over the past couple of weeks.  He denies any trouble with dysphagia or  odynophagia, feels fine between these episodes has not been having any difficulty eating or nausea.  No changes in bowel habits, no melena or hematochezia. He  presented to the ER yesterday and CT angio chest abdomen and pelvis does show significant coronary atherosclerotic changes, no aneurysm or dissection, large hiatal hernia with intrathoracic stomach on the left, cholelithiasis and COPD mesenteric vessels patent  Abdominal ultrasound-audible gallstones no wall thickening or pericholecystic fluid, CBD 4 mm, liver unremarkable  Labs yesterday with lipase 44 WBC 9.7/hemoglobin 12.4/hematocrit 39.9/MCV 96 BUN 20/creatinine 1.42 LFTs normal other than T. bili of 1.6 Troponins 30> 33  He was evaluated by cardiology earlier today and his symptoms are not felt consistent with angina EKG did not show any new ischemic changes Echo is pending  Patient had been seen just earlier this week by Dr. Chales Abrahams in the office, and plans were to do EGD and colonoscopy off Plavix.  He had not had a colonoscopy for many years and has remote history of UC for which he had remained on Asacol.  He is feeling much better today, has not had any episodes but has only been on liquids, is hungry and asking for food.  No prior abdominal surgery    Past Medical History:  Diagnosis Date   Anxiety and depression    Benign prostatic hypertrophy    Coronary artery disease    cath 04/2007 with 90-95% RCA stenosis. Had DES placed.  Had an acute stent thrombosis in the hospital that day and subsequently had 3 DES placed in the RCA. The LAD had 45% stenosis, in the circ had 35% stenosis. The EF was well preserved.   Dyslipidemia    GERD (gastroesophageal reflux disease)    Hypercholesterolemia    Insomnia    Ulcerative colitis     Past Surgical History:  Procedure Laterality Date   COLONOSCOPY  10/06/2016   Colonic polyps status post polypectomy. Previously diagnosed left sided ulcerative colitis now wiith only smudged vascular pattern (inactive endoscopically)   CORONARY ARTERY BYPASS GRAFT N/A 11/21/2020   Procedure: CORONARY ARTERY BYPASS GRAFTING (CABG) TIMES FOUR ON  PUMP USING BILATERAL INTERNAL MAMMARY ARTERIES AND LEFT RADIAL ARTERY;  Surgeon: [name], MD;  Location: [facility] OR;  Service: Open Heart Surgery;  Laterality: N/A;  BIMA   INGUINAL HERNIA REPAIR  1999   LEFT HEART CATH AND CORONARY ANGIOGRAPHY N/A 11/20/2020   Procedure: LEFT HEART CATH AND CORONARY ANGIOGRAPHY;  Surgeon: Lennette Bihari, MD;  Location: [facility] INVASIVE CV LAB;  Service: Cardiovascular;  Laterality: N/A;   RADIAL ARTERY HARVEST Left 11/21/2020   Procedure: RADIAL ARTERY HARVEST;  Surgeon: [name], MD;  Location: [facility] OR;  Service: Open Heart Surgery;  Laterality: Left;   RIGHT HEART CATH N/A 12/05/2020   Procedure: RIGHT HEART CATH;  Surgeon: Dolores Patty, MD;  Location: [facility] INVASIVE CV LAB;  Service: Cardiovascular;  Laterality: N/A;   ROTATOR CUFF REPAIR Left    TEE WITHOUT CARDIOVERSION N/A 11/21/2020   Procedure: TRANSESOPHAGEAL ECHOCARDIOGRAM (TEE);  Surgeon: [name], MD;  Location: [facility] OR;  Service: Open Heart Surgery;  Laterality: N/A;    Prior to Admission medications   Medication Sig Start Date End Date Taking? Authorizing Provider  ASACOL HD 800 MG TBEC Take 800 mg by mouth 3 (three) times daily. 07/09/15  Yes [provider]  clopidogrel (PLAVIX) 75 MG tablet Take 75 mg by mouth daily. 05/23/22  Yes [provider]  loratadine (CLARITIN) 10 MG tablet  Take 10 mg by mouth daily.   Yes [provider]  metoprolol succinate (TOPROL-XL) 25 MG 24 hr tablet TAKE 1/2 TABLET BY MOUTH EVERY DAY 03/10/22  Yes Rollene Rotunda, MD  nitroGLYCERIN (NITROSTAT) 0.4 MG SL tablet Place 1 tablet (0.4 mg total) under the tongue every 5 (five) minutes as needed for chest pain. 05/25/23  Yes Rollene Rotunda, MD  pantoprazole (PROTONIX) 40 MG tablet Take 40 mg by mouth daily. 06/04/22  Yes [provider]  rosuvastatin (CRESTOR) 40 MG tablet Take 1 tablet (40 mg total) by mouth daily. 10/03/22  Yes Rollene Rotunda, MD  tamsulosin (FLOMAX)  0.4 MG CAPS capsule Take 0.4 mg by mouth daily.   Yes [provider]  traZODone (DESYREL) 50 MG tablet Take 50 mg by mouth at bedtime as needed for sleep. 07/25/20  Yes [provider]  ondansetron (ZOFRAN-ODT) 4 MG disintegrating tablet Take 1 tablet (4 mg total) by mouth every 8 (eight) hours as needed for nausea or vomiting. Patient not taking: Reported on 07/24/2023 07/21/23   Lynann Bologna, MD    Current Facility-Administered Medications  Medication Dose Route Frequency Provider Last Rate Last Admin   acetaminophen (TYLENOL) tablet 650 mg  650 mg Oral Q6H PRN Clydie Braun, MD       Or   acetaminophen (TYLENOL) suppository 650 mg  650 mg Rectal Q6H PRN Madelyn Flavors A, MD       albuterol (PROVENTIL) (2.5 MG/3ML) 0.083% nebulizer solution 2.5 mg  2.5 mg Nebulization Q6H PRN Clydie Braun, MD       [START ON 07/25/2023] enoxaparin (LOVENOX) injection 40 mg  40 mg Subcutaneous Q24H Esterwood, Amy S, PA-C       [START ON 07/25/2023] influenza vaccine adjuvanted (FLUAD) injection 0.5 mL  0.5 mL Intramuscular Tomorrow-1000 Smith, Rondell A, MD       loratadine (CLARITIN) tablet 10 mg  10 mg Oral Daily Smith, Rondell A, MD   10 mg at 07/24/23 1610   Mesalamine (ASACOL) DR capsule 800 mg  800 mg Oral TID Madelyn Flavors A, MD   800 mg at 07/24/23 9604   metoprolol succinate (TOPROL-XL) 24 hr tablet 12.5 mg  12.5 mg Oral Daily Smith, Rondell A, MD   12.5 mg at 07/24/23 0822   ondansetron (ZOFRAN) tablet 4 mg  4 mg Oral Q6H PRN Madelyn Flavors A, MD       Or   ondansetron (ZOFRAN) injection 4 mg  4 mg Intravenous Q6H PRN Katrinka Blazing, Rondell A, MD       pantoprazole (PROTONIX) EC tablet 40 mg  40 mg Oral BID Katrinka Blazing, Rondell A, MD   40 mg at 07/24/23 5409   rosuvastatin (CRESTOR) tablet 40 mg  40 mg Oral Daily Smith, Rondell A, MD   40 mg at 07/24/23 8119   sodium chloride flush (NS) 0.9 % injection 3 mL  3 mL Intravenous Q12H Smith, Rondell A, MD       sucralfate (CARAFATE) 1  GM/10ML suspension 1 g  1 g Oral TID WC & HS Smith, Rondell A, MD   1 g at 07/24/23 0821   tamsulosin (FLOMAX) capsule 0.4 mg  0.4 mg Oral Daily Katrinka Blazing, Rondell A, MD   0.4 mg at 07/24/23 1478   traZODone (DESYREL) tablet 50 mg  50 mg Oral QHS PRN Clydie Braun, MD        Allergies as of 07/23/2023 - Review Complete 07/23/2023  Allergen Reaction Noted   Morphine Other (See  Comments)     Family History  Problem Relation Age of Onset   Heart attack Mother    Heart attack Father    Heart attack Brother    Colon cancer Neg Hx    Rectal cancer Neg Hx    Stomach cancer Neg Hx     Social History   Socioeconomic History   Marital status: Married    Spouse name: Martin [name]   Number of children: 2   Years of education: Not on file   Highest education level: Associate degree: academic program  Occupational History   Occupation: retired    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  Tobacco Use   Smoking status: Never   Smokeless tobacco: Never  Vaping Use   Vaping status: Never Used  Substance and Sexual Activity   Alcohol use: Yes    Alcohol/week: 1.0 standard drink of alcohol    Types: 1 Cans of beer per week   Drug use: Never   Sexual activity: Not Currently  Other Topics Concern   Not on file  Social History Narrative   Not on file   Social Determinants of Health   Financial Resource Strain: Low Risk  (01/29/2023)   Received from [facility], [facility]   Overall Financial Resource Strain (CARDIA)    Difficulty of Paying Living Expenses: Not hard at all  Food Insecurity: No Food Insecurity (07/24/2023)   Hunger Vital Sign    Worried About Running Out of Food in the Last Year: Never true    Ran Out of Food in the Last Year: Never true  Transportation Needs: No Transportation Needs (07/24/2023)   PRAPARE - Administrator, Civil Service (Medical): No    Lack of Transportation (Non-Medical): No  Physical Activity: Insufficiently Active (12/06/2020)    Exercise Vital Sign    Days of Exercise per Week: 3 days    Minutes of Exercise per Session: 30 min  Stress: Not on file  Social Connections: Unknown (05/16/2022)   Received from [facility], [facility]   Social Network    Social Network: Not on file  Intimate Partner Violence: Not At Risk (07/24/2023)   Humiliation, Afraid, Rape, and Kick questionnaire    Fear of Current or Ex-Partner: No    Emotionally Abused: No    Physically Abused: No    Sexually Abused: No    Review of Systems: Pertinent positive and negative review of systems were noted in the above HPI section.  All other review of systems was otherwise negative.   Physical Exam: Vital signs in last 24 hours: Temp:  [97.8 F (36.6 C)-98.3 F (36.8 C)] 97.8 F (36.6 C) (11/22 1137) Pulse Rate:  [63-73] 71 (11/22 1137) Resp:  [12-18] 18 (11/22 1137) BP: (114-142)/(62-75) 142/75 (11/22 1137) SpO2:  [94 %-99 %] 96 % (11/22 1137) Weight:  [103.1 kg-106.6 kg] 103.1 kg (11/22 1137)   General:   Alert,  Well-developed, well-nourished, elderly white male pleasant and cooperative in NAD, hard of hearing, wife at bedside Head:  Normocephalic and atraumatic. Eyes:  Sclera clear, no icterus.   Conjunctiva pink. Ears:  Normal auditory acuity. Nose:  No deformity, discharge,  or lesions. Mouth:  No deformity or lesions.   Neck:  Supple; no masses or thyromegaly. Lungs:  Clear throughout to auscultation.   No wheezes, crackles, or rhonchi.  Heart:  Regular rate and rhythm; no murmurs, clicks, rubs,  or gallops.  Journal incisional scar Abdomen:  Soft, obese, nontender, BS active,nonpalp  mass or hsm.   Rectal: Not done Msk:  Symmetrical without gross deformities. . Pulses:  Normal pulses noted. Extremities:  Without clubbing or edema. Neurologic:  Alert and  oriented x4;  grossly normal neurologically. Skin:  Intact without significant lesions or rashes.. Psych:  Alert and cooperative. Normal mood and  affect.  Intake/Output from previous day: No intake/output data recorded. Intake/Output this shift: No intake/output data recorded.  Lab Results: Recent Labs    07/23/23 1749  WBC 9.7  HGB 12.4*  HCT 39.9  PLT 278   BMET Recent Labs    07/23/23 1749  NA 140  K 4.5  CL 104  CO2 27  GLUCOSE 108*  BUN 20  CREATININE 1.42*  CALCIUM 9.6   LFT Recent Labs    07/23/23 1749  PROT 7.2  ALBUMIN 3.5  AST 20  ALT 21  ALKPHOS 77  BILITOT 1.6*   PT/INR No results for input(s): "LABPROT", "INR" in the last 72 hours. Hepatitis Panel No results for input(s): "HEPBSAG", "HCVAB", "HEPAIGM", "HEPBIGM" in the last 72 hours.     IMPRESSION:  #18 77 year old white male with several recurrent episodes of left upper abdominal pain radiating into the left chest and into the left shoulder occurring immediately after eating and associated with eventual nausea and vomiting with resolution of symptoms. Had 3-4 these episodes in the past 4 to 5 months but has had increasing frequency of these episodes over the past week or so and an intense episode that brought him to the emergency room yesterday lasting longer than prior episodes  He does have history of coronary artery disease and is status post CABG x 4 in 2022. He has had cardiac evaluation here, symptoms are not felt cardiac in origin Echo is pending  Symptoms are most worrisome for recurring gastric volvulus/torsion with known large hiatal hernia and intrathoracic stomach on the left on CT imaging.  Patient does have cholelithiasis but again symptom complex is not consistent with biliary colic.  #2 history of TIA/CVA 2023-on Plavix and aspirin #3 peripheral arterial disease #4.  Remote history of ulcerative colitis left-sided-remains on Asacol.  PLAN: Allow soft diet for dinner, then n.p.o. after midnight We will plan for EGD with Dr. Barron Alvine tomorrow 07/25/2023.  Procedure was discussed in detail with the patient including  indications risks and benefits and he is agreeable to proceed.  His last dose of Plavix was yesterday EGD is intended to be diagnostic only, and therefore no increased risk on Plavix  Continue twice daily PPI  Further recommendations pending results of EGD.    Amy Esterwood PA-C 07/24/2023, 2:25 PM

## 2023-07-24 NOTE — Anesthesia Preprocedure Evaluation (Signed)
Anesthesia Evaluation  Patient identified by [name], date of birth, ID band Patient awake    Reviewed: Allergy & Precautions, NPO status , Patient's Chart, lab work & pertinent test results  Airway Mallampati: III  TM Distance: >3 FB Neck ROM: Full    Dental  (+) Dental Advisory Given, Edentulous Upper, Edentulous Lower   Pulmonary neg pulmonary ROS   Pulmonary exam normal breath sounds clear to auscultation       Cardiovascular hypertension, Pt. on medications and Pt. on home beta blockers + angina  + CAD, + Cardiac Stents, + CABG and +CHF   Rhythm:Regular Rate:Normal + Systolic murmurs Echo 07/25/2023  1. Left ventricular ejection fraction, by estimation, is 50 to 55%. The left ventricle has low normal function. The left ventricle demonstrates regional wall motion abnormalities (see scoring diagram/findings for description). Left ventricular diastolic   parameters are consistent with Grade I diastolic dysfunction (impaired relaxation).   2. Right ventricular systolic function is normal. The right ventricular size is normal.   3. The mitral valve is normal in structure. No evidence of mitral valve regurgitation. No evidence of mitral stenosis.   4. The aortic valve is grossly normal. Aortic valve regurgitation is trivial. No aortic stenosis is present.   5. The inferior vena cava is normal in size with greater than 50% respiratory variability, suggesting right atrial pressure of 3 mmHg.    Echo 03/2021  1. Left ventricular ejection fraction, by estimation, is 60 to 65%. The left ventricle has normal function. The left ventricle has no regional wall motion abnormalities. Left ventricular diastolic parameters are consistent with Grade I diastolic  dysfunction (impaired relaxation).   2. Right ventricular systolic function is mildly reduced. The right ventricular size is normal.   3. No pericardial effusion   Comparison(s): Changes from  prior study are noted. 12/14/2020: LVEF 50-55%,  moderate pericardial effusion.   Echo 11/2020  1. Limited study with poor acoustic windows.   2. There is a moderate pericardial effusion that appears to be  circumferential but mainly located around the LV. Largest diameter on limited views was about 1.5cm at end-diastole (posterolateral LV). No evidence of tamponade features. IVC not visualized.   3. Based on very limited views, the left ventricular ejection fraction, by estimation, is 50 to 55%. Left ventricular endocardial border not optimally defined to evaluate regional wall motion. There is mild concentric left ventricular hypertrophy.   4. Right ventricular systolic function was not well visualized. The right ventricular size is not well visualized.   5. The mitral valve is grossly normal. Trivial mitral valve regurgitation. No evidence of mitral stenosis.   6. The aortic valve is tricuspid. There is mild calcification of the aortic valve. Aortic valve regurgitation is trivial. Mild aortic valve sclerosis is present, with no evidence of aortic valve stenosis.   Comparison(s): Compared to prior echo, the effusion looks slightly smaller on current study but remains moderate in size.     Neuro/Psych  PSYCHIATRIC DISORDERS Anxiety Depression    negative neurological ROS     GI/Hepatic Neg liver ROS, hiatal hernia, PUD,GERD  ,,  Endo/Other  negative endocrine ROS    Renal/GU Renal disease     Musculoskeletal   Abdominal   Peds  Hematology  (+) Blood dyscrasia, anemia   Anesthesia Other Findings   Reproductive/Obstetrics                             Lab  Results  Component Value Date   WBC 9.7 07/23/2023   HGB 12.4 (L) 07/23/2023   HCT 39.9 07/23/2023   MCV 96.8 07/23/2023   PLT 278 07/23/2023   Lab Results  Component Value Date   CREATININE 1.42 (H) 07/23/2023   BUN 20 07/23/2023   NA 140 07/23/2023   K 4.5 07/23/2023   CL 104 07/23/2023   CO2  27 07/23/2023    Anesthesia Physical Anesthesia Plan  ASA: 3  Anesthesia Plan: MAC   Post-op Pain Management: Minimal or no pain anticipated   Induction: Intravenous  PONV Risk Score and Plan: 2 and Treatment may vary due to age or medical condition, Propofol infusion and TIVA  Airway Management Planned: Natural Airway  Additional Equipment:   Intra-op Plan:   Post-operative Plan:   Informed Consent: I have reviewed the patients History and Physical, chart, labs and discussed the procedure including the risks, benefits and alternatives for the proposed anesthesia with the patient or authorized representative who has indicated his/her understanding and acceptance.     Dental advisory given  Plan Discussed with: CRNA  Anesthesia Plan Comments:         Anesthesia Quick Evaluation

## 2023-07-24 NOTE — ED Notes (Signed)
ED TO INPATIENT HANDOFF REPORT  ED Nurse Name and Phone #: Sheilah Mins [phone]  S Name/Age/Gender Martin [name] 77 y.o. male Room/Bed: 001C/001C  Code Status   Code Status: Full Code  Home/SNF/Other Home Patient oriented to: self, place, time, and situation Is this baseline? Yes   Triage Complete: Triage complete  Chief Complaint Atypical chest pain [R07.89]  Triage Note Pt came in via POV d/t abd pain with n/v after he eats for the last 3 days. Endorses that the pain feels like it radiates up into his Lt shoulder. A/Ox4, Does feel some pain in his chest as well, rates his pain 7/10.    Allergies Allergies  Allergen Reactions   Morphine Other (See Comments)    Hot, sweats, flushed    Level of Care/Admitting Diagnosis ED Disposition     ED Disposition  Admit   Condition  --   Comment  Hospital Area: [facility] [[id number]]  Level of Care: Telemetry Cardiac [[id number]]  May place patient in observation at [facility] or [facility] if equivalent level of care is available:: No  Covid Evaluation: Asymptomatic - no recent exposure (last 10 days) testing not required  Diagnosis: Atypical chest pain [297556]  Admitting Physician: Clydie Braun [[id number]]  Attending Physician: Clydie Braun [[id number]]          B Medical/Surgery History Past Medical History:  Diagnosis Date   Anxiety and depression    Benign prostatic hypertrophy    Coronary artery disease    cath 04/2007 with 90-95% RCA stenosis. Had DES placed.  Had an acute stent thrombosis in the hospital that day and subsequently had 3 DES placed in the RCA. The LAD had 45% stenosis, in the circ had 35% stenosis. The EF was well preserved.   Dyslipidemia    GERD (gastroesophageal reflux disease)    Hypercholesterolemia    Insomnia    Ulcerative colitis    Past Surgical History:  Procedure Laterality Date   COLONOSCOPY  10/06/2016   Colonic polyps status post polypectomy. Previously  diagnosed left sided ulcerative colitis now wiith only smudged vascular pattern (inactive endoscopically)   CORONARY ARTERY BYPASS GRAFT N/A 11/21/2020   Procedure: CORONARY ARTERY BYPASS GRAFTING (CABG) TIMES FOUR ON PUMP USING BILATERAL INTERNAL MAMMARY ARTERIES AND LEFT RADIAL ARTERY;  Surgeon: [name], MD;  Location: [facility] OR;  Service: Open Heart Surgery;  Laterality: N/A;  BIMA   INGUINAL HERNIA REPAIR  1999   LEFT HEART CATH AND CORONARY ANGIOGRAPHY N/A 11/20/2020   Procedure: LEFT HEART CATH AND CORONARY ANGIOGRAPHY;  Surgeon: Lennette Bihari, MD;  Location: [facility] INVASIVE CV LAB;  Service: Cardiovascular;  Laterality: N/A;   RADIAL ARTERY HARVEST Left 11/21/2020   Procedure: RADIAL ARTERY HARVEST;  Surgeon: [name], MD;  Location: [facility] OR;  Service: Open Heart Surgery;  Laterality: Left;   RIGHT HEART CATH N/A 12/05/2020   Procedure: RIGHT HEART CATH;  Surgeon: Dolores Patty, MD;  Location: [facility] INVASIVE CV LAB;  Service: Cardiovascular;  Laterality: N/A;   ROTATOR CUFF REPAIR Left    TEE WITHOUT CARDIOVERSION N/A 11/21/2020   Procedure: TRANSESOPHAGEAL ECHOCARDIOGRAM (TEE);  Surgeon: [name], MD;  Location: [facility] OR;  Service: Open Heart Surgery;  Laterality: N/A;     A IV Location/Drains/Wounds Patient Lines/Drains/Airways Status     Active Line/Drains/Airways     Name Placement date Placement time Site Days   Peripheral IV 07/23/23 20 G Left Antecubital 07/23/23  2352  Antecubital  1            Intake/Output Last 24 hours No intake or output data in the 24 hours ending 07/24/23 1042  Labs/Imaging Results for orders placed or performed during the hospital encounter of 07/23/23 (from the past 48 hour(s))  Lipase, blood     Status: None   Collection Time: 07/23/23  5:49 PM  Result Value Ref Range   Lipase 44 11 - 51 U/L    Comment: Performed at [facility], [address], [place], [place] [address]  Comprehensive metabolic panel      Status: Abnormal   Collection Time: 07/23/23  5:49 PM  Result Value Ref Range   Sodium 140 135 - 145 mmol/L   Potassium 4.5 3.5 - 5.1 mmol/L   Chloride 104 98 - 111 mmol/L   CO2 27 22 - 32 mmol/L   Glucose, Bld 108 (H) 70 - 99 mg/dL    Comment: Glucose reference range applies only to samples taken after fasting for at least 8 hours.   BUN 20 8 - 23 mg/dL   Creatinine, Ser 6.04 (H) 0.61 - 1.24 mg/dL   Calcium 9.6 8.9 - 54.0 mg/dL   Total Protein 7.2 6.5 - 8.1 g/dL   Albumin 3.5 3.5 - 5.0 g/dL   AST 20 15 - 41 U/L   ALT 21 0 - 44 U/L   Alkaline Phosphatase 77 38 - 126 U/L   Total Bilirubin 1.6 (H) <1.2 mg/dL   GFR, Estimated 51 (L) >60 mL/min    Comment: (NOTE) Calculated using the CKD-EPI Creatinine Equation (2021)    Anion gap 9 5 - 15    Comment: Performed at [facility], [address], [place], [place] [address]  CBC     Status: Abnormal   Collection Time: 07/23/23  5:49 PM  Result Value Ref Range   WBC 9.7 4.0 - 10.5 K/uL   RBC 4.12 (L) 4.22 - 5.81 MIL/uL   Hemoglobin 12.4 (L) 13.0 - 17.0 g/dL   HCT 14.7 82.9 - 56.2 %   MCV 96.8 80.0 - 100.0 fL   MCH 30.1 26.0 - 34.0 pg   MCHC 31.1 30.0 - 36.0 g/dL   RDW 13.0 86.5 - 78.4 %   Platelets 278 150 - 400 K/uL   nRBC 0.0 0.0 - 0.2 %    Comment: Performed at [facility], [address] [place], [place] [address]  Troponin I (High Sensitivity)     Status: Abnormal   Collection Time: 07/23/23  5:49 PM  Result Value Ref Range   Troponin I (High Sensitivity) 31 (H) <18 ng/L    Comment: (NOTE) Elevated high sensitivity troponin I (hsTnI) values and significant  changes across serial measurements may suggest ACS but many other  chronic and acute conditions are known to elevate hsTnI results.  Refer to the "Links" section for chest pain algorithms and additional  guidance. Performed at [facility], [address] [place], [place] [address]   Hemoglobin A1c     Status: Abnormal   Collection Time:  07/23/23  5:53 PM  Result Value Ref Range   Hgb A1c MFr Bld 6.2 (H) 4.8 - 5.6 %    Comment: (NOTE) Pre diabetes:          5.7%-6.4%  Diabetes:              >6.4%  Glycemic control for   <7.0% adults with diabetes  Mean Plasma Glucose 131.24 mg/dL    Comment: Performed at [facility], [address], [place], [place] [address]  Urinalysis, Routine w reflex microscopic -Urine, Clean Catch     Status: Abnormal   Collection Time: 07/23/23  6:07 PM  Result Value Ref Range   Color, Urine AMBER (A) YELLOW    Comment: BIOCHEMICALS MAY BE AFFECTED BY COLOR   APPearance HAZY (A) CLEAR   Specific Gravity, Urine 1.028 1.005 - 1.030   pH 5.0 5.0 - 8.0   Glucose, UA NEGATIVE NEGATIVE mg/dL   Hgb urine dipstick NEGATIVE NEGATIVE   Bilirubin Urine NEGATIVE NEGATIVE   Ketones, ur NEGATIVE NEGATIVE mg/dL   Protein, ur 30 (A) NEGATIVE mg/dL   Nitrite NEGATIVE NEGATIVE   Leukocytes,Ua NEGATIVE NEGATIVE   RBC / HPF 0-5 0 - 5 RBC/hpf   WBC, UA 6-10 0 - 5 WBC/hpf   Bacteria, UA RARE (A) NONE SEEN   Squamous Epithelial / HPF 0-5 0 - 5 /HPF   Mucus PRESENT    Hyaline Casts, UA PRESENT     Comment: Performed at [facility], [address] [place], [place] [address]  Troponin I (High Sensitivity)     Status: Abnormal   Collection Time: 07/23/23  8:09 PM  Result Value Ref Range   Troponin I (High Sensitivity) 33 (H) <18 ng/L    Comment: (NOTE) Elevated high sensitivity troponin I (hsTnI) values and significant  changes across serial measurements may suggest ACS but many other  chronic and acute conditions are known to elevate hsTnI results.  Refer to the "Links" section for chest pain algorithms and additional  guidance. Performed at [facility], [address], [place], [place] [address]   Troponin I (High Sensitivity)     Status: Abnormal   Collection Time: 07/24/23  3:46 AM  Result Value Ref Range   Troponin I (High Sensitivity) 28 (H) <18 ng/L    Comment:  (NOTE) Elevated high sensitivity troponin I (hsTnI) values and significant  changes across serial measurements may suggest ACS but many other  chronic and acute conditions are known to elevate hsTnI results.  Refer to the "Links" section for chest pain algorithms and additional  guidance. Performed at [facility], [address] [place], [place] [address]    US Abdomen Limited RUQ (LIVER/GB)  Result Date: 07/24/2023 CLINICAL DATA:  Right upper quadrant pain EXAM: ULTRASOUND ABDOMEN LIMITED RIGHT UPPER QUADRANT COMPARISON:  None Available. FINDINGS: Gallbladder: Gallbladder is well distended. Multiple small gallstones are noted. No wall thickening or pericholecystic fluid is noted. Negative sonographic Murphy's sign is elicited. Common bile duct: Diameter: 4.1 mm. Liver: No focal lesion identified. Within normal limits in parenchymal echogenicity. Portal vein is patent on color Doppler imaging with normal direction of blood flow towards the liver. Other: None. IMPRESSION: Cholelithiasis without complicating factors. Electronically Signed   By: Alcide Clever M.D.   On: 07/24/2023 03:15   CT Angio Chest/Abd/Pel for Dissection W and/or W/WO  Result Date: 07/24/2023 CLINICAL DATA:  Acute aortic syndrome suspected. Abdominal pain with nausea and vomiting after eating radiating to left shoulder. Chest wall pain. EXAM: CT ANGIOGRAPHY CHEST, ABDOMEN AND PELVIS TECHNIQUE: Non-contrast CT of the chest was initially obtained. Multidetector CT imaging through the chest, abdomen and pelvis was performed using the standard protocol during bolus administration of intravenous contrast. Multiplanar reconstructed images and MIPs were obtained and reviewed to evaluate the vascular anatomy. RADIATION DOSE REDUCTION: This exam was  performed according to the departmental dose-optimization program which includes automated exposure control, adjustment of the Johniece Hornbaker and/or kV according to patient size and/or use of  iterative reconstruction technique. CONTRAST:  75mL OMNIPAQUE IOHEXOL 350 MG/ML SOLN COMPARISON:  12/05/2020, 12/10/2021. FINDINGS: CTA CHEST FINDINGS Cardiovascular: The heart is normal in size and there is no pericardial effusion. Multi-vessel coronary artery calcifications are noted. There is atherosclerotic calcification of the aorta without evidence of aneurysm or dissection. The pulmonary trunk is normal in caliber. 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 Mediastinum/Nodes: No enlarged mediastinal, hilar, or axillary lymph nodes. Thyroid gland, trachea, and esophagus demonstrate no significant findings. There is a large hiatal hernia on the left with the stomach intrathoracic in location. Lungs/Pleura: Paraseptal and centrilobular emphysematous changes are noted in the lungs. Pleural and parenchymal scarring is present at the lung apices. Atelectasis is present at the lung bases. No effusion or pneumothorax. Musculoskeletal: Sternotomy wires are noted. Degenerative changes are present in the thoracic spine. Review of the MIP images confirms the above findings. CTA ABDOMEN AND PELVIS FINDINGS VASCULAR Aorta: Normal caliber aorta without aneurysm, dissection, vasculitis or significant stenosis. Aortic atherosclerosis. Celiac: Patent without evidence of aneurysm, dissection, vasculitis or significant stenosis. SMA: Patent without evidence of aneurysm, dissection, vasculitis or significant stenosis. Renals: Both renal arteries are patent without evidence of aneurysm, dissection, vasculitis, fibromuscular dysplasia. There is moderate stenosis involving the left renal ostia secondary to atherosclerotic calcification. No significant stenosis is seen on the right. IMA: Patent. Inflow: Patent without evidence of aneurysm, dissection, vasculitis or significant stenosis. Veins: No obvious venous abnormality within the limitations of this arterial phase study. Review of the MIP images confirms the  above findings. NON-VASCULAR Hepatobiliary: No focal liver abnormality is seen. A few stones are present within the gallbladder. No biliary ductal dilatation. Pancreas: Unremarkable. No pancreatic ductal dilatation or surrounding inflammatory changes. Spleen: Normal in size without focal abnormality. Adrenals/Urinary Tract: The adrenal glands are within normal limits. The kidneys enhance symmetrically. Hypodensities are present in the left kidney which are too small to further characterize. No renal calculus or hydronephrosis. The bladder is unremarkable. Stomach/Bowel: Large hiatal hernia with the stomach intrathoracic in location on the left. No bowel obstruction, free air, or pneumatosis. A focal bowel wall thickening or surrounding inflammatory changes. Appendix appears normal. Lymphatic: No abdominal or pelvic lymphadenopathy. Reproductive: Brachytherapy seeds are noted in the prostate gland. Other: No abdominopelvic ascites. Musculoskeletal: Degenerative changes are present in the lumbar spine. No acute osseous abnormality. Review of the MIP images confirms the above findings. IMPRESSION: 1. Atherosclerotic calcification with no evidence of aneurysm or dissection. 2. Large hiatal hernia with the stomach intrathoracic in location on the left. 3. Cholelithiasis. 4. Emphysema. Electronically Signed   By: Thornell Sartorius M.D.   On: 07/24/2023 00:46   DG Chest 2 View  Result Date: 07/23/2023 CLINICAL DATA:  Chest pain EXAM: CHEST - 2 VIEW COMPARISON:  12/20/2020 FINDINGS: Cardiac shadow is stable. Large hiatal hernia is again identified. Postsurgical changes are seen. The lungs are well aerated bilaterally. No focal infiltrate or sizable effusion is noted. IMPRESSION: Stable hiatal hernia.  No acute abnormality seen. Electronically Signed   By: Alcide Clever M.D.   On: 07/23/2023 19:11    Pending Labs Unresulted Labs (From admission, onward)     Start     Ordered   07/25/23 0500  CBC  Tomorrow morning,   R         07/24/23 0741  07/25/23 0500  Comprehensive metabolic panel  Tomorrow morning,   R        07/24/23 0741            Vitals/Pain Today's Vitals   07/24/23 0823 07/24/23 0826 07/24/23 0920 07/24/23 0930  BP:   114/68 123/67  Pulse:   72 71  Resp:   17 13  Temp:  98.3 F (36.8 C)    TempSrc:  Oral    SpO2:   95% 96%  Weight:      Height:      PainSc: 0-No pain       Isolation Precautions No active isolations  Medications Medications  metoprolol succinate (TOPROL-XL) 24 hr tablet 12.5 mg (12.5 mg Oral Given 07/24/23 0822)  rosuvastatin (CRESTOR) tablet 40 mg (40 mg Oral Given 07/24/23 0822)  traZODone (DESYREL) tablet 50 mg (has no administration in time range)  tamsulosin (FLOMAX) capsule 0.4 mg (0.4 mg Oral Given 07/24/23 0821)  loratadine (CLARITIN) tablet 10 mg (10 mg Oral Given 07/24/23 0821)  enoxaparin (LOVENOX) injection 40 mg (40 mg Subcutaneous Given 07/24/23 0823)  sodium chloride flush (NS) 0.9 % injection 3 mL (has no administration in time range)  acetaminophen (TYLENOL) tablet 650 mg (has no administration in time range)    Or  acetaminophen (TYLENOL) suppository 650 mg (has no administration in time range)  ondansetron (ZOFRAN) tablet 4 mg (has no administration in time range)    Or  ondansetron (ZOFRAN) injection 4 mg (has no administration in time range)  albuterol (PROVENTIL) (2.5 MG/3ML) 0.083% nebulizer solution 2.5 mg (has no administration in time range)  sucralfate (CARAFATE) 1 GM/10ML suspension 1 g (1 g Oral Given 07/24/23 0821)  Mesalamine (ASACOL) DR capsule 800 mg (800 mg Oral Given 07/24/23 0921)  pantoprazole (PROTONIX) EC tablet 40 mg (40 mg Oral Given 07/24/23 0822)  iohexol (OMNIPAQUE) 350 MG/ML injection 75 mL (75 mLs Intravenous Contrast Given 07/24/23 0025)  pantoprazole (PROTONIX) injection 40 mg (40 mg Intravenous Given 07/24/23 0347)  sucralfate (CARAFATE) tablet 1 g (1 g Oral Given 07/24/23 0346)    Mobility walks      Focused Assessments Cardiac Assessment Handoff:    No results found for: "CKTOTAL", "CKMB", "CKMBINDEX", "TROPONINI" No results found for: "DDIMER" Does the Patient currently have chest pain? No    R Recommendations: See Admitting Provider Note  Report given to:   Additional Notes:

## 2023-07-24 NOTE — Plan of Care (Signed)
Will continue to monitor patient.

## 2023-07-24 NOTE — Consult Note (Signed)
Cardiology Consultation   Patient ID: Martin [name] [name]: [id number]; DOB: 04-17-1946  Admit date: 07/23/2023 Date of Consult: 07/24/2023  PCP:  [name], MD   [facility] Providers Cardiologist:  Rollene Rotunda, MD    Patient Profile:   Martin [name] is a 77 y.o. male with a history of CAD s/p CABG, post op atrial flutter, anxiety, depression, BPH, HLD, GERD and ulcerative colitis who is being seen 07/24/2023 for the evaluation of chest pain/abdominal pain at the request of Dr. Katrinka Blazing.  History of Present Illness:   77 yo male with a history of CAD s/p CABG, post op atrial flutter, anxiety, depression, BPH, HLD, GERD and ulcerative colitis who is being admitted with c/o abdominal pain/N/V for 3 weeks following meals. The abdominal pain radiates to his left chest and left arm. He has been seen as an outpatient by GI with plans in place for outpatient EGD/colonoscopy. In the ED, his troponin is 31-->33-->28. EKG sinus with inferior T wave inversion, slightly changed from prior EKG. CTA chest without evidence of aortic dissection. Large hiatal hernia noted. He does not endorse exertional chest pain. His abdominal pain occurs after meals and resolves when he vomits. No dyspnea or LE edema.    Past Medical History:  Diagnosis Date   Anxiety and depression    Benign prostatic hypertrophy    Coronary artery disease    cath 04/2007 with 90-95% RCA stenosis. Had DES placed.  Had an acute stent thrombosis in the hospital that day and subsequently had 3 DES placed in the RCA. The LAD had 45% stenosis, in the circ had 35% stenosis. The EF was well preserved.   Dyslipidemia    GERD (gastroesophageal reflux disease)    Hypercholesterolemia    Insomnia    Ulcerative colitis     Past Surgical History:  Procedure Laterality Date   COLONOSCOPY  10/06/2016   Colonic polyps status post polypectomy. Previously diagnosed left sided ulcerative colitis now wiith only smudged vascular  pattern (inactive endoscopically)   CORONARY ARTERY BYPASS GRAFT N/A 11/21/2020   Procedure: CORONARY ARTERY BYPASS GRAFTING (CABG) TIMES FOUR ON PUMP USING BILATERAL INTERNAL MAMMARY ARTERIES AND LEFT RADIAL ARTERY;  Surgeon: [name], MD;  Location: [facility] OR;  Service: Open Heart Surgery;  Laterality: N/A;  BIMA   INGUINAL HERNIA REPAIR  1999   LEFT HEART CATH AND CORONARY ANGIOGRAPHY N/A 11/20/2020   Procedure: LEFT HEART CATH AND CORONARY ANGIOGRAPHY;  Surgeon: Lennette Bihari, MD;  Location: [facility] INVASIVE CV LAB;  Service: Cardiovascular;  Laterality: N/A;   RADIAL ARTERY HARVEST Left 11/21/2020   Procedure: RADIAL ARTERY HARVEST;  Surgeon: [name], MD;  Location: [facility] OR;  Service: Open Heart Surgery;  Laterality: Left;   RIGHT HEART CATH N/A 12/05/2020   Procedure: RIGHT HEART CATH;  Surgeon: Dolores Patty, MD;  Location: [facility] INVASIVE CV LAB;  Service: Cardiovascular;  Laterality: N/A;   ROTATOR CUFF REPAIR Left    TEE WITHOUT CARDIOVERSION N/A 11/21/2020   Procedure: TRANSESOPHAGEAL ECHOCARDIOGRAM (TEE);  Surgeon: [name], MD;  Location: [facility] OR;  Service: Open Heart Surgery;  Laterality: N/A;     Home Medications:  Prior to Admission medications   Medication Sig Start Date End Date Taking? Authorizing Provider  ASACOL HD 800 MG TBEC Take 800 mg by mouth 3 (three) times daily. 07/09/15  Yes [provider]  clopidogrel (PLAVIX) 75 MG tablet Take 75 mg by mouth daily. 05/23/22  Yes [provider]  loratadine (CLARITIN) 10 MG tablet Take 10 mg by mouth daily.   Yes [provider]  metoprolol succinate (TOPROL-XL) 25 MG 24 hr tablet TAKE 1/2 TABLET BY MOUTH EVERY DAY 03/10/22  Yes Rollene Rotunda, MD  nitroGLYCERIN (NITROSTAT) 0.4 MG SL tablet Place 1 tablet (0.4 mg total) under the tongue every 5 (five) minutes as needed for chest pain. 05/25/23  Yes Rollene Rotunda, MD  pantoprazole (PROTONIX) 40 MG tablet Take 40 mg by mouth daily.  06/04/22  Yes [provider]  rosuvastatin (CRESTOR) 40 MG tablet Take 1 tablet (40 mg total) by mouth daily. 10/03/22  Yes Rollene Rotunda, MD  tamsulosin (FLOMAX) 0.4 MG CAPS capsule Take 0.4 mg by mouth daily.   Yes [provider]  traZODone (DESYREL) 50 MG tablet Take 50 mg by mouth at bedtime as needed for sleep. 07/25/20  Yes [provider]  ondansetron (ZOFRAN-ODT) 4 MG disintegrating tablet Take 1 tablet (4 mg total) by mouth every 8 (eight) hours as needed for nausea or vomiting. Patient not taking: Reported on 07/24/2023 07/21/23   Lynann Bologna, MD    Inpatient Medications: Scheduled Meds:  enoxaparin (LOVENOX) injection  40 mg Subcutaneous Q24H   loratadine  10 mg Oral Daily   Mesalamine  800 mg Oral TID   metoprolol succinate  12.5 mg Oral Daily   pantoprazole  40 mg Oral BID   rosuvastatin  40 mg Oral Daily   sodium chloride flush  3 mL Intravenous Q12H   sucralfate  1 g Oral TID WC & HS   tamsulosin  0.4 mg Oral Daily   Continuous Infusions:  PRN Meds: acetaminophen **OR** acetaminophen, albuterol, ondansetron **OR** ondansetron (ZOFRAN) IV, traZODone  Allergies:    Allergies  Allergen Reactions   Morphine Other (See Comments)    Hot, sweats, flushed    Social History:   Social History   Socioeconomic History   Marital status: Married    Spouse name: Sujal Aldama   Number of children: 2   Years of education: Not on file   Highest education level: Associate degree: academic program  Occupational History   Occupation: retired    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  Tobacco Use   Smoking status: Never   Smokeless tobacco: Never  Vaping Use   Vaping status: Never Used  Substance and Sexual Activity   Alcohol use: Yes    Alcohol/week: 1.0 standard drink of alcohol    Types: 1 Cans of beer per week   Drug use: Never   Sexual activity: Not Currently  Other Topics Concern   Not on file  Social History Narrative   Not on  file   Social Determinants of Health   Financial Resource Strain: Low Risk  (01/29/2023)   Received from [facility], [facility]   Overall Financial Resource Strain (CARDIA)    Difficulty of Paying Living Expenses: Not hard at all  Food Insecurity: No Food Insecurity (07/24/2023)   Hunger Vital Sign    Worried About Running Out of Food in the Last Year: Never true    Ran Out of Food in the Last Year: Never true  Transportation Needs: No Transportation Needs (07/24/2023)   PRAPARE - Administrator, Civil Service (Medical): No    Lack of Transportation (Non-Medical): No  Physical Activity: Insufficiently Active (12/06/2020)   Exercise Vital Sign    Days of Exercise per Week: 3 days    Minutes of Exercise per Session: 30  min  Stress: Not on file  Social Connections: Unknown (05/16/2022)   Received from [facility], [facility]   Social Network    Social Network: Not on file  Intimate Partner Violence: Not At Risk (07/24/2023)   Humiliation, Afraid, Rape, and Kick questionnaire    Fear of Current or Ex-Partner: No    Emotionally Abused: No    Physically Abused: No    Sexually Abused: No    Family History:   Family History  Problem Relation Age of Onset   Heart attack Mother    Heart attack Father    Heart attack Brother    Colon cancer Neg Hx    Rectal cancer Neg Hx    Stomach cancer Neg Hx      ROS:  Please see the history of present illness.  All other ROS reviewed and negative.     Physical Exam/Data:   Vitals:   07/24/23 0600 07/24/23 0826 07/24/23 0920 07/24/23 0930  BP:   114/68 123/67  Pulse: 69  72 71  Resp: 12  17 13   Temp:  98.3 F (36.8 C)    TempSrc:  Oral    SpO2: 94%  95% 96%  Weight:      Height:       No intake or output data in the 24 hours ending 07/24/23 1105    07/23/2023    5:47 PM 07/21/2023    8:44 AM 07/15/2023   12:10 PM  Last 3 Weights  Weight (lbs) 235 lb 236 lb 2 oz 235 lb  Weight (kg) 106.595 kg 107.106 kg  106.595 kg     Body mass index is 31 kg/m.  General:  Well nourished, well developed, in no acute distress HEENT: normal Neck: no JVD Vascular: No carotid bruits; Distal pulses 2+ bilaterally Cardiac:  normal S1, S2; RRR; no murmur  Lungs:  clear to auscultation bilaterally, no wheezing, rhonchi or rales  Abd: soft, nontender, no hepatomegaly  Ext: no edema Musculoskeletal:  No deformities, BUE and BLE strength normal and equal Skin: warm and dry  Neuro:  CNs 2-12 intact, no focal abnormalities noted Psych:  Normal affect   EKG:  The EKG was personally reviewed and demonstrates:  Sinus, inferior T wave inversion, chronic.  Telemetry:  Telemetry was personally reviewed and demonstrates:  Sinus  Relevant CV Studies:   Laboratory Data:  High Sensitivity Troponin:   Recent Labs  Lab 07/23/23 1749 07/23/23 2009 07/24/23 0346  TROPONINIHS 31* 33* 28*     Chemistry Recent Labs  Lab 07/23/23 1749  NA 140  K 4.5  CL 104  CO2 27  GLUCOSE 108*  BUN 20  CREATININE 1.42*  CALCIUM 9.6  GFRNONAA 51*  ANIONGAP 9    Recent Labs  Lab 07/23/23 1749  PROT 7.2  ALBUMIN 3.5  AST 20  ALT 21  ALKPHOS 77  BILITOT 1.6*   Lipids No results for input(s): "CHOL", "TRIG", "HDL", "LABVLDL", "LDLCALC", "CHOLHDL" in the last 168 hours.  Hematology Recent Labs  Lab 07/23/23 1749  WBC 9.7  RBC 4.12*  HGB 12.4*  HCT 39.9  MCV 96.8  MCH 30.1  MCHC 31.1  RDW 13.0  PLT 278   Thyroid No results for input(s): "TSH", "FREET4" in the last 168 hours.  BNPNo results for input(s): "BNP", "PROBNP" in the last 168 hours.  DDimer No results for input(s): "DDIMER" in the last 168 hours.   Radiology/Studies:  US Abdomen Limited RUQ (LIVER/GB)  Result Date: 07/24/2023  CLINICAL DATA:  Right upper quadrant pain EXAM: ULTRASOUND ABDOMEN LIMITED RIGHT UPPER QUADRANT COMPARISON:  None Available. FINDINGS: Gallbladder: Gallbladder is well distended. Multiple small gallstones are noted. No  wall thickening or pericholecystic fluid is noted. Negative sonographic Murphy's sign is elicited. Common bile duct: Diameter: 4.1 mm. Liver: No focal lesion identified. Within normal limits in parenchymal echogenicity. Portal vein is patent on color Doppler imaging with normal direction of blood flow towards the liver. Other: None. IMPRESSION: Cholelithiasis without complicating factors. Electronically Signed   By: Alcide Clever M.D.   On: 07/24/2023 03:15   CT Angio Chest/Abd/Pel for Dissection W and/or W/WO  Result Date: 07/24/2023 CLINICAL DATA:  Acute aortic syndrome suspected. Abdominal pain with nausea and vomiting after eating radiating to left shoulder. Chest wall pain. EXAM: CT ANGIOGRAPHY CHEST, ABDOMEN AND PELVIS TECHNIQUE: Non-contrast CT of the chest was initially obtained. Multidetector CT imaging through the chest, abdomen and pelvis was performed using the standard protocol during bolus administration of intravenous contrast. Multiplanar reconstructed images and MIPs were obtained and reviewed to evaluate the vascular anatomy. RADIATION DOSE REDUCTION: This exam was performed according to the departmental dose-optimization program which includes automated exposure control, adjustment of the mA and/or kV according to patient size and/or use of iterative reconstruction technique. CONTRAST:  75mL OMNIPAQUE IOHEXOL 350 MG/ML SOLN COMPARISON:  12/05/2020, 12/10/2021. FINDINGS: CTA CHEST FINDINGS Cardiovascular: The heart is normal in size and there is no pericardial effusion. Multi-vessel coronary artery calcifications are noted. There is atherosclerotic calcification of the aorta without evidence of aneurysm or dissection. The pulmonary trunk is normal in caliber. 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 Mediastinum/Nodes: No enlarged mediastinal, hilar, or axillary lymph nodes. Thyroid gland, trachea, and esophagus demonstrate no significant findings. There is a large hiatal  hernia on the left with the stomach intrathoracic in location. Lungs/Pleura: Paraseptal and centrilobular emphysematous changes are noted in the lungs. Pleural and parenchymal scarring is present at the lung apices. Atelectasis is present at the lung bases. No effusion or pneumothorax. Musculoskeletal: Sternotomy wires are noted. Degenerative changes are present in the thoracic spine. Review of the MIP images confirms the above findings. CTA ABDOMEN AND PELVIS FINDINGS VASCULAR Aorta: Normal caliber aorta without aneurysm, dissection, vasculitis or significant stenosis. Aortic atherosclerosis. Celiac: Patent without evidence of aneurysm, dissection, vasculitis or significant stenosis. SMA: Patent without evidence of aneurysm, dissection, vasculitis or significant stenosis. Renals: Both renal arteries are patent without evidence of aneurysm, dissection, vasculitis, fibromuscular dysplasia. There is moderate stenosis involving the left renal ostia secondary to atherosclerotic calcification. No significant stenosis is seen on the right. IMA: Patent. Inflow: Patent without evidence of aneurysm, dissection, vasculitis or significant stenosis. Veins: No obvious venous abnormality within the limitations of this arterial phase study. Review of the MIP images confirms the above findings. NON-VASCULAR Hepatobiliary: No focal liver abnormality is seen. A few stones are present within the gallbladder. No biliary ductal dilatation. Pancreas: Unremarkable. No pancreatic ductal dilatation or surrounding inflammatory changes. Spleen: Normal in size without focal abnormality. Adrenals/Urinary Tract: The adrenal glands are within normal limits. The kidneys enhance symmetrically. Hypodensities are present in the left kidney which are too small to further characterize. No renal calculus or hydronephrosis. The bladder is unremarkable. Stomach/Bowel: Large hiatal hernia with the stomach intrathoracic in location on the left. No bowel  obstruction, free air, or pneumatosis. A focal bowel wall thickening or surrounding inflammatory changes. Appendix appears normal. Lymphatic: No abdominal or pelvic lymphadenopathy. Reproductive: Brachytherapy seeds are  noted in the prostate gland. Other: No abdominopelvic ascites. Musculoskeletal: Degenerative changes are present in the lumbar spine. No acute osseous abnormality. Review of the MIP images confirms the above findings. IMPRESSION: 1. Atherosclerotic calcification with no evidence of aneurysm or dissection. 2. Large hiatal hernia with the stomach intrathoracic in location on the left. 3. Cholelithiasis. 4. Emphysema. Electronically Signed   By: Thornell Sartorius M.D.   On: 07/24/2023 00:46   DG Chest 2 View  Result Date: 07/23/2023 CLINICAL DATA:  Chest pain EXAM: CHEST - 2 VIEW COMPARISON:  12/20/2020 FINDINGS: Cardiac shadow is stable. Large hiatal hernia is again identified. Postsurgical changes are seen. The lungs are well aerated bilaterally. No focal infiltrate or sizable effusion is noted. IMPRESSION: Stable hiatal hernia.  No acute abnormality seen. Electronically Signed   By: Alcide Clever M.D.   On: 07/23/2023 19:11     Assessment and Plan:   CAD s/p CABG without angina/Abdominal pain: He is being admitted with abdominal pain that occurs after meals. His symptoms do not sound cardiac related. Troponin is slightly above normal levels with flat trend. This does not suggest ACS. EKG does not show new ischemic changes. An echo has been ordered by the primary team. Will follow up on echo results. I do not anticipate any further cardiac workup but we will see him tomorrow.   For questions or updates, please contact [facility] Please consult [email or web address] for contact info under    Signed, Verne Carrow, MD  07/24/2023 11:05 AM

## 2023-07-24 NOTE — H&P (Addendum)
History and Physical    Patient: Martin [name] QMV:[id number] DOB: 1946-07-30 DOA: 07/23/2023 DOS: the patient was seen and examined on 07/24/2023 PCP: [name], MD  Patient coming from: Home  Chief Complaint:  Chief Complaint  Patient presents with   Abdominal Pain   Chest Pain   Nausea   Emesis   HPI: Martin [name] is a 77 y.o. male with medical history significant of hypertension, hyperlipidemia, CAD s/p CABG, anxiety, depression, BPH, and GERD who presents with complaints of abdominal pain with nausea and vomiting.  Symptom have been present for at least 3 weeks.  About 10 to 15 minutes after eating you have developed a constant dull pain epigastrically on the left-hand side that would radiate into his chest and to his left shoulder blade.  Vomiting he notes relieves symptoms.  Emesis is usually of food contents.  He stated that he was able to keep liquids and his medications down.  Due to his symptoms he had been seen by gastroenterology on 11/19 with plans for EGD and colonoscopy in the future.  However, over the last 3 days patient reports symptoms have been persistently occurring for which she had not been able to keep any significant amount of food down.  He used to have regular bowel movements every day up until the last month or so where he has a bowel movement every 2 to 3 days.  In the emergency department patient was afebrile with stable vital signs.  Labs significant for hemoglobin 12.4, BUN 20, creatinine 1.42, total bilirubin 1.6, lipase within normal limits 44,   high-sensitivity troponins 31->33->28, and all other labs relatively within normal limits.  Chest x-ray noted stable hiatal hernia.  Urinalysis noted rare bacteria with only 6-10 WBCs.  CT angiogram of the chest abdomen pelvis have been obtained which noted atherosclerotic calcification without evidence of aneurysm or dissection, a large hiatal hernia with the stomach intrathoracic in location on the left,  cholelithiasis, and emphysema.  Subsequent abdominal ultrasound had also been obtained which noted cholelithiasis without complicating factors.  Patient had been given Protonix 40 mg IV and Carafate 1 g.   Review of Systems: As mentioned in the history of present illness. All other systems reviewed and are negative. Past Medical History:  Diagnosis Date   Anxiety and depression    Benign prostatic hypertrophy    Coronary artery disease    cath 04/2007 with 90-95% RCA stenosis. Had DES placed.  Had an acute stent thrombosis in the hospital that day and subsequently had 3 DES placed in the RCA. The LAD had 45% stenosis, in the circ had 35% stenosis. The EF was well preserved.   Dyslipidemia    GERD (gastroesophageal reflux disease)    Hypercholesterolemia    Insomnia    Ulcerative colitis    Past Surgical History:  Procedure Laterality Date   COLONOSCOPY  10/06/2016   Colonic polyps status post polypectomy. Previously diagnosed left sided ulcerative colitis now wiith only smudged vascular pattern (inactive endoscopically)   CORONARY ARTERY BYPASS GRAFT N/A 11/21/2020   Procedure: CORONARY ARTERY BYPASS GRAFTING (CABG) TIMES FOUR ON PUMP USING BILATERAL INTERNAL MAMMARY ARTERIES AND LEFT RADIAL ARTERY;  Surgeon: [name], MD;  Location: [facility] OR;  Service: Open Heart Surgery;  Laterality: N/A;  BIMA   INGUINAL HERNIA REPAIR  1999   LEFT HEART CATH AND CORONARY ANGIOGRAPHY N/A 11/20/2020   Procedure: LEFT HEART CATH AND CORONARY ANGIOGRAPHY;  Surgeon: Lennette Bihari, MD;  Location: [facility]  INVASIVE CV LAB;  Service: Cardiovascular;  Laterality: N/A;   RADIAL ARTERY HARVEST Left 11/21/2020   Procedure: RADIAL ARTERY HARVEST;  Surgeon: [name], MD;  Location: [facility] OR;  Service: Open Heart Surgery;  Laterality: Left;   RIGHT HEART CATH N/A 12/05/2020   Procedure: RIGHT HEART CATH;  Surgeon: Dolores Patty, MD;  Location: [facility] INVASIVE CV LAB;  Service: Cardiovascular;  Laterality:  N/A;   ROTATOR CUFF REPAIR Left    TEE WITHOUT CARDIOVERSION N/A 11/21/2020   Procedure: TRANSESOPHAGEAL ECHOCARDIOGRAM (TEE);  Surgeon: [name], MD;  Location: [facility] OR;  Service: Open Heart Surgery;  Laterality: N/A;   Social History:  reports that he has never smoked. He has never used smokeless tobacco. He reports current alcohol use of about 1.0 standard drink of alcohol per week. He reports that he does not use drugs.  Allergies  Allergen Reactions   Morphine Other (See Comments)    Hot, sweats, flushed    Family History  Problem Relation Age of Onset   Heart attack Mother    Heart attack Father    Heart attack Brother    Colon cancer Neg Hx    Rectal cancer Neg Hx    Stomach cancer Neg Hx     Prior to Admission medications   Medication Sig Start Date End Date Taking? Authorizing Provider  ASACOL HD 800 MG TBEC Take 800 mg by mouth 3 (three) times daily. 07/09/15  Yes [provider]  clopidogrel (PLAVIX) 75 MG tablet Take 75 mg by mouth daily. 05/23/22  Yes [provider]  loratadine (CLARITIN) 10 MG tablet Take 10 mg by mouth daily.   Yes [provider]  metoprolol succinate (TOPROL-XL) 25 MG 24 hr tablet TAKE 1/2 TABLET BY MOUTH EVERY DAY 03/10/22  Yes Rollene Rotunda, MD  nitroGLYCERIN (NITROSTAT) 0.4 MG SL tablet Place 1 tablet (0.4 mg total) under the tongue every 5 (five) minutes as needed for chest pain. 05/25/23  Yes Rollene Rotunda, MD  pantoprazole (PROTONIX) 40 MG tablet Take 40 mg by mouth daily. 06/04/22  Yes [provider]  rosuvastatin (CRESTOR) 40 MG tablet Take 1 tablet (40 mg total) by mouth daily. 10/03/22  Yes Rollene Rotunda, MD  tamsulosin (FLOMAX) 0.4 MG CAPS capsule Take 0.4 mg by mouth daily.   Yes [provider]  traZODone (DESYREL) 50 MG tablet Take 50 mg by mouth at bedtime as needed for sleep. 07/25/20  Yes [provider]  ondansetron (ZOFRAN-ODT) 4 MG disintegrating tablet Take 1 tablet  (4 mg total) by mouth every 8 (eight) hours as needed for nausea or vomiting. Patient not taking: Reported on 07/24/2023 07/21/23   Lynann Bologna, MD    Physical Exam: Vitals:   07/24/23 0100 07/24/23 0448 07/24/23 0500 07/24/23 0600  BP: 121/73 138/70    Pulse: 63 70 69 69  Resp: 14 15 14 12   Temp: 98.3 F (36.8 C) 97.9 F (36.6 C)    TempSrc: Oral Oral    SpO2: 94% 99% 97% 94%  Weight:      Height:       Constitutional: Elderly male currently in NAD, calm, comfortable Eyes: PERRL, lids and conjunctivae normal ENMT: Mucous membranes are moist. Posterior pharynx clear of any exudate or lesions.Normal dentition.  Neck: normal, supple, no masses, no thyromegaly Respiratory: clear to auscultation bilaterally, no wheezing, no crackles. Normal respiratory effort. No accessory muscle use.  Cardiovascular: Regular rate and rhythm, no murmurs / rubs / gallops. No  extremity edema. 2+ pedal pulses. No carotid bruits.  Abdomen: no tenderness, no masses palpated. No hepatosplenomegaly. Bowel sounds positive.  Musculoskeletal: no clubbing / cyanosis. No joint deformity upper and lower extremities. Good ROM, no contractures. Normal muscle tone.  Skin: no rashes, lesions, ulcers. No induration Neurologic: CN 2-12 grossly intact.  Strength 5/5 in all 4.  Psychiatric: Normal judgment and insight. Alert and oriented x 3. Normal mood.   Data Reviewed:  EKG revealed normal sinus rhythm 86 bpm with a rightward axis deviation, and T wave abnormality the possibly suggest inferior ischemia.  Reviewed labs, imaging, and pertinent records as documented.  Assessment and Plan:  Epigastric pain Hiatal hernia GERD Nausea and vomiting Acute.  Patient presents with complaints of left upper quadrant and epigastric abdominal pain that radiates into his chest and left shoulder blade.  Symptoms occur after he has eaten and relieved with vomiting.  Labs significant for high-sensitivity troponins 31->33->28. CT  imaging noted large hiatal hernia located on the left intrathoracically.   Records note patient had been seen by [facility] due to concern of nausea with plan was for EGD and colocolostomy in the near future. Suspect the hiatal hernia could be the cause of his symptoms. -Admit to a telemetry bed -Aspiration precautions with elevation head of bed. -N.p.o. after midnight for EGD in a.m. -Protonix twice daily -Carafate -Antiemetics as needed -Gastroenterology consulted, will follow-up for any further recommendations  Atypical chest pain Elevated troponin CAD s/p CABG Patient reported having pains that radiated into his chest.  Labs significant for high-sensitivity troponins 31->33->28, but appear to be flat.  Suspect secondary to demand.  Patient's last heart cath was noted to be in 10/2022 which noted severe multivessel coronary artery disease at that time which patient underwent CABG. -Check echocardiogram -Held Plavix.  Plan to resume Plavix when medically appropriate to do so -Appreciate cardiology consultative services  Cholelithiasis Patient noted to have cholelithiasis without complicating features noted on ultrasound.     Renal insufficiency On admission creatinine mildly elevated at 1.42 with BUN 20.  Baseline creatinine appears to range 1-1.2. -Recheck kidney function in a.m.  Normocytic anemia Hemoglobin 12.4 which appears slightly higher than previous baseline of around 10. -Continue to monitor  Essential hypertension Blood pressures noted to be stable on admission. -Continue home blood pressure regimen  History of ulcerative colitis Patient with a prior history of left-sided ulcerative colitis, but thought to be inactive when last evaluated endoscopically. -Continue mesalamine  Hyperlipidemia -Continue Crestor  BPH -Continue Flomax  DVT prophylaxis: Advance Care Planning:   Code Status: Full Code   Consults: [facility]  Family Communication: None  Severity of  Illness: The appropriate patient status for this patient is OBSERVATION. Observation status is judged to be reasonable and necessary in order to provide the required intensity of service to ensure the patient's safety. The patient's presenting symptoms, physical exam findings, and initial radiographic and laboratory data in the context of their medical condition is felt to place them at decreased risk for further clinical deterioration. Furthermore, it is anticipated that the patient will be medically stable for discharge from the hospital within 2 midnights of admission.   Author: Clydie Braun, MD 07/24/2023 7:23 AM  For on call review [email or web address].

## 2023-07-25 ENCOUNTER — Encounter (HOSPITAL_COMMUNITY)
Admission: EM | Disposition: A | Discharge status: Home / Self Care | Payer: Self-pay | Source: Home / Self Care | Attending: Emergency Medicine

## 2023-07-25 ENCOUNTER — Observation Stay (HOSPITAL_COMMUNITY): Payer: Self-pay | Admitting: Anesthesiology

## 2023-07-25 ENCOUNTER — Encounter (HOSPITAL_COMMUNITY): Payer: Self-pay | Admitting: Internal Medicine

## 2023-07-25 ENCOUNTER — Emergency Department (HOSPITAL_BASED_OUTPATIENT_CLINIC_OR_DEPARTMENT_OTHER): Payer: Medicare PPO

## 2023-07-25 DIAGNOSIS — K449 Diaphragmatic hernia without obstruction or gangrene: Secondary | ICD-10-CM | POA: Diagnosis not present

## 2023-07-25 DIAGNOSIS — R933 Abnormal findings on diagnostic imaging of other parts of digestive tract: Secondary | ICD-10-CM | POA: Diagnosis not present

## 2023-07-25 DIAGNOSIS — K2270 Barrett's esophagus without dysplasia: Secondary | ICD-10-CM

## 2023-07-25 DIAGNOSIS — K3189 Other diseases of stomach and duodenum: Secondary | ICD-10-CM

## 2023-07-25 DIAGNOSIS — I10 Essential (primary) hypertension: Secondary | ICD-10-CM | POA: Diagnosis not present

## 2023-07-25 DIAGNOSIS — R1013 Epigastric pain: Secondary | ICD-10-CM | POA: Diagnosis not present

## 2023-07-25 DIAGNOSIS — R0789 Other chest pain: Secondary | ICD-10-CM | POA: Diagnosis not present

## 2023-07-25 HISTORY — PX: BIOPSY: SHX5522

## 2023-07-25 HISTORY — PX: ESOPHAGOGASTRODUODENOSCOPY (EGD) WITH PROPOFOL: SHX5813

## 2023-07-25 LAB — COMPREHENSIVE METABOLIC PANEL
ALT: 19 U/L (ref 0–44)
AST: 19 U/L (ref 15–41)
Albumin: 3.2 g/dL — ABNORMAL LOW (ref 3.5–5.0)
Alkaline Phosphatase: 64 U/L (ref 38–126)
Anion gap: 9 (ref 5–15)
BUN: 16 mg/dL (ref 8–23)
CO2: 25 mmol/L (ref 22–32)
Calcium: 9.2 mg/dL (ref 8.9–10.3)
Chloride: 104 mmol/L (ref 98–111)
Creatinine, Ser: 1.53 mg/dL — ABNORMAL HIGH (ref 0.61–1.24)
GFR, Estimated: 47 mL/min — ABNORMAL LOW (ref >60)
Glucose, Bld: 115 mg/dL — ABNORMAL HIGH (ref 70–99)
Potassium: 3.7 mmol/L (ref 3.5–5.1)
Sodium: 138 mmol/L (ref 135–145)
Total Bilirubin: 1.6 mg/dL — ABNORMAL HIGH (ref <1.2)
Total Protein: 6.5 g/dL (ref 6.5–8.1)

## 2023-07-25 LAB — ECHOCARDIOGRAM COMPLETE
AR max vel: 2.23 cm2
AV Area VTI: 2.13 cm2
AV Area mean vel: 2.06 cm2
AV Mean grad: 2 mmHg
AV Peak grad: 4.3 mmHg
Ao pk vel: 1.04 m/s
Area-P 1/2: 3.39 cm2
Height: 73 in
S' Lateral: 2.45 cm
Weight: 3638.4 [oz_av]

## 2023-07-25 LAB — CBC
HCT: 38.7 % — ABNORMAL LOW (ref 39.0–52.0)
Hemoglobin: 12.5 g/dL — ABNORMAL LOW (ref 13.0–17.0)
MCH: 30.3 pg (ref 26.0–34.0)
MCHC: 32.3 g/dL (ref 30.0–36.0)
MCV: 93.9 fL (ref 80.0–100.0)
Platelets: 243 10*3/uL (ref 150–400)
RBC: 4.12 MIL/uL — ABNORMAL LOW (ref 4.22–5.81)
RDW: 13.1 % (ref 11.5–15.5)
WBC: 8.8 10*3/uL (ref 4.0–10.5)
nRBC: 0 % (ref 0.0–0.2)

## 2023-07-25 SURGERY — ESOPHAGOGASTRODUODENOSCOPY (EGD) WITH PROPOFOL
Anesthesia: Monitor Anesthesia Care

## 2023-07-25 MED ORDER — PROPOFOL 500 MG/50ML IV EMUL
INTRAVENOUS | Status: DC | PRN
  Administered 2023-07-25: 135 ug/kg/min via INTRAVENOUS

## 2023-07-25 MED ORDER — EPHEDRINE SULFATE (PRESSORS) 50 MG/ML IJ SOLN
INTRAMUSCULAR | Status: DC | PRN
  Administered 2023-07-25: 5 mg via INTRAVENOUS

## 2023-07-25 MED ORDER — LIDOCAINE 2% (20 MG/ML) 5 ML SYRINGE
INTRAMUSCULAR | Status: DC | PRN
  Administered 2023-07-25: 60 mg via INTRAVENOUS

## 2023-07-25 MED ORDER — PHENYLEPHRINE 80 MCG/ML (10ML) SYRINGE FOR IV PUSH (FOR BLOOD PRESSURE SUPPORT)
PREFILLED_SYRINGE | INTRAVENOUS | Status: DC | PRN
  Administered 2023-07-25: 160 ug via INTRAVENOUS
  Administered 2023-07-25: 80 ug via INTRAVENOUS
  Administered 2023-07-25: 160 ug via INTRAVENOUS

## 2023-07-25 MED ORDER — SODIUM CHLORIDE 0.9 % IV SOLN: INTRAVENOUS | Status: DC | PRN

## 2023-07-25 MED ORDER — ACETAMINOPHEN 325 MG PO TABS: 650.0000 mg | ORAL_TABLET | Freq: Four times a day (QID) | ORAL | Status: DC | PRN

## 2023-07-25 MED ORDER — PERFLUTREN LIPID MICROSPHERE
3.0000 mL | INTRAVENOUS | Status: DC | PRN
  Administered 2023-07-25: 3 mL via INTRAVENOUS

## 2023-07-25 MED ORDER — PROPOFOL 10 MG/ML IV BOLUS
INTRAVENOUS | Status: DC | PRN
  Administered 2023-07-25: 40 mg via INTRAVENOUS

## 2023-07-25 SURGICAL SUPPLY — 14 items

## 2023-07-25 NOTE — Transfer of Care (Signed)
Immediate Anesthesia Transfer of Care Note  Patient: KYWON BOULANGER  Procedure(s) Performed: ESOPHAGOGASTRODUODENOSCOPY (EGD) WITH PROPOFOL BIOPSY  Patient Location: endo  Anesthesia Type:MAC  Level of Consciousness: sedated  Airway & Oxygen Therapy: Patient Spontanous Breathing  Post-op Assessment: Report given to RN and Post -op Vital signs reviewed and stable  Post vital signs: Reviewed and stable  Last Vitals:  Vitals Value Taken Time  BP    Temp    Pulse    Resp    SpO2      Last Pain:  Vitals:   07/25/23 1108  TempSrc: Temporal  PainSc: 0-No pain         Complications: No notable events documented.

## 2023-07-25 NOTE — Progress Notes (Signed)
Undergoing endoscopy Echocardiogram stable, see report.  Please call us if further assistance is necessary.

## 2023-07-25 NOTE — Op Note (Signed)
[facility] Patient Name: Martin [name] Procedure Date : 07/25/2023 [name]: [id number] Attending MD: Doristine Locks , MD, [id number] Date of Birth: Dec 21, 1945 CSN: [id number] Age: 77 Admit Type: Inpatient Procedure:                Upper GI endoscopy Indications:              Epigastric abdominal pain, Suspected esophageal                            reflux, Abnormal CT of the GI tract, Chest pain                            (non cardiac) Providers:                Doristine Locks, MD, Eliberto Ivory, RN, Kandice Robinsons, Technician Referring MD:              Medicines:                Monitored Anesthesia Care Complications:            No immediate complications. Estimated Blood Loss:     Estimated blood loss was minimal. Procedure:                Pre-Anesthesia Assessment:                           - Prior to the procedure, a History and Physical                            was performed, and patient medications and                            allergies were reviewed. The patient's tolerance of                            previous anesthesia was also reviewed. The risks                            and benefits of the procedure and the sedation                            options and risks were discussed with the patient.                            All questions were answered, and informed consent                            was obtained. Prior Anticoagulants: The patient has                            taken no anticoagulant or antiplatelet agents. ASA  Grade Assessment: III - A patient with severe                            systemic disease. After reviewing the risks and                            benefits, the patient was deemed in satisfactory                            condition to undergo the procedure.                           After obtaining informed consent, the endoscope was                            passed under direct  vision. Throughout the                            procedure, the patient's blood pressure, pulse, and                            oxygen saturations were monitored continuously. The                            GIF-H190 ([id number]) Olympus endoscope was introduced                            through the mouth, and advanced to the second part                            of duodenum. The upper GI endoscopy was                            accomplished without difficulty. The patient                            tolerated the procedure well. Scope In: Scope Out: Findings:      There were esophageal mucosal changes suspicious for long-segment       Barrett's esophagus, classified as Barrett's stage C2-M4 per [place]       criteria present in the lower third of the esophagus. The maximum       longitudinal extent of these mucosal changes was 4 cm in length. Mucosa       was biopsied with a cold forceps for histology in 4 quadrants at       intervals of 2 cm. A total of 3 specimen bottles were sent to pathology.       Estimated blood loss was minimal.      A 5 cm hiatal hernia was present.      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 The proximal extent of       the gastric folds (end of tubular esophagus) was 38 cm from the       incisors. The hiatal narrowing was 43 cm from the incisors.      Normal mucosa was found in the entire examined  stomach. Biopsies were       taken with a cold forceps for histology. Estimated blood loss was       minimal.      The examined duodenum was normal. Impression:               - Esophageal mucosal changes suspicious for                            long-segment Barrett's esophagus, classified as                            Barrett's stage C2-M4 per [place] criteria. Biopsied.                           - 5 cm hiatal hernia.                           - Large paraesophageal hernia.                           - Normal mucosa was found in the entire stomach.                             Biopsied.                           - Normal examined duodenum. Recommendation:           - Return patient to hospital ward for ongoing care.                           - Advance diet as tolerated.                           - Continue present medications.                           - Await pathology results.                           - Outpatient referral to [facility] (Bariatric                            Surgery or Cardiothoracic Surgery) appointment to                            be scheduled.                           - Inpatient GI service will sign off at this time.                            Please do not hesitate to contact us with                            additional questions or concerns. Procedure Code(s):        --- Professional ---  59563, Esophagogastroduodenoscopy, flexible,                            transoral; with biopsy, single or multiple Diagnosis Code(s):        --- Professional ---                           K22.70, Barrett's esophagus without dysplasia                           K44.9, Diaphragmatic hernia without obstruction or                            gangrene                           R10.13, Epigastric pain                           R07.89, Other chest pain                           R93.3, Abnormal findings on diagnostic imaging of                            other parts of digestive tract CPT copyright 2022 American Medical Association. All rights reserved. The codes documented in this report are preliminary and upon coder review may  be revised to meet current compliance requirements. Doristine Locks, MD 07/25/2023 12:37:47 PM Number of Addenda: 0

## 2023-07-25 NOTE — Anesthesia Postprocedure Evaluation (Signed)
Anesthesia Post Note  Patient: Martin [name]  Procedure(s) Performed: ESOPHAGOGASTRODUODENOSCOPY (EGD) WITH PROPOFOL BIOPSY     Patient location during evaluation: PACU Anesthesia Type: MAC Level of consciousness: awake and alert Pain management: pain level controlled Vital Signs Assessment: post-procedure vital signs reviewed and stable Respiratory status: spontaneous breathing Cardiovascular status: stable Anesthetic complications: no   No notable events documented.  Last Vitals:  Vitals:   07/25/23 1300 07/25/23 1339  BP: 118/67 131/61  Pulse: 65 69  Resp: 11 16  Temp:  36.9 C  SpO2: 92% 99%    Last Pain:  Vitals:   07/25/23 1339  TempSrc: Oral  PainSc:                  Lewie Loron

## 2023-07-25 NOTE — Discharge Summary (Signed)
DISCHARGE SUMMARY  Martin [name]  MR#: [id number]  DOB:1946/05/27  Date of Admission: 07/23/2023 Date of Discharge: 07/25/2023  Attending Physician:Cartel Mauss Silvestre Gunner, MD  Patient's NWG:NFAOZ, Martin Knuckles, MD  Disposition: D/C home   Follow-up Appts:  Follow-up Information     [facility], [place]. Schedule an appointment as soon as possible for a visit.   Specialty: General Surgery Why: Please call the office at the number listed here. The office will be expecting your call, and will help arrange an appoitment to see one of the hiatal hernia specialists. Contact information: [address] [place] [address] [phone]                Discharge Diagnoses: Epigastric/abdominal pain due to large hiatal hernia with refractory nausea and vomiting Mildly elevated troponins with a flat trend - no ACS Cholelithiasis w/o cholecystitis  Mild renal insufficiency due to dehydration  Normocytic anemia HTN History of ulcerative colitis HLD BPH  Initial presentation: 77 year old with a history of HTN, HLD, CAD status post CABG, anxiety/depression, BPH, and GERD who presented to the ER 11/21 with abdominal pain nausea and vomiting for at least 3 weeks.  This was exacerbated by eating and described as a dull pain or pressure in the epigastrium radiating up into his chest and into his shoulder blade after eating with subsequent emesis which often relieved his symptoms.  CXR noted a hiatal hernia.  CTa of the chest abdomen and pelvis noted a large hiatal hernia with intrathoracic stomach.   Hospital Course: The patient was admitted to the acute units for evaluation of his significant recurrent nausea vomiting and epigastric pain.  Given his cardiac history cardiology was consulted.  Please report trend was flat and his troponin was never significantly elevated.  An echocardiogram was accomplished and reviewed by cardiology who opined that no  further inpatient evaluation was indicated.  The patient was pain-free at the time of his discharge.  Given the findings of a significant hiatal hernia GI was consulted as this was felt to most likely be the etiology of his presenting symptoms.  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 No obstruction, ulcers, gastritis (biopsied stomach for H. pylori). Suspect he has pain driven by the hernia as food gets retained in there and either pain from stretch or pressure on the diaphragm. Recommend outpatient referral to either Bariatric Surgery or Cardiothoracic Surgery.   Post EGD the patient had no complaints.  He was able to tolerate a full liquid diet without difficulty.  He was counseled on the need for an outpatient surgical evaluation for definitive correction of his severe symptomatic HH.  The general surgery team was contacted and notified their office that the patient would be calling on Monday morning to arrange for an outpatient consultation.  The patient was otherwise stable and was happy to be discharged home.  No changes were made in his chronic medical regimen during this hospital stay.  Allergies as of 07/25/2023       Reactions   Morphine Other (See Comments)   Hot, sweats, flushed        Medication List     TAKE these medications    Asacol HD 800 MG Tbec Generic drug: Mesalamine Take 800 mg by mouth 3 (three) times daily.   clopidogrel 75 MG tablet Commonly known as: PLAVIX Take  75 mg by mouth daily.   loratadine 10 MG tablet Commonly known as: CLARITIN Take 10 mg by mouth daily.   metoprolol succinate 25 MG 24 hr tablet Commonly known as: TOPROL-XL TAKE 1/2 TABLET BY MOUTH EVERY DAY   nitroGLYCERIN 0.4 MG SL tablet Commonly known as: NITROSTAT Place 1 tablet (0.4 mg total) under the tongue every 5 (five) minutes  as needed for chest pain.   ondansetron 4 MG disintegrating tablet Commonly known as: ZOFRAN-ODT Take 1 tablet (4 mg total) by mouth every 8 (eight) hours as needed for nausea or vomiting.   pantoprazole 40 MG tablet Commonly known as: PROTONIX Take 40 mg by mouth daily.   rosuvastatin 40 MG tablet Commonly known as: CRESTOR Take 1 tablet (40 mg total) by mouth daily.   tamsulosin 0.4 MG Caps capsule Commonly known as: FLOMAX Take 0.4 mg by mouth daily.   traZODone 50 MG tablet Commonly known as: DESYREL Take 50 mg by mouth at bedtime as needed for sleep.        Day of Discharge BP 131/61 (BP Location: Left Arm)   Pulse 69   Temp 98.4 F (36.9 C) (Oral)   Resp 16   Ht 6\' 1"  (1.854 m)   Wt 103.1 kg   SpO2 99%   BMI 29.99 kg/m   Physical Exam: General: No acute respiratory distress Lungs: Clear to auscultation bilaterally without wheezes or crackles Cardiovascular: Regular rate and rhythm without murmur gallop or rub normal S1 and S2 Abdomen: Nontender, nondistended, soft, bowel sounds positive, no rebound, no ascites, no appreciable mass Extremities: No significant cyanosis, clubbing, or edema bilateral lower extremities  Basic Metabolic Panel: Recent Labs  Lab 07/23/23 1749 07/25/23 0327  NA 140 138  K 4.5 3.7  CL 104 104  CO2 27 25  GLUCOSE 108* 115*  BUN 20 16  CREATININE 1.42* 1.53*  CALCIUM 9.6 9.2    CBC: Recent Labs  Lab 07/23/23 1749 07/25/23 0327  WBC 9.7 8.8  HGB 12.4* 12.5*  HCT 39.9 38.7*  MCV 96.8 93.9  PLT 278 243    Time spent in discharge (includes decision making & examination of pt): 35 minutes  07/25/2023, 3:13 PM   Lonia Blood, MD [facility] Office  [phone]

## 2023-07-25 NOTE — Interval H&P Note (Signed)
History and Physical Interval Note:  07/25/2023 11:43 AM  Martin [name]  has presented today for surgery, with the diagnosis of intrathoracic stomach, recurrent abd pain, N/V.  The various methods of treatment have been discussed with the patient and family. After consideration of risks, benefits and other options for treatment, the patient has consented to  Procedure(s): ESOPHAGOGASTRODUODENOSCOPY (EGD) WITH PROPOFOL (N/A) as a surgical intervention.  The patient's history has been reviewed, patient examined, no change in status, stable for surgery.  I have reviewed the patient's chart and labs.  Questions were answered to the patient's satisfaction.     Verlin Dike Nasiah Lehenbauer

## 2023-07-25 NOTE — Care Management Obs Status (Signed)
MEDICARE OBSERVATION STATUS NOTIFICATION   Patient Details  Name: Martin [name] [name]: [id number] Date of Birth: March 28, 1946   Medicare Observation Status Notification Given:  Yes    Ronny Bacon, RN 07/25/2023, 6:50 AM

## 2023-07-26 ENCOUNTER — Encounter (HOSPITAL_COMMUNITY): Payer: Self-pay | Admitting: Gastroenterology

## 2023-07-27 ENCOUNTER — Other Ambulatory Visit: Payer: Self-pay | Admitting: *Deleted

## 2023-07-27 DIAGNOSIS — I739 Peripheral vascular disease, unspecified: Secondary | ICD-10-CM

## 2023-07-28 LAB — SURGICAL PATHOLOGY

## 2023-08-04 ENCOUNTER — Telehealth: Payer: Self-pay | Admitting: Gastroenterology

## 2023-08-04 NOTE — Telephone Encounter (Signed)
Inbound call from patient's wife, requesting a call in regards to pathology results. She states patient does not have access to MyChart

## 2023-08-04 NOTE — Telephone Encounter (Signed)
Attempted to reach patient's wife. No answer. Phone eventually leads to fast busy signal. Will attempt to reach back at a later time.

## 2023-08-05 NOTE — Telephone Encounter (Signed)
I have spoken to patient's wife, Okey Regal (ok per [facility]) to advise of results and recommendations from recent endoscopy as per Dr Barron Alvine. Advised to continue pantoprazole for acid suppression, to keep appointment with Surgery and to follow up with Dr Chales Abrahams in the office on 08/19/23. She verbalizes understanding of this information.

## 2023-08-10 ENCOUNTER — Ambulatory Visit (HOSPITAL_COMMUNITY)
Admission: RE | Admit: 2023-08-10 | Discharge: 2023-08-10 | Disposition: A | Discharge status: Home / Self Care | Payer: Medicare PPO | Source: Ambulatory Visit | Attending: Surgery | Admitting: Surgery

## 2023-08-10 ENCOUNTER — Ambulatory Visit: Payer: Medicare PPO | Admitting: Surgery

## 2023-08-10 ENCOUNTER — Encounter: Payer: Self-pay | Admitting: Surgery

## 2023-08-10 ENCOUNTER — Ambulatory Visit (INDEPENDENT_AMBULATORY_CARE_PROVIDER_SITE_OTHER)
Admission: RE | Admit: 2023-08-10 | Discharge: 2023-08-10 | Disposition: A | Discharge status: Home / Self Care | Payer: Medicare PPO | Source: Ambulatory Visit | Attending: Surgery | Admitting: Surgery

## 2023-08-10 VITALS — BP 114/74 | HR 68 | Temp 98.0°F | Resp 20 | Ht 73.0 in | Wt 226.0 lb

## 2023-08-10 DIAGNOSIS — I6523 Occlusion and stenosis of bilateral carotid arteries: Secondary | ICD-10-CM | POA: Insufficient documentation

## 2023-08-10 DIAGNOSIS — M7989 Other specified soft tissue disorders: Secondary | ICD-10-CM | POA: Diagnosis not present

## 2023-08-10 DIAGNOSIS — I739 Peripheral vascular disease, unspecified: Secondary | ICD-10-CM | POA: Insufficient documentation

## 2023-08-10 LAB — VAS US ABI WITH/WO TBI
Left ABI: 0.97
Right ABI: 1.06

## 2023-08-10 NOTE — Progress Notes (Signed)
[facility] [place]  Patient name: Martin [name] [name]: [id number] DOB: [birth year] Sex: male   REASON FOR VISIT:    Follow up  HISOTRY OF PRESENT ILLNESS:   Martin [name] is a 77 y.o. male, who is referred for evaluation of disease.  Patient states that since his CABG, he has not had issues with balance.  He does have a MRI from 2023 that shows a moderate-sized chronic right frontal, temporal, and occipital infarct.  He also has a record of having blurry vision and eye issues on the left that could potentially have represented TIAs.  The patient does not remember these and certainly has not had any recently.  It sounds like these occurred around the time of his cardiac surgery.  He has undergone heart monitor testing which has been unremarkable.  He denies any acute neurologic symptoms.  Specifically, he denies numbness or weakness in either extremity.  He denies slurred speech.  He denies amaurosis fugax.     Patient has a history of coronary artery disease, status post stenting and CABG he is a non-smoker.  He is medically managed for hypertension.  He takes a statin for hypercholesterolemia.  He has ulcerative colitis.  He also complains of some left hip pain.  He walks approximately 01-6999 steps a day   PAST MEDICAL HISTORY:   Past Medical History:  Diagnosis Date   Anxiety and depression    Benign prostatic hypertrophy    Coronary artery disease    cath 04/2007 with 90-95% RCA stenosis. Had DES placed.  Had an acute stent thrombosis in the hospital that day and subsequently had 3 DES placed in the RCA. The LAD had 45% stenosis, in the circ had 35% stenosis. The EF was well preserved.   Dyslipidemia    GERD (gastroesophageal reflux disease)    Hypercholesterolemia    Insomnia    Ulcerative colitis      FAMILY HISTORY:   Family History  Problem Relation Age of Onset   Heart attack Mother    Heart attack Father     Heart attack Brother    Colon cancer Neg Hx    Rectal cancer Neg Hx    Stomach cancer Neg Hx     SOCIAL HISTORY:   Social History   Tobacco Use   Smoking status: Never   Smokeless tobacco: Never  Substance Use Topics   Alcohol use: Yes    Alcohol/week: 1.0 standard drink of alcohol    Types: 1 Cans of beer per week     ALLERGIES:   Allergies  Allergen Reactions   Morphine Other (See Comments)    Hot, sweats, flushed     CURRENT MEDICATIONS:   Current Outpatient Medications  Medication Sig Dispense Refill   ASACOL HD 800 MG TBEC Take 800 mg by mouth 3 (three) times daily.  3   clopidogrel (PLAVIX) 75 MG tablet Take 75 mg by mouth daily.     loratadine (CLARITIN) 10 MG tablet Take 10 mg by mouth daily.     metoprolol succinate (TOPROL-XL) 25 MG 24 hr tablet TAKE 1/2 TABLET BY MOUTH EVERY DAY 45 tablet 3   nitroGLYCERIN (NITROSTAT) 0.4 MG SL tablet Place 1 tablet (0.4 mg total) under the tongue every 5 (five) minutes as needed for chest pain. 25 tablet 3   ondansetron (ZOFRAN-ODT) 4 MG disintegrating tablet Take 1 tablet (4 mg total) by mouth every 8 (eight) hours as needed for nausea or vomiting. 20  tablet 2   pantoprazole (PROTONIX) 40 MG tablet Take 40 mg by mouth daily.     rosuvastatin (CRESTOR) 40 MG tablet Take 1 tablet (40 mg total) by mouth daily. 90 tablet 3   tamsulosin (FLOMAX) 0.4 MG CAPS capsule Take 0.4 mg by mouth daily.     traZODone (DESYREL) 50 MG tablet Take 50 mg by mouth at bedtime as needed for sleep.     No current facility-administered medications for this visit.    REVIEW OF SYSTEMS:   [X]  denotes positive finding, [ ]  denotes negative finding Cardiac  Comments:  Chest pain or chest pressure:    Shortness of breath upon exertion:    Short of breath when lying flat:    Irregular heart rhythm:        Vascular    Pain in calf, thigh, or hip brought on by ambulation:    Pain in feet at night that wakes you up from your sleep:     Blood  clot in your veins:    Leg swelling:         Pulmonary    Oxygen at home:    Productive cough:     Wheezing:         Neurologic    Sudden weakness in arms or legs:     Sudden numbness in arms or legs:     Sudden onset of difficulty speaking or slurred speech:    Temporary loss of vision in one eye:     Problems with dizziness:         Gastrointestinal    Blood in stool:     Vomited blood:         Genitourinary    Burning when urinating:     Blood in urine:        Psychiatric    Major depression:         Hematologic    Bleeding problems:    Problems with blood clotting too easily:        Skin    Rashes or ulcers:        Constitutional    Fever or chills:      PHYSICAL EXAM:   Vitals:   08/10/23 1036 08/10/23 1038  BP: 113/71 114/74  Pulse: 68   Resp: 20   Temp: 98 F (36.7 C)   SpO2: 93%   Weight: 226 lb (102.5 kg)   Height: 6\' 1"  (1.854 m)     GENERAL: The patient is a well-nourished male, in no acute distress. The vital signs are documented above. CARDIAC: There is a regular rate and rhythm.  VASCULAR: Bilateral edema PULMONARY: Non-labored respirations ABDOMEN: Soft and non-tender with normal pitched bowel sounds.  MUSCULOSKELETAL: There are no major deformities or cyanosis. NEUROLOGIC: No focal weakness or paresthesias are detected. SKIN: There are no ulcers or rashes noted. PSYCHIATRIC: The patient has a normal affect.  STUDIES:   I have reviewed the following: ABI/TBIToday's ABIToday's TBIPrevious ABIPrevious TBI  +-------+-----------+-----------+------------+------------+  Right 1.06       0.70                                 +-------+-----------+-----------+------------+------------+  Left  0.97       0.71                                 +-------+-----------+-----------+------------+------------+  Waveforms are triphasic  Right Carotid: Velocities in the right ICA are consistent with a 60-79%                 stenosis.    Left Carotid: Velocities in the left ICA are consistent with a 60-79%  stenosis.   Vertebrals: Bilateral vertebral arteries demonstrate antegrade flow.  Subclavians: Right subclavian artery was stenotic. Normal flow  hemodynamics were               seen in the left subclavian artery.    MEDICAL ISSUES:   Carotid: He remains asymptomatic.  Ultrasound shows 60 to 79% bilateral stenosis which is unchanged.  Because he has bilateral high-grade lesions, I will check a CT angiogram when he returns in 6 months.  He knows to contact me sooner if he develops symptoms  Leg swelling: I have encouraged him to wear 20-30 compression socks.  ABIs were normal today    Durene Cal, IV, MD, FACS [facility] [phone] Pager [phone]

## 2023-08-14 ENCOUNTER — Telehealth: Payer: Self-pay | Admitting: Gastroenterology

## 2023-08-14 NOTE — Telephone Encounter (Signed)
Called the telephone number given. Left message.  Want to be certain they were seeking clearance from GI for this patient. If yes, asked the form be faxed to [phone].

## 2023-08-14 NOTE — Telephone Encounter (Signed)
Inbound call from [facility] stating they need clearance for patient to have cataract surgery. States they have sent over request for clearance. Call back number is [phone]. Please advise, thank you.

## 2023-08-19 ENCOUNTER — Ambulatory Visit: Payer: Medicare PPO | Admitting: Gastroenterology

## 2023-08-19 ENCOUNTER — Encounter: Payer: Self-pay | Admitting: Gastroenterology

## 2023-08-19 VITALS — BP 128/72 | HR 65 | Ht 73.0 in | Wt 228.2 lb

## 2023-08-19 DIAGNOSIS — K219 Gastro-esophageal reflux disease without esophagitis: Secondary | ICD-10-CM | POA: Diagnosis not present

## 2023-08-19 DIAGNOSIS — R1012 Left upper quadrant pain: Secondary | ICD-10-CM | POA: Diagnosis not present

## 2023-08-19 DIAGNOSIS — Z8719 Personal history of other diseases of the digestive system: Secondary | ICD-10-CM

## 2023-08-19 MED ORDER — PANTOPRAZOLE SODIUM 40 MG PO TBEC: 40.0000 mg | DELAYED_RELEASE_TABLET | Freq: Two times a day (BID) | ORAL | 6 refills | Status: DC

## 2023-08-19 NOTE — Progress Notes (Addendum)
Chief Complaint: For colon  Referring Provider:  [name], MD      ASSESSMENT AND PLAN;   #1. LUQ/atypical noncardiac chest pains with intermittent N/V.  Likely etiology -large 5 cm paraesophageal hernia. Seen by Dr. Derrell Lolling [facility] surgery).  Advised robotic hernia repair.  Pt declined surgery currently. US/CT-asymptomatic cholelithiasis  #2. GERD with 5 cm HH and Barrett's esophagus. Rpt EGD 07/2026.   #3. H/O pan UC. Dx 1990s  #4. H/O CAD, TIA/prev CVA on ASA/plavix.   Plan: -Small but frequent meals -Increased pantaprazole 40 mg p.o. BID #60, 6RF -Continue Asacol for now -Wt loss.  Moving near [place] in [place].  Encouraged him to join [employer]. -D/W Milbert Coulter (RN) -Nonpharmacologic means of reflux control. -FU in 4 months.  HPI:    Martin [name] is a 77 y.o. male  With TIA, H/O CVA (detected via MRI), B/L carotid artery stenosis on ASA/Plavix (started Jun 05, 2022 by neurology), CAD s/p CABG (EF 65%), GERD, HTN, HLD, anxiety/depression, hard of hearing, peripheral arterial disease.  Accompanied by his wife Becket Cianciulli (RN)  Adm to [facility] with epi/LUQ abdo/chest pain.  Negative cardiac evaluation.  EGD revealed large hiatal hernia with Barrett's esophagus.  CT Abdo/pelvis confirmed 4 cm HH with one third of the stomach in the chest.  Seen by Dr. Derrell Lolling on 08/11/2023 and advised robotic hiatal hernia repair with fundoplication.  Patient wants to try medical treatment as he is concerned about stroke.  Doing much better except for intermittent episodes of fullness, chest pains occurring 2-3 times per week  No fever chills or night sweats.  No recent weight loss.  Constipation is better.  No melena or hematochezia.  No odynophagia or dysphagia.  Compliant with Asacol for remote history of ulcerative colitis in 1990s    Previous GI procedures: Colonoscopy 10/06/2016: ([facility]) -Colon polyps s/p polypectomy. Bx-hyperplastic -Previously diagnosed UC.  Inactive  endoscopically.  However, Bx-chronic inactive colitis.  EGD 07/25/2023 - Esophageal mucosal changes suspicious for long- segment Barrett' s esophagus, classified as Barrett' s stage C2- M4 per [place] criteria. Biopsied. - 5 cm hiatal hernia. - Large paraesophageal hernia. - Normal mucosa was found in the entire stomach. Biopsied. - Normal examined duodenum. -Bx-Barrett's without dysplasia -Rpt EGD in 3 yrs  CT chest/Abdo/pelvis 07/24/2023 1. Atherosclerotic calcification with no evidence of aneurysm or dissection. 2. Large hiatal hernia with the stomach intrathoracic in location on the left. 3. Cholelithiasis. 4. Emphysema.  [place] 07/24/2023-cholelithiasis without cholecystitis  Past Medical History:  Diagnosis Date   Anxiety and depression    Benign prostatic hypertrophy    Coronary artery disease    cath 04/2007 with 90-95% RCA stenosis. Had DES placed.  Had an acute stent thrombosis in the hospital that day and subsequently had 3 DES placed in the RCA. The LAD had 45% stenosis, in the circ had 35% stenosis. The EF was well preserved.   Dyslipidemia    GERD (gastroesophageal reflux disease)    Hypercholesterolemia    Insomnia    Ulcerative colitis     Past Surgical History:  Procedure Laterality Date   BIOPSY  07/25/2023   Procedure: BIOPSY;  Surgeon: Shellia Cleverly, DO;  Location: [facility] ENDOSCOPY;  Service: Gastroenterology;;  Barretts and H.Pylori r/o   COLONOSCOPY  10/06/2016   Colonic polyps status post polypectomy. Previously diagnosed left sided ulcerative colitis now wiith only smudged vascular pattern (inactive endoscopically)   CORONARY ARTERY BYPASS GRAFT N/A 11/21/2020   Procedure: CORONARY ARTERY BYPASS GRAFTING (  CABG) TIMES FOUR ON PUMP USING BILATERAL INTERNAL MAMMARY ARTERIES AND LEFT RADIAL ARTERY;  Surgeon: [name], MD;  Location: [facility] OR;  Service: Open Heart Surgery;  Laterality: N/A;  BIMA   ESOPHAGOGASTRODUODENOSCOPY (EGD) WITH PROPOFOL N/A 07/25/2023    Procedure: ESOPHAGOGASTRODUODENOSCOPY (EGD) WITH PROPOFOL;  Surgeon: Shellia Cleverly, DO;  Location: [facility] ENDOSCOPY;  Service: Gastroenterology;  Laterality: N/A;   INGUINAL HERNIA REPAIR  1999   LEFT HEART CATH AND CORONARY ANGIOGRAPHY N/A 11/20/2020   Procedure: LEFT HEART CATH AND CORONARY ANGIOGRAPHY;  Surgeon: Lennette Bihari, MD;  Location: [facility] INVASIVE CV LAB;  Service: Cardiovascular;  Laterality: N/A;   RADIAL ARTERY HARVEST Left 11/21/2020   Procedure: RADIAL ARTERY HARVEST;  Surgeon: [name], MD;  Location: [facility] OR;  Service: Open Heart Surgery;  Laterality: Left;   RIGHT HEART CATH N/A 12/05/2020   Procedure: RIGHT HEART CATH;  Surgeon: Dolores Patty, MD;  Location: [facility] INVAS[facility] CV [facility];  Service: Cardiovascular;  Laterality: N/A;   ROTATOR CUFF REPAIR Left    TEE WITHOUT CARDIOVERSION N/A 11/21/2020   Procedure: TRANSESOPHAGEAL ECHOCARDIOGRAM (TEE);  Surgeon: [name], MD;  Location: [facility] OR;  Service: Open Heart Surgery;  Laterality: N/A;    Family History  Problem Relation Age of Onset   Heart attack Mother    Heart attack Father    Heart attack Brother    Colon cancer Neg Hx    Rectal cancer Neg Hx    Stomach cancer Neg Hx     Social History   Tobacco Use   Smoking status: Never   Smokeless tobacco: Never  Vaping Use   Vaping status: Never Used  Substance Use Topics   Alcohol use: Yes    Alcohol/week: 1.0 standard drink of alcohol    Types: 1 Cans of beer per week   Drug use: Never    Current Outpatient Medications  Medication Sig Dispense Refill   ASACOL HD 800 MG TBEC Take 800 mg by mouth 3 (three) times daily.  3   clopidogrel (PLAVIX) 75 MG tablet Take 75 mg by mouth daily.     loratadine (CLARITIN) 10 MG tablet Take 10 mg by mouth daily.     metoprolol succinate (TOPROL-XL) 25 MG 24 hr tablet TAKE 1/2 TABLET BY MOUTH EVERY DAY 45 tablet 3   nitroGLYCERIN (NITROSTAT) 0.4 MG SL tablet Place 1 tablet (0.4 mg total) under the tongue  every 5 (five) minutes as needed for chest pain. 25 tablet 3   ondansetron (ZOFRAN-ODT) 4 MG disintegrating tablet Take 1 tablet (4 mg total) by mouth every 8 (eight) hours as needed for nausea or vomiting. 20 tablet 2   pantoprazole (PROTONIX) 40 MG tablet Take 40 mg by mouth daily.     rosuvastatin (CRESTOR) 40 MG tablet Take 1 tablet (40 mg total) by mouth daily. 90 tablet 3   tamsulosin (FLOMAX) 0.4 MG CAPS capsule Take 0.4 mg by mouth daily.     traZODone (DESYREL) 50 MG tablet Take 50 mg by mouth at bedtime as needed for sleep.     No current facility-administered medications for this visit.    Allergies  Allergen Reactions   Morphine Other (See Comments)    Hot, sweats, flushed    Review of Systems:  Psychiatric/Behavioral: Has anxiety or depression     Physical Exam:    BP 128/72   Pulse 65   Ht 6\' 1"  (1.854 m)   Wt 228 lb 4 oz (103.5 kg)  SpO2 95%   BMI 30.11 kg/m  Wt Readings from Last 3 Encounters:  08/19/23 228 lb 4 oz (103.5 kg)  08/10/23 226 lb (102.5 kg)  07/25/23 227 lb 4.7 oz (103.1 kg)   Constitutional:  Well-developed, in no acute distress. Psychiatric: Normal mood and affect. Behavior is normal. Cardiovascular: Normal rate, regular rhythm. No edema Pulmonary/chest: Effort normal and breath sounds normal. No wheezing, rales or rhonchi. Abdominal: Soft, nondistended. Nontender. Bowel sounds active throughout. There are no masses palpable. No hepatomegaly. Rectal: Deferred Neurological: Alert and oriented to person place and time. Skin: Skin is warm and dry. No rashes noted.    Edman Circle, MD 08/19/2023, 9:48 AM  Cc: [name], MD

## 2023-08-19 NOTE — Patient Instructions (Addendum)
Follow up in 4 months.  We have sent the following medications to your pharmacy for you to pick up at your convenience: Pantoprazole 40 mg twice daily   If your blood pressure at your visit was 140/90 or greater, please contact your primary care physician to follow up on this.  _______________________________________________________  If you are age 77 or older, your body mass index should be between 23-30. Your Body mass index is 30.11 kg/m. If this is out of the aforementioned range listed, please consider follow up with your Primary Care Provider.  If you are age 59 or younger, your body mass index should be between 19-25. Your Body mass index is 30.11 kg/m. If this is out of the aformentioned range listed, please consider follow up with your Primary Care Provider.  ________________________________________________________  The [facility] providers would like to encourage you to use [facility] to communicate with providers for non-urgent requests or questions.  Due to long hold times on the telephone, sending your provider a message by [facility] may be a faster and more efficient way to get a response.  Please allow 48 business hours for a response.  Please remember that this is for non-urgent requests.  _______________________________________________________  Thank you,  Dr. Lynann Bologna

## 2023-09-05 ENCOUNTER — Encounter (HOSPITAL_COMMUNITY): Payer: Self-pay

## 2023-09-05 ENCOUNTER — Emergency Department (HOSPITAL_COMMUNITY)
Admission: EM | Admit: 2023-09-05 | Discharge: 2023-09-05 | Disposition: A | Discharge status: Home / Self Care | Payer: Medicare PPO | Attending: Emergency Medicine | Admitting: Emergency Medicine

## 2023-09-05 ENCOUNTER — Other Ambulatory Visit: Payer: Self-pay

## 2023-09-05 DIAGNOSIS — R1084 Generalized abdominal pain: Secondary | ICD-10-CM | POA: Insufficient documentation

## 2023-09-05 DIAGNOSIS — R112 Nausea with vomiting, unspecified: Secondary | ICD-10-CM | POA: Insufficient documentation

## 2023-09-05 LAB — URINALYSIS, ROUTINE W REFLEX MICROSCOPIC
Bilirubin Urine: NEGATIVE
Glucose, UA: NEGATIVE mg/dL
Hgb urine dipstick: NEGATIVE
Ketones, ur: NEGATIVE mg/dL
Nitrite: NEGATIVE
Protein, ur: 100 mg/dL — AB
Specific Gravity, Urine: 1.024 (ref 1.005–1.030)
pH: 5 (ref 5.0–8.0)

## 2023-09-05 LAB — COMPREHENSIVE METABOLIC PANEL
ALT: 19 U/L (ref 0–44)
AST: 19 U/L (ref 15–41)
Albumin: 3.5 g/dL (ref 3.5–5.0)
Alkaline Phosphatase: 77 U/L (ref 38–126)
Anion gap: 13 (ref 5–15)
BUN: 11 mg/dL (ref 8–23)
CO2: 24 mmol/L (ref 22–32)
Calcium: 9.5 mg/dL (ref 8.9–10.3)
Chloride: 102 mmol/L (ref 98–111)
Creatinine, Ser: 1.33 mg/dL — ABNORMAL HIGH (ref 0.61–1.24)
GFR, Estimated: 55 mL/min — ABNORMAL LOW (ref >60)
Glucose, Bld: 106 mg/dL — ABNORMAL HIGH (ref 70–99)
Potassium: 3.1 mmol/L — ABNORMAL LOW (ref 3.5–5.1)
Sodium: 139 mmol/L (ref 135–145)
Total Bilirubin: 2.4 mg/dL — ABNORMAL HIGH (ref 0.0–1.2)
Total Protein: 7.4 g/dL (ref 6.5–8.1)

## 2023-09-05 LAB — CBC
HCT: 42.2 % (ref 39.0–52.0)
Hemoglobin: 13.5 g/dL (ref 13.0–17.0)
MCH: 30.3 pg (ref 26.0–34.0)
MCHC: 32 g/dL (ref 30.0–36.0)
MCV: 94.8 fL (ref 80.0–100.0)
Platelets: 292 10*3/uL (ref 150–400)
RBC: 4.45 MIL/uL (ref 4.22–5.81)
RDW: 13 % (ref 11.5–15.5)
WBC: 12.1 10*3/uL — ABNORMAL HIGH (ref 4.0–10.5)
nRBC: 0 % (ref 0.0–0.2)

## 2023-09-05 LAB — LIPASE, BLOOD: Lipase: 29 U/L (ref 11–51)

## 2023-09-05 MED ORDER — METOCLOPRAMIDE HCL 10 MG PO TABS: 10.0000 mg | ORAL_TABLET | Freq: Four times a day (QID) | ORAL | 0 refills | Status: DC

## 2023-09-05 MED ORDER — LACTATED RINGERS IV BOLUS
1000.0000 mL | Freq: Once | INTRAVENOUS | Status: AC
  Administered 2023-09-05: 1000 mL via INTRAVENOUS

## 2023-09-05 MED ORDER — POTASSIUM CHLORIDE 10 MEQ/100ML IV SOLN
10.0000 meq | Freq: Once | INTRAVENOUS | Status: AC
Start: 2023-09-05 — End: 2023-09-05
  Administered 2023-09-05: 10 meq via INTRAVENOUS
  Filled 2023-09-05: qty 100

## 2023-09-05 MED ORDER — METOCLOPRAMIDE HCL 5 MG/ML IJ SOLN
10.0000 mg | Freq: Once | INTRAMUSCULAR | Status: AC
  Administered 2023-09-05: 10 mg via INTRAVENOUS
  Filled 2023-09-05: qty 2

## 2023-09-05 NOTE — ED Notes (Signed)
Patient given ice water and crackers

## 2023-09-05 NOTE — ED Notes (Signed)
 PO challenge successful. Denies nausea/vomiting or pain.

## 2023-09-05 NOTE — ED Provider Notes (Signed)
 [facility] AT [facility] Provider Note   CSN: [id number] Arrival date & time: 09/05/23  1332     History Chief Complaint  Patient presents with   Abdominal Pain    HPI CHRSTOPHER MALENFANT is a 78 y.o. male presenting for abdominal pain.  78 year old male.  Well-known to gastroenterology and general surgery.  Large paraesophageal hernia and hiatal effect.  He also has cholelithiasis.  States that he has been seen by both specialist for p.o. intolerance.  They have recommended smaller meals and medical therapy.  He is stating that he is failing these therapies after 2 weeks of trial and today he could not keep anything down.  Denies fevers chills syncope shortness of breath.  He is having substantial abdominal pain after meals.  Patient's recorded medical, surgical, social, medication list and allergies were reviewed in the Snapshot window as part of the initial history.   Review of Systems   Review of Systems  Constitutional:  Negative for chills and fever.  HENT:  Negative for ear pain and sore throat.   Eyes:  Negative for pain and visual disturbance.  Respiratory:  Negative for cough and shortness of breath.   Cardiovascular:  Negative for chest pain and palpitations.  Gastrointestinal:  Positive for abdominal pain, nausea and vomiting.  Genitourinary:  Negative for dysuria and hematuria.  Musculoskeletal:  Negative for arthralgias and back pain.  Skin:  Negative for color change and rash.  Neurological:  Negative for seizures and syncope.  All other systems reviewed and are negative.   Physical Exam Updated Vital Signs BP (!) 141/73   Pulse 72   Temp 98.4 F (36.9 C) (Oral)   Resp 16   Ht 6' 1 (1.854 m)   Wt 95.3 kg   SpO2 98%   BMI 27.71 kg/m  Physical Exam Vitals and nursing note reviewed.  Constitutional:      General: He is not in acute distress.    Appearance: He is well-developed.  HENT:     Head: Normocephalic and atraumatic.   Eyes:     Conjunctiva/sclera: Conjunctivae normal.  Cardiovascular:     Rate and Rhythm: Normal rate and regular rhythm.     Heart sounds: No murmur heard. Pulmonary:     Effort: Pulmonary effort is normal. No respiratory distress.     Breath sounds: Normal breath sounds.  Abdominal:     Palpations: Abdomen is soft.     Tenderness: There is generalized abdominal tenderness. There is no guarding.  Musculoskeletal:        General: No swelling.     Cervical back: Neck supple.  Skin:    General: Skin is warm and dry.     Capillary Refill: Capillary refill takes less than 2 seconds.  Neurological:     Mental Status: He is alert.  Psychiatric:        Mood and Affect: Mood normal.      ED Course/ Medical Decision Making/ A&P    Procedures Procedures   Medications Ordered in ED Medications  metoCLOPramide  (REGLAN ) injection 10 mg (10 mg Intravenous Given 09/05/23 1716)  lactated ringers  bolus 1,000 mL (0 mLs Intravenous Stopped 09/05/23 1925)  potassium chloride  10 mEq in 100 mL IVPB (0 mEq Intravenous Stopped 09/05/23 1839)   Medical Decision Making:   TENNESSEE PERRA is a 78 y.o. male who presented to the ED today with abdominal pain, detailed above.    Complete initial physical exam performed, notably the  patient  was HDS in NAD.     Reviewed and confirmed nursing documentation for past medical history, family history, social history.    Initial Assessment:   With the patient's presentation of abdominal pain, most likely diagnosis is nonspecific etiology. Other diagnoses were considered including (but not limited to) gastroenteritis, colitis, small bowel obstruction, appendicitis, cholecystitis, pancreatitis, nephrolithiasis, UTI, pyleonephritis. These are considered less likely due to history of present illness and physical exam findings.   This is most consistent with an acute life/limb threatening illness complicated by underlying chronic conditions.   Initial Plan:  CBC/CMP  to evaluate for underlying infectious/metabolic etiology for patient's abdominal pain  Lipase to evaluate for pancreatitis  EKG to evaluate for cardiac source of pain  Urinalysis and repeat physical assessment to evaluate for UTI/Pyelonpehritis  Empiric management of symptoms with escalating pain control and antiemetics as needed.   Initial Study Results:   Laboratory  All laboratory results reviewed without evidence of clinically relevant pathology.    EKG EKG was reviewed independently. Rate, rhythm, axis, intervals all examined and without medically relevant abnormality. ST segments without concerns for elevations.    Radiology All images reviewed independently. Agree with radiology report at this time.   VAS US  CAROTID Result Date: 08/10/2023 Carotid Arterial Duplex Study Patient Name:  MARQUINN MESCHKE  Date of Exam:   08/10/2023 Medical Rec #: [id number]       Accession #:    [id number] Date of Birth: 03-23-1946       Patient Gender: M Patient Age:   59 years Exam Location:  [facility] Procedure:      VAS US  CAROTID Referring Phys: GAILE NEW --------------------------------------------------------------------------------  Indications:       Carotid artery disease. Risk Factors:      Hyperlipidemia, coronary artery disease. Other Factors:     Hx CABG x4. Comparison Study:  02/03/23 prior Performing Technologist: Duwaine Hives RVS  Examination Guidelines: A complete evaluation includes B-mode imaging, spectral Doppler, color Doppler, and power Doppler as needed of all accessible portions of each vessel. Bilateral testing is considered an integral part of a complete examination. Limited examinations for reoccurring indications may be performed as noted.  Right Carotid Findings: +---------+--------+-------+--------+---------------------------------+--------+          PSV cm/sEDV    StenosisPlaque Description               Comments                  cm/s                                                      +---------+--------+-------+--------+---------------------------------+--------+ CCA Prox 121     17             heterogenous                              +---------+--------+-------+--------+---------------------------------+--------+ CCA      87      16             heterogenous                              Distal                                                                    +---------+--------+-------+--------+---------------------------------+--------+  ICA Prox 233     71     60-79%  heterogenous, calcific and                                                irregular                                 +---------+--------+-------+--------+---------------------------------+--------+ ICA Mid  202     60                                                       +---------+--------+-------+--------+---------------------------------+--------+ ICA      176     35                                                       Distal                                                                    +---------+--------+-------+--------+---------------------------------+--------+ ECA      189                                                              +---------+--------+-------+--------+---------------------------------+--------+ +----------+--------+-------+--------+-------------------+           PSV cm/sEDV cmsDescribeArm Pressure (mmHG) +----------+--------+-------+--------+-------------------+ [id number]            Stenotic                    +----------+--------+-------+--------+-------------------+ +---------+--------+--+--------+--+---------+ VertebralPSV cm/s50EDV cm/s12Antegrade +---------+--------+--+--------+--+---------+  Left Carotid Findings: +---------+--------+-------+--------+---------------------------------+--------+          PSV cm/sEDV    StenosisPlaque Description                Comments                  cm/s                                                     +---------+--------+-------+--------+---------------------------------+--------+ CCA Prox 155     21                                                       +---------+--------+-------+--------+---------------------------------+--------+ CCA      87  19                                                       Distal                                                                    +---------+--------+-------+--------+---------------------------------+--------+ ICA Prox 228     66     60-79%  heterogenous, calcific and                                                irregular                                 +---------+--------+-------+--------+---------------------------------+--------+ ICA Mid  150     36                                                       +---------+--------+-------+--------+---------------------------------+--------+ ICA      87      21                                                       Distal                                                                    +---------+--------+-------+--------+---------------------------------+--------+ ECA      184                                                              +---------+--------+-------+--------+---------------------------------+--------+ +----------+--------+--------+----------------+-------------------+           PSV cm/sEDV cm/sDescribe        Arm Pressure (mmHG) +----------+--------+--------+----------------+-------------------+ Subclavian214             Multiphasic, WNL                    +----------+--------+--------+----------------+-------------------+ +---------+--------+--+--------+--+---------+ VertebralPSV cm/s55EDV cm/s10Antegrade +---------+--------+--+--------+--+---------+   Summary: Right Carotid: Velocities in the right ICA are consistent with a 60-79%                 stenosis. Left Carotid: Velocities in the left ICA are consistent with a 60-79%  stenosis. Vertebrals:  Bilateral vertebral arteries demonstrate antegrade flow. Subclavians: Right subclavian artery was stenotic. Normal flow hemodynamics were              seen in the left subclavian artery. *See table(s) above for measurements and observations.  Electronically signed by Gaile New MD on 08/10/2023 at 12:12:31 PM.    Final    VAS US  ABI WITH/WO TBI Result Date: 08/10/2023  LOWER EXTREMITY DOPPLER STUDY Patient Name:  JIDENNA FIGGS  Date of Exam:   08/10/2023 Medical Rec #: [id number]       Accession #:    [id number] Date of Birth: Mar 23, 1946       Patient Gender: M Patient Age:   32 years Exam Location:  [facility] Procedure:      VAS US  ABI WITH/WO TBI Referring Phys: GAILE NEW --------------------------------------------------------------------------------  Indications: Peripheral artery disease. High Risk Factors: Hyperlipidemia, coronary artery disease. Other Factors: Hx CABG x4.  Performing Technologist: Duwaine Hives RVS  Examination Guidelines: A complete evaluation includes at minimum, Doppler waveform signals and systolic blood pressure reading at the level of bilateral brachial, anterior tibial, and posterior tibial arteries, when vessel segments are accessible. Bilateral testing is considered an integral part of a complete examination. Photoelectric Plethysmograph (PPG) waveforms and toe systolic pressure readings are included as required and additional duplex testing as needed. Limited examinations for reoccurring indications may be performed as noted.  ABI Findings: +---------+------------------+-----+---------+--------+ Right    Rt Pressure (mmHg)IndexWaveform Comment  +---------+------------------+-----+---------+--------+ Brachial 126                                      +---------+------------------+-----+---------+--------+ PTA      118                0.94 triphasic         +---------+------------------+-----+---------+--------+ DP       133               1.06 triphasic         +---------+------------------+-----+---------+--------+ Great Toe88                0.70                   +---------+------------------+-----+---------+--------+ +---------+------------------+-----+---------+-------+ Left     Lt Pressure (mmHg)IndexWaveform Comment +---------+------------------+-----+---------+-------+ Brachial 113                                     +---------+------------------+-----+---------+-------+ PTA      122               0.97 triphasic        +---------+------------------+-----+---------+-------+ DP       103               0.82 triphasic        +---------+------------------+-----+---------+-------+ Great Toe90                0.71                  +---------+------------------+-----+---------+-------+ +-------+-----------+-----------+------------+------------+ ABI/TBIToday's ABIToday's TBIPrevious ABIPrevious TBI +-------+-----------+-----------+------------+------------+ Right  1.06       0.70                                +-------+-----------+-----------+------------+------------+  Left   0.97       0.71                                +-------+-----------+-----------+------------+------------+  Summary: Right: Resting right ankle-brachial index is within normal range. The right toe-brachial index is normal. Left: Resting left ankle-brachial index is within normal range. The left toe-brachial index is normal. *See table(s) above for measurements and observations.  Electronically signed by Gaile New MD on 08/10/2023 at 12:10:23 PM.    Final      Consults: Case discussed with Polly.   Final Reassessment and Plan:   Discussion with general surgery.  He recommended antiemetic trial and p.o. tolerant trial.  If tolerating p.o. intake in the form of liquids, he would be stable to follow-up  with [facility] on Monday or Tuesday and recommended calling clinic to make that appointment. Patient is ambulatory tolerating p.o. intake after 7 hours of observation in the emergency room.  He is improved after Reglan  administration feels comfortable with outpatient care management.  Recommended high-protein liquid diet follow-up with PCP.  Disposition:  I have considered need for hospitalization, however, considering all of the above, I believe this patient is stable for discharge at this time.  Patient/family educated about specific return precautions for given chief complaint and symptoms.  Patient/family educated about follow-up with PCP/gen surg/Gastro.     Patient/family expressed understanding of return precautions and need for follow-up. Patient spoken to regarding all imaging and laboratory results and appropriate follow up for these results. All education provided in verbal form with additional information in written form. Time was allowed for answering of patient questions. Patient discharged.    Emergency Department Medication Summary:   Medications  metoCLOPramide  (REGLAN ) injection 10 mg (10 mg Intravenous Given 09/05/23 1716)  lactated ringers  bolus 1,000 mL (0 mLs Intravenous Stopped 09/05/23 1925)  potassium chloride  10 mEq in 100 mL IVPB (0 mEq Intravenous Stopped 09/05/23 1839)     Clinical Impression:  1. Nausea and vomiting, unspecified vomiting type      Discharge   Final Clinical Impression(s) / ED Diagnoses Final diagnoses:  Nausea and vomiting, unspecified vomiting type    Rx / DC Orders ED Discharge Orders          Ordered    metoCLOPramide  (REGLAN ) 10 MG tablet  Every 6 hours        09/05/23 2056              Jerral Meth, MD 09/05/23 2057

## 2023-09-05 NOTE — ED Triage Notes (Signed)
 Pt arrived POV from home c/o abdominal pain x3 months. Pt states it starts in the middle of his stomach and radiates up. Pt states he is not able to eat without getting sick and nauseous.

## 2023-09-16 ENCOUNTER — Telehealth: Payer: Self-pay | Admitting: Cardiology

## 2023-09-16 MED ORDER — NITROGLYCERIN 0.4 MG SL SUBL: 0.4000 mg | SUBLINGUAL_TABLET | SUBLINGUAL | 3 refills | Status: AC | PRN

## 2023-09-16 MED ORDER — METOPROLOL SUCCINATE ER 25 MG PO TB24: 12.5000 mg | ORAL_TABLET | Freq: Every day | ORAL | 3 refills | Status: DC

## 2023-09-16 NOTE — Telephone Encounter (Signed)
 Medications refilled per patient's request. Metoprolol  XL 25 mg and nitroglycerin 

## 2023-09-16 NOTE — Telephone Encounter (Signed)
*  STAT* If patient is at the pharmacy, call can be transferred to refill team.   1. Which medications need to be refilled? (please list name of each medication and dose if known)  metoprolol  succinate (TOPROL -XL) 25 MG 24 hr tablet   nitroGLYCERIN  (NITROSTAT ) 0.4 MG SL tablet   2. Which pharmacy/location (including street and city if local pharmacy) is medication to be sent to? [facility] [address] - [place], [place] - [address] Phone: [phone]  Fax: [phone]     3. Do they need a 30 day or 90 day supply? 90

## 2023-09-17 ENCOUNTER — Ambulatory Visit: Payer: Self-pay | Admitting: General Surgery

## 2023-09-17 NOTE — H&P (Signed)
History of Present Illness The patient, a 78 year old male with a known hiatal hernia, presents for follow-up after a recent ER visit due to pain. The patient describes episodes of radiating pain that ascends into the chest, accompanied by nausea and vomiting. The vomiting typically occurs after two to three hours of pain and consists of undigested food. The patient reports no appetite and has been trying to maintain nutrition through consumption of Gatorade and soft foods. The patient also mentions recent cataract surgery and a history of stroke.   The patient sees dr hochrein and young for CABG and neurologist   CT shows a 4.2cm hiatal hernia with a large portion of the stomach in the chest cavity.   Review of Systems     Problem List There is no problem list on file for this patient.      Medications Prior to Visit Outpatient Medications Prior to Visit Medication Sig Dispense Refill  clopidogreL (PLAVIX) 75 mg tablet Take 75 mg by mouth once daily      mesalamine (ASACOL HD) 800 mg EC tablet Take 800 mg by mouth      metoclopramide (REGLAN) 10 MG tablet Take 10 mg by mouth      nitroGLYcerin (NITROSTAT) 0.4 MG SL tablet Place 0.4 mg under the tongue every 5 (five) minutes as needed      omeprazole (PRILOSEC) 20 MG DR capsule Take 1 capsule by mouth once daily      traZODone (DESYREL) 50 MG tablet Take 50 mg by mouth at bedtime        No facility-administered medications prior to visit.            Objective  Vitals:   09/17/23 1413 PainSc: 0-No pain PainLoc: Abdomen   There is no height or weight on file to calculate BMI.   Home Vitals:      Physical Exam Physical Exam     PE:   Constitutional: No acute distress, conversant, appears states age. Eyes: Anicteric sclerae, moist conjunctiva, no lid lag Lungs: Clear to auscultation bilaterally, normal respiratory effort CV: regular rate and rhythm, no murmurs, no peripheral edema, pedal pulses 2+ GI: Soft, no  masses or hepatosplenomegaly, non-tender to palpation Skin: No rashes, palpation reveals normal turgor Psychiatric: appropriate judgment and insight, oriented to person, place, and time   Results RADIOLOGY Esophagram: Hiatal hernia with 50% of stomach in thoracic cavity          Assessment/Plan:   Assessment & Plan Hiatal Hernia   A recent ER visit was due to pain and vomiting, likely caused by the hernia leading to obstruction and reflux. Surgical intervention is necessary to prevent further episodes and complications. Plan for robotic hiatal hernia repair. Coordinate with a neurologist and cardiologist to temporarily discontinue Plavix before surgery. Post-operative diet will progress from a pureed diet for 2 weeks, to a soft diet for 2 weeks, then a chopped diet for 2 weeks, before returning to a normal diet with modifications such as smaller bites and slower eating. A lifting restriction of no more than 20 pounds is advised for 6 weeks post-surgery.   We discussed the risk and benefits of the procedure to include on room to: Infection, bleeding, damage structures, possible recurrence.  Patient voiced understanding wishes to proceed.   Nutrition   There is poor appetite and difficulty maintaining adequate nutrition due to hernia symptoms. Encourage high protein intake, considering protein shakes or protein powder. Continue with tolerable foods like Gatorade and soft foods.  Anticoagulation   Plavix is taken due to a previous stroke during cardiac surgery. Consult with a neurologist and cardiologist to ensure safe discontinuation of Plavix prior to hernia repair surgery.   Diagnoses and all orders for this visit:   Hiatal hernia                               Future Appointments  This patient does not currently have any appointments scheduled.         Patient Instructions You are being scheduled for surgery - Our schedulers will call you.     You should hear  from our office's scheduling department in 7-10 working days about the location, date, and time of surgery.  We try to make accommodations for patient's preferences in scheduling surgery, but sometimes the OR schedule or the surgeon's schedule prevents [place] from making those accommodations.   If you have not heard from our office [phone]) in 10 working days, call the office and ask for your surgeon's nurse.   If you have other questions about your diagnosis, plan, or surgery, call the office and ask for your surgeon's nurse.    An after visit summary was provided for the patient either in written format (printed) or through MyChart.   This note has been created using automated tools and reviewed for accuracy by [facility].     Contains text generated by [employer] Electronically signed by Axel Filler, MD on 09/17/2023  2:38 PM

## 2023-09-18 ENCOUNTER — Telehealth (HOSPITAL_BASED_OUTPATIENT_CLINIC_OR_DEPARTMENT_OTHER): Payer: Self-pay

## 2023-09-18 ENCOUNTER — Telehealth: Payer: Self-pay | Admitting: *Deleted

## 2023-09-18 ENCOUNTER — Encounter: Payer: Medicare PPO | Admitting: Gastroenterology

## 2023-09-18 NOTE — Telephone Encounter (Signed)
   Name: Martin [name]  DOB: [birth year]  [name]: [id number]  Primary Cardiologist: Rollene Rotunda, MD   Preoperative team, please contact this patient and set up a phone call appointment for further preoperative risk assessment. Please obtain consent and complete medication review. Thank you for your help.  I confirm that guidance regarding antiplatelet and oral anticoagulation therapy has been completed and, if necessary, noted below.  Guidance for holding Plavix should come from prescribing provider which is currently neurology.  Please contact for guidance on medication.  I also confirmed the patient resides in the state of [place]. As per [facility] Medical Board telemedicine laws, the patient must reside in the state in which the provider is licensed.   Napoleon Form, Leodis Rains, NP 09/18/2023, 9:38 AM [facility]

## 2023-09-18 NOTE — Telephone Encounter (Signed)
   Pre-operative Risk Assessment    Patient Name: Martin [name]  DOB: 05-Mar-1946 [name]: [id number]   Date of last office visit: 07/15/2023 Date of next office visit: None   Request for Surgical Clearance    Procedure:   Hiatal Hernia Surgery  Date of Surgery:  Clearance TBD                                 Surgeon:  Dr. Axel Filler Surgeon's Group or Practice Name:  [facility] Phone number:  [phone] Fax number:  [phone]   Type of Clearance Requested:   - Medical  - Pharmacy:  Hold Clopidogrel (Plavix) Not Indicated.   Type of Anesthesia:  General    Additional requests/questions:    Signed, Emmit Pomfret   09/18/2023, 9:19 AM

## 2023-09-18 NOTE — Telephone Encounter (Signed)
Called patient to set up an Tele appt on 09/30/23 for pre-op, meds has been rec and consent done.     Patient Consent for Virtual Visit        VIVIAN PUROHIT has provided verbal consent on 09/18/2023 for a virtual visit (video or telephone).   CONSENT FOR VIRTUAL VISIT FOR:  Archer Asa  By participating in this virtual visit I agree to the following:  I hereby voluntarily request, consent and authorize [facility] and its employed or contracted physicians, physician assistants, nurse practitioners or other licensed health care professionals (the Practitioner), to provide me with telemedicine health care services (the "Services") as deemed necessary by the treating Practitioner. I acknowledge and consent to receive the Services by the Practitioner via telemedicine. I understand that the telemedicine visit will involve communicating with the Practitioner through live audiovisual communication technology and the disclosure of certain medical information by electronic transmission. I acknowledge that I have been given the opportunity to request an in-person assessment or other available alternative prior to the telemedicine visit and am voluntarily participating in the telemedicine visit.  I understand that I have the right to withhold or withdraw my consent to the use of telemedicine in the course of my care at any time, without affecting my right to future care or treatment, and that the Practitioner or I may terminate the telemedicine visit at any time. I understand that I have the right to inspect all information obtained and/or recorded in the course of the telemedicine visit and may receive copies of available information for a reasonable fee.  I understand that some of the potential risks of receiving the Services via telemedicine include:  Delay or interruption in medical evaluation due to technological equipment failure or disruption; Information transmitted may not be sufficient  (e.g. poor resolution of images) to allow for appropriate medical decision making by the Practitioner; and/or  In rare instances, security protocols could fail, causing a breach of personal health information.  Furthermore, I acknowledge that it is my responsibility to provide information about my medical history, conditions and care that is complete and accurate to the best of my ability. I acknowledge that Practitioner's advice, recommendations, and/or decision may be based on factors not within their control, such as incomplete or inaccurate data provided by me or distortions of diagnostic images or specimens that may result from electronic transmissions. I understand that the practice of medicine is not an exact science and that Practitioner makes no warranties or guarantees regarding treatment outcomes. I acknowledge that a copy of this consent can be made available to me via my patient portal [facility] MyChart), or I can request a printed copy by calling the office of  [facility].    I understand that my insurance will be billed for this visit.   I have read or had this consent read to me. I understand the contents of this consent, which adequately explains the benefits and risks of the Services being provided via telemedicine.  I have been provided ample opportunity to ask questions regarding this consent and the Services and have had my questions answered to my satisfaction. I give my informed consent for the services to be provided through the use of telemedicine in my medical care

## 2023-09-18 NOTE — Telephone Encounter (Signed)
Called patient to set up an Tele appt on 09/30/23 for pre-op, meds has been rec and consent done

## 2023-09-21 ENCOUNTER — Other Ambulatory Visit: Payer: Self-pay

## 2023-09-21 MED ORDER — ROSUVASTATIN CALCIUM 40 MG PO TABS: 40.0000 mg | ORAL_TABLET | Freq: Every day | ORAL | 3 refills | Status: DC

## 2023-09-30 ENCOUNTER — Ambulatory Visit: Payer: Medicare PPO | Attending: Physician Assistant | Admitting: Physician Assistant

## 2023-09-30 DIAGNOSIS — Z01810 Encounter for preprocedural cardiovascular examination: Secondary | ICD-10-CM | POA: Diagnosis not present

## 2023-09-30 NOTE — Telephone Encounter (Signed)
Notes faxed to surgeon. This phone note will be removed from the preop pool. Tereso Newcomer, PA-C  09/30/2023 2:29 PM

## 2023-09-30 NOTE — Progress Notes (Signed)
Virtual Visit via Telephone Note   Because of Martin [name]'s co-morbid illnesses, he is at least at moderate risk for complications without adequate follow up.  This format is felt to be most appropriate for this patient at this time.  The patient did not have access to video technology/had technical difficulties with video requiring transitioning to audio format only (telephone).  All issues noted in this document were discussed and addressed.  No physical exam could be performed with this format.  Please refer to the patient's chart for his consent to telehealth for [facility].  Evaluation Performed:  Preoperative cardiovascular risk assessment _____________   Date:  09/30/2023   Patient ID:  Martin [name], DOB Sep 11, 1945, [name] [id number] Patient Location:  Home - [place]  Provider location:   Office  Primary Care Provider:  [name], MD Primary Cardiologist:  Rollene Rotunda, MD    Chief Complaint / Patient Profile   78 y.o. 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 Myra Gianotti)  He is pending hiatal hernia surgery with Dr. Derrell Lolling under general anesthesia and presents today for telephonic preoperative cardiovascular risk assessment.  It has been requested that he hold Plavix for 5 days prior to his procedure.  History of Present Illness    Martin [name] is a 78 y.o. male who presents via audio/video conferencing for a telehealth visit today.  Pt was last seen in cardiology clinic on 07/15/2023 by Dr. Antoine Poche.  At that time Martin [name] was doing well.  The patient is now pending procedure as outlined above. Since his last  visit, he was admitted to [facility] with abdominal pain and mildly elevated troponins.  He was seen by cardiology.  Symptoms were felt to be due to GI etiology (hiatal hernia).  Echocardiogram demonstrated low normal ejection fraction.  No further cardiac testing is warranted.  His son is with him on the phone today to help answer questions.  The patient has not had any chest discomfort, shortness of breath or syncope.  He remains somewhat active without limitation.    Physical Exam    Vital Signs:  Martin [name] does not have vital signs available for review today.  Given telephonic nature of communication, physical exam is limited. AAOx3. NAD. Normal affect.  Speech and respirations are unlabored.  Accessory Clinical Findings    None    Assessment & Plan    Assessment & Plan Preoperative cardiovascular examination Mr. Falzone's perioperative risk of a major cardiac event is 11% according to the Revised Cardiac Risk Index (RCRI).  Therefore, he is at high risk for perioperative complications.  However, his functional capacity is fair at 4.31 METs according to the Duke Activity Status Index (DASI). Recommendations: According to ACC/AHA guidelines, no further cardiovascular testing needed.  The patient may proceed to surgery at acceptable risk.   Antiplatelet and/or Anticoagulation Recommendations: Clopidogrel (Plavix) is managed by his neurologist. Recommendations for this drug will need to come from them. Ideally, from a cardiac standpoint, it would be beneficial for the patient to take aspirin while he is off of Clopidogrel. However, if the bleeding risk of his surgery is too great, this  should not be done.    A copy of this note will be routed to requesting surgeon.  Time:   Today, I have spent 7 minutes with the patient with telehealth technology discussing medical history, symptoms, and management plan.     Tereso Newcomer, PA-C 09/30/2023, 2:27 PM

## 2023-10-02 ENCOUNTER — Encounter: Payer: Self-pay | Admitting: Gastroenterology

## 2023-10-20 ENCOUNTER — Encounter: Payer: Medicare PPO | Admitting: Gastroenterology

## 2023-12-17 NOTE — Progress Notes (Signed)
 Surgical Instructions    Your procedure is scheduled on Tuesday, April 29th, 2025.   Report to [facility] Main Entrance "A" at 11:30 A.M., then check in with the Admitting office.  Call this number if you have problems the morning of surgery:  [phone]   If you have any questions prior to your surgery date call [phone]: Open Monday-Friday 8am-4pm If you experience any cold or flu symptoms such as cough, fever, chills, shortness of breath, etc. between now and your scheduled surgery, please notify us at the above number     Remember:  Do not eat after midnight the night before your surgery  You may drink clear liquids until 10:30 the morning of your surgery.   Clear liquids allowed are: Water, Non-Citrus Juices (without pulp), Carbonated Beverages, Clear Tea, Black Coffee ONLY (NO MILK, CREAM OR POWDERED CREAMER of any kind), and Gatorade  Patient Instructions  The night before surgery:  No food after midnight. ONLY clear liquids after midnight  The day of surgery (if you do NOT have diabetes):  Drink ONE (1) Pre-Surgery Clear Ensure by 10:30 the morning of surgery. Drink in one sitting. Do not sip.  This drink was given to you during your hospital  pre-op appointment visit.  Nothing else to drink after completing the  Pre-Surgery Clear Ensure.          If you have questions, please contact your surgeon's office.     Take these medicines the morning of surgery with A SIP OF WATER:   loratadine (CLARITIN)  metoprolol succinate (TOPROL-XL)  pantoprazole (PROTONIX)  tamsulosin (FLOMAX)   If needed:  nitroGLYCERIN (NITROSTAT)   Ask MD if you need to hold ASACOL HD prior to surgery  Ask MD if you need to hold Plavix prior to surgery.  As of today, STOP taking any Aspirin (unless otherwise instructed by your surgeon) Aleve, Naproxen, Ibuprofen, Motrin, Advil, Goody's, BC's, all herbal medications, fish oil, and all vitamins.    The day of surgery:          Do  not wear jewelry or makeup. Do not wear lotions, powders, cologne or deodorant. Men may shave face and neck. Do not bring valuables to the hospital.  [facility] is not responsible for any belongings or valuables.    Do NOT Smoke (Tobacco/Vaping)  24 hours prior to your procedure  If you use a CPAP at night, you may bring your mask for your overnight stay.   Contacts, glasses, hearing aids, dentures or partials may not be worn into surgery, please bring cases for these belongings   For patients admitted to the hospital, discharge time will be determined by your treatment team.   Patients discharged the day of surgery will not be allowed to drive home, and someone needs to stay with them for 24 hours.   SURGICAL WAITING ROOM VISITATION Patients having surgery or a procedure may have no more than 2 support people in the waiting area - these visitors may rotate.   Children under the age of 26 must have an adult with them who is not the patient. If the patient needs to stay at the hospital during part of their recovery, the visitor guidelines for inpatient rooms apply. Pre-op nurse will coordinate an appropriate time for 1 support person to accompany patient in pre-op.  This support person may not rotate.   Please refer to [email or web address] for the visitor guidelines for Inpatients (after your surgery is over and you are in a  regular room).    Special instructions:    Oral Hygiene is also important to reduce your risk of infection.  Remember - BRUSH YOUR TEETH THE MORNING OF SURGERY WITH YOUR REGULAR TOOTHPASTE   [facility]- Preparing For Surgery  Before surgery, you can play an important role. Because skin is not sterile, your skin needs to be as free of germs as possible. You can reduce the number of germs on your skin by washing with CHG (chlorahexidine gluconate) Soap before surgery.  CHG is an antiseptic cleaner which kills germs  and bonds with the skin to continue killing germs even after washing.     Please do not use if you have an allergy to CHG or antibacterial soaps. If your skin becomes reddened/irritated stop using the CHG.  Do not shave (including legs and underarms) for at least 48 hours prior to first CHG shower. It is OK to shave your face.  Please follow these instructions carefully.     Shower the NIGHT BEFORE SURGERY and the MORNING OF SURGERY with CHG Soap.   If you chose to wash your hair, wash your hair first as usual with your normal shampoo. After you shampoo, rinse your hair and body thoroughly to remove the shampoo.  Then [employer] and genitals (private parts) with your normal soap and rinse thoroughly to remove soap.  After that Use CHG Soap as you would any other liquid soap. You can apply CHG directly to the skin and wash gently with a scrungie or a clean washcloth.   Apply the CHG Soap to your body ONLY FROM THE NECK DOWN.  Do not use on open wounds or open sores. Avoid contact with your eyes, ears, mouth and genitals (private parts). Wash Face and genitals (private parts)  with your normal soap.   Wash thoroughly, paying special attention to the area where your surgery will be performed.  Thoroughly rinse your body with warm water from the neck down.  DO NOT shower/wash with your normal soap after using and rinsing off the CHG Soap.  Pat yourself dry with a CLEAN TOWEL.  Wear CLEAN PAJAMAS to bed the night before surgery  Place CLEAN SHEETS on your bed the night before your surgery  DO NOT SLEEP WITH PETS.   Day of Surgery:  Take a shower with CHG soap. Wear Clean/Comfortable clothing the morning of surgery Do not apply any deodorants/lotions.   Remember to brush your teeth WITH YOUR REGULAR TOOTHPASTE.    If you received a COVID test during your pre-op visit, it is requested that you wear a mask when out in public, stay away from anyone that may not be feeling well, and  notify your surgeon if you develop symptoms. If you have been in contact with anyone that has tested positive in the last 10 days, please notify your surgeon.    Please read over the following fact sheets that you were given.

## 2023-12-21 ENCOUNTER — Other Ambulatory Visit: Payer: Self-pay

## 2023-12-21 ENCOUNTER — Encounter (HOSPITAL_COMMUNITY)
Admission: RE | Admit: 2023-12-21 | Discharge: 2023-12-21 | Disposition: A | Discharge status: Home / Self Care | Source: Ambulatory Visit | Attending: General Surgery

## 2023-12-21 ENCOUNTER — Encounter (HOSPITAL_COMMUNITY): Payer: Self-pay

## 2023-12-21 VITALS — BP 145/58 | HR 55 | Temp 98.8°F | Resp 15 | Ht 73.0 in | Wt 217.3 lb

## 2023-12-21 DIAGNOSIS — Z7902 Long term (current) use of antithrombotics/antiplatelets: Secondary | ICD-10-CM | POA: Insufficient documentation

## 2023-12-21 DIAGNOSIS — Z8673 Personal history of transient ischemic attack (TIA), and cerebral infarction without residual deficits: Secondary | ICD-10-CM | POA: Diagnosis not present

## 2023-12-21 DIAGNOSIS — K219 Gastro-esophageal reflux disease without esophagitis: Secondary | ICD-10-CM | POA: Insufficient documentation

## 2023-12-21 DIAGNOSIS — Z01812 Encounter for preprocedural laboratory examination: Secondary | ICD-10-CM | POA: Diagnosis present

## 2023-12-21 DIAGNOSIS — Z8546 Personal history of malignant neoplasm of prostate: Secondary | ICD-10-CM | POA: Insufficient documentation

## 2023-12-21 DIAGNOSIS — I2510 Atherosclerotic heart disease of native coronary artery without angina pectoris: Secondary | ICD-10-CM | POA: Diagnosis not present

## 2023-12-21 DIAGNOSIS — K449 Diaphragmatic hernia without obstruction or gangrene: Secondary | ICD-10-CM | POA: Diagnosis not present

## 2023-12-21 DIAGNOSIS — I5020 Unspecified systolic (congestive) heart failure: Secondary | ICD-10-CM | POA: Diagnosis not present

## 2023-12-21 DIAGNOSIS — E7800 Pure hypercholesterolemia, unspecified: Secondary | ICD-10-CM | POA: Diagnosis not present

## 2023-12-21 DIAGNOSIS — Z951 Presence of aortocoronary bypass graft: Secondary | ICD-10-CM | POA: Insufficient documentation

## 2023-12-21 HISTORY — DX: Anemia, unspecified: D64.9

## 2023-12-21 HISTORY — DX: Peripheral vascular disease, unspecified: I73.9

## 2023-12-21 HISTORY — DX: Cerebral infarction, unspecified: I63.9

## 2023-12-21 HISTORY — DX: Personal history of other diseases of the digestive system: Z87.19

## 2023-12-21 LAB — BASIC METABOLIC PANEL WITH GFR
Anion gap: 9 (ref 5–15)
BUN: 12 mg/dL (ref 8–23)
CO2: 28 mmol/L (ref 22–32)
Calcium: 9.1 mg/dL (ref 8.9–10.3)
Chloride: 104 mmol/L (ref 98–111)
Creatinine, Ser: 1.28 mg/dL — ABNORMAL HIGH (ref 0.61–1.24)
GFR, Estimated: 58 mL/min — ABNORMAL LOW (ref >60)
Glucose, Bld: 103 mg/dL — ABNORMAL HIGH (ref 70–99)
Potassium: 3.4 mmol/L — ABNORMAL LOW (ref 3.5–5.1)
Sodium: 141 mmol/L (ref 135–145)

## 2023-12-21 LAB — CBC
HCT: 38.3 % — ABNORMAL LOW (ref 39.0–52.0)
Hemoglobin: 12 g/dL — ABNORMAL LOW (ref 13.0–17.0)
MCH: 29.7 pg (ref 26.0–34.0)
MCHC: 31.3 g/dL (ref 30.0–36.0)
MCV: 94.8 fL (ref 80.0–100.0)
Platelets: 247 10*3/uL (ref 150–400)
RBC: 4.04 MIL/uL — ABNORMAL LOW (ref 4.22–5.81)
RDW: 13.5 % (ref 11.5–15.5)
WBC: 6.9 10*3/uL (ref 4.0–10.5)
nRBC: 0 % (ref 0.0–0.2)

## 2023-12-21 NOTE — Pre-Procedure Instructions (Signed)
 Surgical Instructions   Your procedure is scheduled on December 29, 2023. Report to [facility] Main Entrance "A" at 11:30 A.M., then check in with the Admitting office. Any questions or running late day of surgery: call [phone]  Questions prior to your surgery date: call [phone], Monday-Friday, 8am-4pm. If you experience any cold or flu symptoms such as cough, fever, chills, shortness of breath, etc. between now and your scheduled surgery, please notify us  at the above number.     Remember:  Do not eat after midnight the night before your surgery   You may drink clear liquids until 10:30 AM the morning of your surgery.   Clear liquids allowed are: Water , Non-Citrus Juices (without pulp), Carbonated Beverages, Clear Tea (no milk, honey, etc.), Black Coffee Only (NO MILK, CREAM OR POWDERED CREAMER of any kind), and Gatorade.  Patient Instructions  The night before surgery:  No food after midnight. ONLY clear liquids after midnight  The day of surgery (if you do NOT have diabetes):  Drink ONE (1) Pre-Surgery Clear Ensure by 10:30 AM the morning of surgery. Drink in one sitting. Do not sip.  This drink was given to you during your hospital  pre-op appointment visit.  Nothing else to drink after completing the  Pre-Surgery Clear Ensure.         If you have questions, please contact your surgeon's office.    Take these medicines the morning of surgery with A SIP OF WATER : loratadine  (CLARITIN )  metoprolol  succinate (TOPROL -XL)  pantoprazole  (PROTONIX )  tamsulosin  (FLOMAX )    May take these medicines IF NEEDED: nitroGLYCERIN  (NITROSTAT ) - if dose taken prior to surgery, please call either of the above phone numbers   STOP taking your clopidogrel  (PLAVIX ) five days prior to surgery. Your last dose will be April 23rd.    One week prior to surgery, STOP taking any Aspirin  (unless otherwise instructed by your surgeon) Aleve, Naproxen, Ibuprofen, Motrin, Advil, Goody's,  BC's, all herbal medications, fish oil, and non-prescription vitamins.                     Do NOT Smoke (Tobacco/Vaping) for 24 hours prior to your procedure.  If you use a CPAP at night, you may bring your mask/headgear for your overnight stay.   You will be asked to remove any contacts, glasses, piercing's, hearing aid's, dentures/partials prior to surgery. Please bring cases for these items if needed.    Patients discharged the day of surgery will not be allowed to drive home, and someone needs to stay with them for 24 hours.  SURGICAL WAITING ROOM VISITATION Patients may have no more than 2 support people in the waiting area - these visitors may rotate.   Pre-op nurse will coordinate an appropriate time for 1 ADULT support person, who may not rotate, to accompany patient in pre-op.  Children under the age of 34 must have an adult with them who is not the patient and must remain in the main waiting area with an adult.  If the patient needs to stay at the hospital during part of their recovery, the visitor guidelines for inpatient rooms apply.  Please refer to the [facility] website for the visitor guidelines for any additional information.   If you received a COVID test during your pre-op visit  it is requested that you wear a mask when out in public, stay away from anyone that may not be feeling well and notify your surgeon if you develop symptoms. If  you have been in contact with anyone that has tested positive in the last 10 days please notify you surgeon.      Pre-operative CHG Bathing Instructions   You can play a key role in reducing the risk of infection after surgery. Your skin needs to be as free of germs as possible. You can reduce the number of germs on your skin by washing with CHG (chlorhexidine  gluconate) soap before surgery. CHG is an antiseptic soap that kills germs and continues to kill germs even after washing.   DO NOT use if you have an allergy to  chlorhexidine /CHG or antibacterial soaps. If your skin becomes reddened or irritated, stop using the CHG and notify one of our RNs at [phone].              TAKE A SHOWER THE NIGHT BEFORE SURGERY AND THE DAY OF SURGERY    Please keep in mind the following:  DO NOT shave, including legs and underarms, 48 hours prior to surgery.   You may shave your face before/day of surgery.  Place clean sheets on your bed the night before surgery Use a clean washcloth (not used since being washed) for each shower. DO NOT sleep with pet's night before surgery.  CHG Shower Instructions:  Wash your face and private area with normal soap. If you choose to wash your hair, wash first with your normal shampoo.  After you use shampoo/soap, rinse your hair and body thoroughly to remove shampoo/soap residue.  Turn the water  OFF and apply half the bottle of CHG soap to a CLEAN washcloth.  Apply CHG soap ONLY FROM YOUR NECK DOWN TO YOUR TOES (washing for 3-5 minutes)  DO NOT use CHG soap on face, private areas, open wounds, or sores.  Pay special attention to the area where your surgery is being performed.  If you are having back surgery, having someone wash your back for you may be helpful. Wait 2 minutes after CHG soap is applied, then you may rinse off the CHG soap.  Pat dry with a clean towel  Put on clean pajamas    Additional instructions for the day of surgery: DO NOT APPLY any lotions, deodorants, cologne, or perfumes.   Do not wear jewelry or makeup Do not wear nail polish, gel polish, artificial nails, or any other type of covering on natural nails (fingers and toes) Do not bring valuables to the hospital. [facility] is not responsible for valuables/personal belongings. Put on clean/comfortable clothes.  Please brush your teeth.  Ask your nurse before applying any prescription medications to the skin.

## 2023-12-21 NOTE — Progress Notes (Signed)
 PCP - Dr. Magdaleno Schooling Cardiologist - Dr. Eilleen Grates - last office visit 07/15/2023 Neurologist - Dr. Arlington Bennetts - Last office visit 01/29/2023  PPM/ICD - Denies Device Orders - n/a Rep Notified - n/a  Chest x-ray - 07/23/2023 EKG - 07/27/2023 Stress Test - 11/08/2020 ECHO - 07/27/2023 Cardiac Cath - 12/05/2020  Sleep Study - Denies CPAP - n/a  No DM  Last dose of GLP1 agonist- n/a GLP1 instructions: n/a  Blood Thinner Instructions: Pt instructed to hold Plavix  5 days prior to surgery. Last dose will be April 23rd. Aspirin  Instructions: n/a  ERAS Protcol - Clear liquids until 1030 morning of surgery PRE-SURGERY Ensure or G2- Ensure given to pt with instructions  COVID TEST- n/a   Anesthesia review: Yes. Cardiac clearance (MI with stents, CAD s/p CABG in 2022), carotid artery stenosis (followed by Dr. Charlotte Cookey) and TIA/stroke (unsure time frame, but MDs believed it occurred during OHS.  Patient denies shortness of breath, fever, cough and chest pain at PAT appointment. Pt denies any respiratory illness/infection in the last two months.   All instructions explained to the patient, with a verbal understanding of the material. Patient agrees to go over the instructions while at home for a better understanding. Patient also instructed to self quarantine after being tested for COVID-19. The opportunity to ask questions was provided.

## 2023-12-22 NOTE — Anesthesia Preprocedure Evaluation (Addendum)
 Anesthesia Evaluation  Patient identified by [name], date of birth, ID band Patient awake    Reviewed: Allergy & Precautions, NPO status , Patient's Chart, lab work & pertinent test results  Airway Mallampati: II  TM Distance: <3 FB Neck ROM: Full    Dental  (+) Teeth Intact, Dental Advisory Given   Pulmonary neg pulmonary ROS   Pulmonary exam normal breath sounds clear to auscultation       Cardiovascular hypertension, (-) angina + CAD, + Past MI, + Cardiac Stents, + CABG, + Peripheral Vascular Disease and +CHF  Normal cardiovascular exam Rhythm:Regular Rate:Normal     Neuro/Psych  PSYCHIATRIC DISORDERS Anxiety Depression    CVA, Residual Symptoms    GI/Hepatic Neg liver ROS, hiatal hernia, PUD,GERD  Medicated and Controlled,,UC   Endo/Other  negative endocrine ROS    Renal/GU negative Renal ROS     Musculoskeletal negative musculoskeletal ROS (+)    Abdominal   Peds  Hematology  (+) Blood dyscrasia (Plavix ), anemia   Anesthesia Other Findings Day of surgery medications reviewed with the patient.  Reproductive/Obstetrics                             Anesthesia Physical Anesthesia Plan  ASA: 3  Anesthesia Plan: General   Post-op Pain Management:    Induction: Intravenous  PONV Risk Score and Plan: 2 and Dexamethasone  and Ondansetron   Airway Management Planned: Oral ETT  Additional Equipment:   Intra-op Plan:   Post-operative Plan: Extubation in OR  Informed Consent: I have reviewed the patients History and Physical, chart, labs and discussed the procedure including the risks, benefits and alternatives for the proposed anesthesia with the patient or authorized representative who has indicated his/her understanding and acceptance.     Dental advisory given  Plan Discussed with: CRNA  Anesthesia Plan Comments: (PAT note written 12/22/2023 by Allison Zelenak, PA-C.  )        Anesthesia Quick Evaluation

## 2023-12-22 NOTE — Progress Notes (Signed)
 Anesthesia Chart Review:  Case: [id number] Date/Time: 12/29/23 1315   Procedure: REPAIR, HERNIA, HIATAL, ROBOT-ASSISTED - ROBOTIC HIATAL HERNIA REPAIR WITH FUNDOPLICATION AND MESH   Anesthesia type: General   Diagnosis: Hiatal hernia [K44.9]   Pre-op diagnosis: HIATAL HERNIA   [facility] 10 / [facility]   Surgeons: Shela Derby, MD       DISCUSSION: Patient is a 78 year old [name] scheduled for the above procedure.   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  He had a preoperative cardiology evaluation on 09/29/2018 followed by Marlyse Single, PA-C. S/p CABG in 10/2020. He had abdominal/atypical chest pain with slightly elevated troponinins with flat trends in 07/2023. Work-up revealing a large hiatal hernia with the stomach intrathoracic in location on the left. Since then, he was doing well from a cardiac standpoint. Hiatal hernia repair planned. He wrote,  "Mr. Halliwell's perioperative risk of a major cardiac event is 11% according to the Revised Cardiac Risk Index (RCRI).  Therefore, he is at high risk for perioperative complications.  However, his functional capacity is fair at 4.31 METs according to the Duke Activity Status Index (DASI). Recommendations: According to ACC/AHA guidelines, no further cardiovascular testing needed.  The patient may proceed to surgery at acceptable risk.   Antiplatelet and/or Anticoagulation Recommendations: Clopidogrel  (Plavix ) is managed by his neurologist. Recommendations for this drug will need to come from them. Ideally, from a cardiac standpoint, it would be beneficial for the patient to take aspirin  while  he is off of Clopidogrel . However, if the bleeding risk of his surgery is too great, this should not be done.    He is on Plavix  for history of CVA (chronic infarcts noted on 05/2022 MRI, thought to have likely occurred post CABG) and is followed by neurology. Vascular surgery is following bilateral carotid artery stenosis, Martin-79% in 08/2023. His neurologist had recommended cardiology evaluation prior to hernia surgery, which was done.  He reported instructions to hold Plavix  for surgery after 12/23/2023 dose.    Anesthesia team to evaluate on the day of surgery.   VS: BP (!) 145/58   Pulse (!) 55   Temp 37.1 C (Oral)   Resp 15   Ht 6\' 1"  (1.854 m)   Wt 98.6 kg   SpO2 96%   BMI 28.67 kg/m   PROVIDERS: Magdaleno Schooling, MD is PCP  Eilleen Grates, MD is cardiologist Caye Cocks, MD is vascular surgeon. Six month follow-up per 08/09/24 visit. Lajuan Pila, MD is GI [place] Bennetts, DO is neurologist Glorious Larry)   LABS: Labs reviewed: Acceptable for surgery. (all labs ordered are listed, but only abnormal results are displayed)  Labs Reviewed  BASIC METABOLIC PANEL WITH GFR - Abnormal; Notable for the following components:      Result Value   Potassium 3.4 (*)    Glucose, Bld 103 (*)    Creatinine, Ser 1.28 (*)    GFR, Estimated 58 (*)    All other components within normal limits  CBC - Abnormal; Notable for the following components:   RBC 4.04 (*)    Hemoglobin 12.0 (*)    HCT 38.3 (*)    All other components within normal limits     IMAGES: CTA Chest/abd/pelvis 07/24/23: IMPRESSION:  1. Atherosclerotic calcification with no evidence of aneurysm or dissection. 2. Large hiatal hernia with the stomach intrathoracic in location on the left. 3. Cholelithiasis. 4. Emphysema.   MRI Brain 05/12/22 (Canopy/PACS): IMPRESSION: No recent infarction, hemorrhage, or mass. Chronic infarcts detailed above.    EKG: 07/23/23: Normal sinus rhythm Rightward axis T wave  abnormality, consider inferior ischemia Abnormal ECG Confirmed by Kelsey Patricia [phone]) on 07/23/2023 11:10:10 PM   CV: US  Carotid 129/24: Summary:  Right Carotid: Velocities in the right ICA are consistent with a Martin-79% stenosis.  Left Carotid: Velocities in the left ICA are consistent with a Martin-79%  stenosis.  Vertebrals: Bilateral vertebral arteries demonstrate antegrade flow.  Subclavians: Right subclavian artery was stenotic. Normal flow hemodynamics were seen in the left subclavian artery.    Echo 07/25/23: IMPRESSIONS   1. Left ventricular ejection fraction, by estimation, is 50 to 55%. The  left ventricle has low normal function. The left ventricle demonstrates  regional wall motion abnormalities (The apical lateral segment and apical anterior segment are akinetic). Left ventricular diastolic   parameters are consistent with Grade I diastolic dysfunction (impaired  relaxation).   2. Right ventricular systolic function is normal. The right ventricular  size is normal.   3. The mitral valve is normal in structure. No evidence of mitral valve  regurgitation. No evidence of mitral stenosis.   4. The aortic valve is grossly normal. Aortic valve regurgitation is  trivial. No aortic stenosis is present.   5. The inferior vena cava is normal in size with greater than 50%  respiratory variability, suggesting right atrial pressure of 3 mmHg.  - Comparison 03/26/21 LVEF Martin-65%, no RWMA, grade 1 DD, mildly reduced RVSF; 12/05/20 LVEF 45-50%, severe hypokinesis of mid-apical anterior wall and anteroseptal wall, severe akinesis of the of the entire apical segment   RLE Venous US  06/04/23 (Canopy/PACS): IMPRESSION: No evidence of right lower extremity deep venous thrombosis.   Cardiac event monitor 07/17/22 - 12/15/223: Normal sinus rhythm One 3 beat run of non sustained ventricular tachycardia Rare isolated premature ventricular contractions No sustained arrhythmias No symptoms  reported.        RHC 12/05/20: Findings: Ao = 121/47 (55) LV = 125/10 RA = 1 RV =15/1 PA = 14/2 (8) PCW = 7 Fick cardiac output/index = 5.7/2.6 PVR = 0.2 WU Ao sat = 99% PA sat = 61%   Simultaneous RV/LV pressures show concordance with deep breathing   Assessment: 1. Low filling pressures with normal cardiac output and no evidence of tamponade.   Plan/Discussion: Continue medical therapy.   Last LHC was on 11/20/20 pre-CABG.  Past Medical History:  Diagnosis Date   Anemia    Iron Deficiency   Anxiety and depression    Benign prostatic hypertrophy    Cancer (HCC) 2020   Prostate Cancer   Coronary artery disease    cath 04/2007 with 90-95% RCA stenosis. Had DES placed.  Had an acute stent thrombosis in the hospital that day and subsequently had 3 DES placed in the RCA. The LAD had 45% stenosis, in the circ had 35% stenosis. The EF was well preserved.   Dyslipidemia    GERD (gastroesophageal reflux disease)    History of hiatal hernia    Hypercholesterolemia    Insomnia    Myocardial infarction [facility]) 2008   Peripheral vascular disease (HCC)    Carotid Stenosis - Followed by Dr. Charlotte Cookey   Stroke [facility])    TIA   Ulcerative colitis  Past Surgical History:  Procedure Laterality Date   BIOPSY  07/25/2023   Procedure: BIOPSY;  Surgeon: Annis Kinder, DO;  [facility];  Service: Gastroenterology;;  Barretts and H.Pylori r/o   CATARACT EXTRACTION Bilateral 2025   COLONOSCOPY  10/06/2016   Colonic polyps status post polypectomy. Previously diagnosed left sided ulcerative colitis now wiith only smudged vascular pattern (inactive endoscopically)   CORONARY ARTERY BYPASS GRAFT N/A 11/21/2020   Procedure: CORONARY ARTERY BYPASS GRAFTING (CABG) TIMES FOUR ON PUMP USING BILATERAL INTERNAL MAMMARY ARTERIES AND LEFT RADIAL ARTERY;  Surgeon: Rudine Cos, MD;  Location: [facility];  Service: Open Heart Surgery;  Laterality: N/A;  BIMA   ESOPHAGOGASTRODUODENOSCOPY  (EGD) WITH PROPOFOL  N/A 07/25/2023   Procedure: ESOPHAGOGASTRODUODENOSCOPY (EGD) WITH PROPOFOL ;  Surgeon: Annis Kinder, DO;  [facility];  Service: Gastroenterology;  Laterality: N/A;   INGUINAL HERNIA REPAIR Left 1999   INSERTION PROSTATE RADIATION SEED  2020   LEFT HEART CATH AND CORONARY ANGIOGRAPHY N/A 11/20/2020   Procedure: LEFT HEART CATH AND CORONARY ANGIOGRAPHY;  Surgeon: Millicent Ally, MD;  [facility] INV[facility] CV [facility];  Service: Cardiovascular;  Laterality: N/A;   RADIAL ARTERY HARVEST Left 11/21/2020   Procedure: RADIAL ARTERY HARVEST;  Surgeon: Rudine Cos, MD;  Location: [facility];  Service: Open Heart Surgery;  Laterality: Left;   RIGHT HEART CATH N/A 12/05/2020   Procedure: RIGHT HEART CATH;  Surgeon: Mardell Shade, MD;  [facility];  Service: Cardiovascular;  Laterality: N/A;   ROTATOR CUFF REPAIR Left    TEE WITHOUT CARDIOVERSION N/A 11/21/2020   Procedure: TRANSESOPHAGEAL ECHOCARDIOGRAM (TEE);  Surgeon: Rudine Cos, MD;  Location: [facility] OR;  Service: Open Heart Surgery;  Laterality: N/A;    MEDICATIONS:  ASACOL  HD 800 MG TBEC   clopidogrel  (PLAVIX ) 75 MG tablet   loratadine  (CLARITIN ) 10 MG tablet   metoCLOPramide  (REGLAN ) 10 MG tablet   metoprolol  succinate (TOPROL -XL) 25 MG 24 hr tablet   nitroGLYCERIN  (NITROSTAT ) 0.4 MG SL tablet   ondansetron  (ZOFRAN -ODT) 4 MG disintegrating tablet   pantoprazole  (PROTONIX ) 40 MG tablet   rosuvastatin  (CRESTOR ) 40 MG tablet   tamsulosin  (FLOMAX ) 0.4 MG CAPS capsule   traZODone  (DESYREL ) 50 MG tablet   No current facility-administered medications for this encounter.   Reglan  and Zofran  are listed as currently not taking.  Ella Gun, PA-C Surgical Short Stay/Anesthesiology [facility] Phone [phone] [facility] Phone [phone] 12/22/2023 3:55 PM

## 2023-12-29 ENCOUNTER — Encounter (HOSPITAL_COMMUNITY)
Admission: RE | Disposition: A | Discharge status: Home / Self Care | Payer: Self-pay | Source: Home / Self Care | Attending: General Surgery

## 2023-12-29 ENCOUNTER — Ambulatory Visit (HOSPITAL_COMMUNITY): Admitting: Vascular Surgery

## 2023-12-29 ENCOUNTER — Ambulatory Visit (HOSPITAL_BASED_OUTPATIENT_CLINIC_OR_DEPARTMENT_OTHER): Admitting: Certified Registered Nurse Anesthetist

## 2023-12-29 ENCOUNTER — Other Ambulatory Visit: Payer: Self-pay

## 2023-12-29 ENCOUNTER — Encounter (HOSPITAL_COMMUNITY): Payer: Self-pay | Admitting: General Surgery

## 2023-12-29 ENCOUNTER — Observation Stay (HOSPITAL_COMMUNITY)
Admission: RE | Admit: 2023-12-29 | Discharge: 2023-12-30 | Disposition: A | Discharge status: Home / Self Care | Attending: General Surgery | Admitting: General Surgery

## 2023-12-29 DIAGNOSIS — K449 Diaphragmatic hernia without obstruction or gangrene: Secondary | ICD-10-CM | POA: Diagnosis present

## 2023-12-29 DIAGNOSIS — Z7902 Long term (current) use of antithrombotics/antiplatelets: Secondary | ICD-10-CM | POA: Insufficient documentation

## 2023-12-29 DIAGNOSIS — I110 Hypertensive heart disease with heart failure: Secondary | ICD-10-CM

## 2023-12-29 DIAGNOSIS — I509 Heart failure, unspecified: Secondary | ICD-10-CM | POA: Diagnosis not present

## 2023-12-29 DIAGNOSIS — Z951 Presence of aortocoronary bypass graft: Secondary | ICD-10-CM | POA: Insufficient documentation

## 2023-12-29 DIAGNOSIS — Z9889 Other specified postprocedural states: Principal | ICD-10-CM | POA: Diagnosis present

## 2023-12-29 DIAGNOSIS — I2510 Atherosclerotic heart disease of native coronary artery without angina pectoris: Secondary | ICD-10-CM | POA: Diagnosis not present

## 2023-12-29 LAB — TYPE AND SCREEN
ABO/RH(D): A NEG
Antibody Screen: NEGATIVE

## 2023-12-29 SURGERY — REPAIR, HERNIA, HIATAL, ROBOT-ASSISTED
Anesthesia: General

## 2023-12-29 MED ORDER — ACETAMINOPHEN 10 MG/ML IV SOLN
INTRAVENOUS | Status: AC
  Filled 2023-12-29: qty 100

## 2023-12-29 MED ORDER — BUPIVACAINE LIPOSOME 1.3 % IJ SUSP
INTRAMUSCULAR | Status: AC
  Filled 2023-12-29: qty 20

## 2023-12-29 MED ORDER — DEXAMETHASONE SODIUM PHOSPHATE 10 MG/ML IJ SOLN
INTRAMUSCULAR | Status: DC | PRN
  Administered 2023-12-29: 10 mg via INTRAVENOUS

## 2023-12-29 MED ORDER — CEFAZOLIN SODIUM-DEXTROSE 2-4 GM/100ML-% IV SOLN
INTRAVENOUS | Status: AC
  Filled 2023-12-29: qty 100

## 2023-12-29 MED ORDER — ONDANSETRON HCL 4 MG/2ML IJ SOLN: 4.0000 mg | Freq: Four times a day (QID) | INTRAMUSCULAR | Status: DC | PRN

## 2023-12-29 MED ORDER — FENTANYL CITRATE (PF) 100 MCG/2ML IJ SOLN
INTRAMUSCULAR | Status: AC
  Filled 2023-12-29: qty 2

## 2023-12-29 MED ORDER — LIDOCAINE 2% (20 MG/ML) 5 ML SYRINGE
INTRAMUSCULAR | Status: DC | PRN
  Administered 2023-12-29: 100 mg via INTRAVENOUS

## 2023-12-29 MED ORDER — BUPIVACAINE-EPINEPHRINE 0.25% -1:200000 IJ SOLN
INTRAMUSCULAR | Status: DC | PRN
  Administered 2023-12-29: 12 mL

## 2023-12-29 MED ORDER — CHLORHEXIDINE GLUCONATE 0.12 % MT SOLN
15.0000 mL | Freq: Once | OROMUCOSAL | Status: AC
  Administered 2023-12-29: 15 mL via OROMUCOSAL
  Filled 2023-12-29: qty 15

## 2023-12-29 MED ORDER — SODIUM CHLORIDE 0.9 % IV SOLN: INTRAVENOUS | Status: DC | PRN

## 2023-12-29 MED ORDER — LIDOCAINE 2% (20 MG/ML) 5 ML SYRINGE
INTRAMUSCULAR | Status: AC
  Filled 2023-12-29: qty 5

## 2023-12-29 MED ORDER — ROCURONIUM BROMIDE 10 MG/ML (PF) SYRINGE
PREFILLED_SYRINGE | INTRAVENOUS | Status: AC
  Filled 2023-12-29: qty 10

## 2023-12-29 MED ORDER — NITROGLYCERIN 0.4 MG SL SUBL: 0.4000 mg | SUBLINGUAL_TABLET | SUBLINGUAL | Status: DC | PRN

## 2023-12-29 MED ORDER — DEXAMETHASONE SODIUM PHOSPHATE 10 MG/ML IJ SOLN
INTRAMUSCULAR | Status: AC
  Filled 2023-12-29: qty 1

## 2023-12-29 MED ORDER — TRAZODONE HCL 50 MG PO TABS
50.0000 mg | ORAL_TABLET | Freq: Every day | ORAL | Status: DC
Start: 2023-12-29 — End: 2023-12-30
  Administered 2023-12-29: 50 mg via ORAL
  Filled 2023-12-29: qty 1

## 2023-12-29 MED ORDER — 0.9 % SODIUM CHLORIDE (POUR BTL) OPTIME
TOPICAL | Status: DC | PRN
  Administered 2023-12-29: 1000 mL

## 2023-12-29 MED ORDER — CEFAZOLIN SODIUM-DEXTROSE 2-4 GM/100ML-% IV SOLN
2.0000 g | Freq: Once | INTRAVENOUS | Status: AC
  Administered 2023-12-29: 2 g via INTRAVENOUS

## 2023-12-29 MED ORDER — BUPIVACAINE LIPOSOME 1.3 % IJ SUSP
INTRAMUSCULAR | Status: DC | PRN
  Administered 2023-12-29: 40 mL

## 2023-12-29 MED ORDER — ROCURONIUM BROMIDE 10 MG/ML (PF) SYRINGE
PREFILLED_SYRINGE | INTRAVENOUS | Status: DC | PRN
  Administered 2023-12-29: 20 mg via INTRAVENOUS
  Administered 2023-12-29: 60 mg via INTRAVENOUS
  Administered 2023-12-29: 30 mg via INTRAVENOUS

## 2023-12-29 MED ORDER — ONDANSETRON HCL 4 MG/2ML IJ SOLN
INTRAMUSCULAR | Status: AC
  Filled 2023-12-29: qty 2

## 2023-12-29 MED ORDER — BUPIVACAINE-EPINEPHRINE (PF) 0.25% -1:200000 IJ SOLN
INTRAMUSCULAR | Status: AC
  Filled 2023-12-29: qty 30

## 2023-12-29 MED ORDER — DEXTROSE-SODIUM CHLORIDE 5-0.9 % IV SOLN: INTRAVENOUS | Status: DC

## 2023-12-29 MED ORDER — ACETAMINOPHEN 10 MG/ML IV SOLN
INTRAVENOUS | Status: DC | PRN
  Administered 2023-12-29: 1000 mg via INTRAVENOUS

## 2023-12-29 MED ORDER — ORAL CARE MOUTH RINSE: 15.0000 mL | Freq: Once | OROMUCOSAL | Status: AC

## 2023-12-29 MED ORDER — METOCLOPRAMIDE HCL 5 MG PO TABS
10.0000 mg | ORAL_TABLET | Freq: Four times a day (QID) | ORAL | Status: DC
  Administered 2023-12-29 – 2023-12-30 (×3): 10 mg via ORAL
  Filled 2023-12-29 (×3): qty 2

## 2023-12-29 MED ORDER — AMISULPRIDE (ANTIEMETIC) 5 MG/2ML IV SOLN
10.0000 mg | Freq: Once | INTRAVENOUS | Status: AC
  Administered 2023-12-29: 10 mg via INTRAVENOUS

## 2023-12-29 MED ORDER — ONDANSETRON HCL 4 MG/2ML IJ SOLN
INTRAMUSCULAR | Status: DC | PRN
  Administered 2023-12-29: 4 mg via INTRAVENOUS

## 2023-12-29 MED ORDER — STERILE WATER FOR INJECTION IJ SOLN
INTRAMUSCULAR | Status: DC | PRN
  Administered 2023-12-29: 20 mL

## 2023-12-29 MED ORDER — LACTATED RINGERS IV SOLN: INTRAVENOUS | Status: DC

## 2023-12-29 MED ORDER — PROPOFOL 10 MG/ML IV BOLUS
INTRAVENOUS | Status: AC
  Filled 2023-12-29: qty 20

## 2023-12-29 MED ORDER — ONDANSETRON HCL 4 MG/2ML IJ SOLN: 4.0000 mg | Freq: Once | INTRAMUSCULAR | Status: DC | PRN

## 2023-12-29 MED ORDER — ONDANSETRON 4 MG PO TBDP: 4.0000 mg | ORAL_TABLET | Freq: Four times a day (QID) | ORAL | Status: DC | PRN

## 2023-12-29 MED ORDER — TAMSULOSIN HCL 0.4 MG PO CAPS
0.4000 mg | ORAL_CAPSULE | Freq: Every day | ORAL | Status: DC
  Administered 2023-12-30: 0.4 mg via ORAL
  Filled 2023-12-29: qty 1

## 2023-12-29 MED ORDER — HYDROMORPHONE HCL 1 MG/ML IJ SOLN
1.0000 mg | INTRAMUSCULAR | Status: DC | PRN
  Filled 2023-12-29: qty 1

## 2023-12-29 MED ORDER — FENTANYL CITRATE (PF) 250 MCG/5ML IJ SOLN
INTRAMUSCULAR | Status: AC
  Filled 2023-12-29: qty 5

## 2023-12-29 MED ORDER — AMISULPRIDE (ANTIEMETIC) 5 MG/2ML IV SOLN
INTRAVENOUS | Status: AC
  Filled 2023-12-29: qty 4

## 2023-12-29 MED ORDER — PROPOFOL 10 MG/ML IV BOLUS
INTRAVENOUS | Status: DC | PRN
  Administered 2023-12-29: 100 mg via INTRAVENOUS

## 2023-12-29 MED ORDER — FENTANYL CITRATE (PF) 250 MCG/5ML IJ SOLN
INTRAMUSCULAR | Status: DC | PRN
  Administered 2023-12-29 (×2): 25 ug via INTRAVENOUS
  Administered 2023-12-29: 100 ug via INTRAVENOUS
  Administered 2023-12-29: 25 ug via INTRAVENOUS

## 2023-12-29 MED ORDER — FENTANYL CITRATE (PF) 100 MCG/2ML IJ SOLN
25.0000 ug | INTRAMUSCULAR | Status: DC | PRN
  Administered 2023-12-29 (×3): 50 ug via INTRAVENOUS

## 2023-12-29 MED ORDER — SUGAMMADEX SODIUM 200 MG/2ML IV SOLN
INTRAVENOUS | Status: DC | PRN
  Administered 2023-12-29: 195 mg via INTRAVENOUS

## 2023-12-29 MED ORDER — METOPROLOL SUCCINATE ER 25 MG PO TB24
12.5000 mg | ORAL_TABLET | Freq: Every day | ORAL | Status: DC
  Administered 2023-12-30: 12.5 mg via ORAL
  Filled 2023-12-29: qty 1

## 2023-12-29 SURGICAL SUPPLY — 51 items
CANNULA REDUCER 12-8 DVNC XI (CANNULA) ×1 IMPLANT
CHLORAPREP W/TINT 26 (MISCELLANEOUS) ×1 IMPLANT
CLIP APPLIE 5 13 M/L LIGAMAX5 (MISCELLANEOUS) IMPLANT
COVER MAYO STAND STRL (DRAPES) ×1 IMPLANT
COVER SURGICAL LIGHT HANDLE (MISCELLANEOUS) ×1 IMPLANT
COVER TIP SHEARS 8 DVNC (MISCELLANEOUS) IMPLANT
DEFOGGER SCOPE WARMER CLEARIFY (MISCELLANEOUS) ×1 IMPLANT
DERMABOND ADVANCED .7 DNX12 (GAUZE/BANDAGES/DRESSINGS) ×1 IMPLANT
DEVICE TROCAR PUNCTURE CLOSURE (ENDOMECHANICALS) ×1 IMPLANT
DRAIN PENROSE 0.5X18 (DRAIN) IMPLANT
DRAPE ARM DVNC X/XI (DISPOSABLE) ×4 IMPLANT
DRAPE CARDIOVASC SPLIT 88X140 (DRAPES) ×1 IMPLANT
DRAPE COLUMN DVNC XI (DISPOSABLE) ×1 IMPLANT
DRAPE SURG ORHT 6 SPLT 77X108 (DRAPES) ×1 IMPLANT
DRIVER NDL MEGA SUTCUT DVNCXI (INSTRUMENTS) ×1 IMPLANT
DRIVER NDLE MEGA SUTCUT DVNCXI (INSTRUMENTS) ×2 IMPLANT
ELECTRODE REM PT RTRN 9FT ADLT (ELECTROSURGICAL) ×1 IMPLANT
FORCEPS BPLR FENES DVNC XI (FORCEP) ×1 IMPLANT
GLOVE BIO SURGEON STRL SZ7.5 (GLOVE) ×5 IMPLANT
GOWN STRL REUS W/ TWL LRG LVL3 (GOWN DISPOSABLE) ×1 IMPLANT
GOWN STRL REUS W/ TWL XL LVL3 (GOWN DISPOSABLE) ×2 IMPLANT
GOWN STRL REUS W/TWL 2XL LVL3 (GOWN DISPOSABLE) ×1 IMPLANT
IRRIGATION SUCT STRKRFLW 2 WTP (MISCELLANEOUS) ×1 IMPLANT
KIT BASIN OR (CUSTOM PROCEDURE TRAY) ×1 IMPLANT
KIT TURNOVER KIT B (KITS) IMPLANT
MARKER SKIN DUAL TIP RULER LAB (MISCELLANEOUS) ×1 IMPLANT
MESH BIO-A 7X10 SYN MAT (Mesh General) ×1 IMPLANT
NDL 22X1.5 STRL (OR ONLY) (MISCELLANEOUS) ×1 IMPLANT
NDL INSUFFLATION 14GA 120MM (NEEDLE) ×1 IMPLANT
NEEDLE 22X1.5 STRL (OR ONLY) (MISCELLANEOUS) ×1 IMPLANT
NEEDLE INSUFFLATION 14GA 120MM (NEEDLE) ×1 IMPLANT
OBTURATOR OPTICALSTD 8 DVNC (TROCAR) IMPLANT
PENCIL SMOKE EVACUATOR (MISCELLANEOUS) IMPLANT
RETRACTOR GRSP SML 8 DVNC XI (INSTRUMENTS) ×1 IMPLANT
SCISSORS LAP 5X35 DISP (ENDOMECHANICALS) IMPLANT
SCISSORS MNPLR CVD DVNC XI (INSTRUMENTS) IMPLANT
SEAL UNIV 5-12 XI (MISCELLANEOUS) ×4 IMPLANT
SEALER VESSEL EXT DVNC XI (MISCELLANEOUS) ×1 IMPLANT
SET TUBE SMOKE EVAC HIGH FLOW (TUBING) ×1 IMPLANT
SPIKE FLUID TRANSFER (MISCELLANEOUS) ×1 IMPLANT
STAPLER VISISTAT 35W (STAPLE) IMPLANT
STOPCOCK 4 WAY LG BORE MALE ST (IV SETS) ×1 IMPLANT
SUT ETHIBOND 0 36 GRN (SUTURE) ×2 IMPLANT
SUT ETHIBOND 2 0 SH 36X2 (SUTURE) IMPLANT
SUT MNCRL AB 4-0 PS2 18 (SUTURE) ×1 IMPLANT
SUT SILK 0 SH 30 (SUTURE) ×1 IMPLANT
SUT VICRYL 0 UR6 27IN ABS (SUTURE) ×1 IMPLANT
SYR 30ML SLIP (SYRINGE) ×1 IMPLANT
TRAY FOLEY MTR SLVR 16FR STAT (SET/KITS/TRAYS/PACK) ×1 IMPLANT
TRAY LAPAROSCOPIC MC (CUSTOM PROCEDURE TRAY) ×1 IMPLANT
TROCAR ADV FIXATION 5X100MM (TROCAR) ×1 IMPLANT

## 2023-12-29 NOTE — Anesthesia Procedure Notes (Signed)
 Arterial Line Insertion Start/End4/29/2025 3:25 PM, 12/29/2023 3:31 PM Performed by: Erin Havers, MD, anesthesiologist  Patient location: OR. Preanesthetic checklist: patient identified, IV checked, site marked, risks and benefits discussed, surgical consent, monitors and equipment checked, pre-op evaluation, timeout performed and anesthesia consent Left, brachial was placed Catheter size: 20 G Hand hygiene performed  and maximum sterile barriers used   Attempts: 1 Procedure performed using ultrasound guided technique. Ultrasound Notes:anatomy identified, needle tip was noted to be adjacent to the nerve/plexus identified, no ultrasound evidence of intravascular and/or intraneural injection and image(s) printed for medical record Following insertion, dressing applied and Biopatch. Post procedure assessment: normal and unchanged  Patient tolerated the procedure well with no immediate complications.

## 2023-12-29 NOTE — Discharge Instructions (Signed)

## 2023-12-29 NOTE — Transfer of Care (Signed)
 Immediate Anesthesia Transfer of Care Note  Patient: Martin [name]  Procedure(s) Performed: REPAIR, HERNIA, HIATAL, ROBOT-ASSISTED  Patient Location: PACU  Anesthesia Type:General  Level of Consciousness: awake, drowsy, patient cooperative, and responds to stimulation  Airway & Oxygen Therapy: Patient Spontanous Breathing and Patient connected to nasal cannula oxygen  Post-op Assessment: Report given to RN, Post -op Vital signs reviewed and stable, Patient moving all extremities X 4, and Patient able to stick tongue midline  Post vital signs: Reviewed and stable  Last Vitals:  Vitals Value Taken Time  BP 162/81 12/29/23 1746  Temp 97.5   Pulse 61 12/29/23 1747  Resp 15 12/29/23 1747  SpO2 95 % 12/29/23 1747  Vitals shown include unfiled device data.  Last Pain:  Vitals:   12/29/23 1216  TempSrc:   PainSc: 0-No pain         Complications: No notable events documented.

## 2023-12-29 NOTE — Anesthesia Procedure Notes (Signed)
 Procedure Name: Intubation Date/Time: 12/29/2023 3:15 PM  Performed by: Alys Julian, CRNAPre-anesthesia Checklist: Patient identified, Emergency Drugs available, Suction available and Patient being monitored Patient Re-evaluated:Patient Re-evaluated prior to induction Oxygen Delivery Method: Circle system utilized Preoxygenation: Pre-oxygenation with 100% oxygen Induction Type: IV induction Ventilation: Mask ventilation without difficulty Laryngoscope Size: Miller and 2 Grade View: Grade I Tube type: Oral Tube size: 7.5 mm Number of attempts: 1 Airway Equipment and Method: Stylet and Oral airway Placement Confirmation: ETT inserted through vocal cords under direct vision, positive ETCO2 and breath sounds checked- equal and bilateral Secured at: 23 cm Tube secured with: Tape Dental Injury: Teeth and Oropharynx as per pre-operative assessment

## 2023-12-29 NOTE — H&P (Signed)
 istory of Present Illness The patient, a 78 year old male with a known hiatal hernia, presents for follow-up after a recent ER visit due to pain. The patient describes episodes of radiating pain that ascends into the chest, accompanied by nausea and vomiting. The vomiting typically occurs after two to three hours of pain and consists of undigested food. The patient reports no appetite and has been trying to maintain nutrition through consumption of Gatorade and soft foods. The patient also mentions recent cataract surgery and a history of stroke.   The patient sees dr hochrein and young for CABG and neurologist   CT shows a 4.2cm hiatal hernia with a large portion of the stomach in the chest cavity.   Review of Systems       Problem List There is no problem list on file for this patient.       Medications Prior to Visit Outpatient Medications Prior to Visit MedicationSigDispenseRefill            clopidogreL  (PLAVIX ) 75 mg tablet    Take 75 mg by mouth once daily                                mesalamine  (ASACOL  HD) 800 mg EC tablet           Take 800 mg by mouth                                    metoclopramide  (REGLAN ) 10 MG tablet      Take 10 mg by mouth                          nitroGLYcerin  (NITROSTAT ) 0.4 MG SL tablet         Place 0.4 mg under the tongue every 5 (five) minutes as needed                               omeprazole  (PRILOSEC ) 20 MG DR capsule           Take 1 capsule by mouth once daily                                traZODone  (DESYREL ) 50 MG tablet            Take 50 mg by mouth at bedtime                         No facility-administered medications prior to visit.             Objective   Vitals:             09/17/23 1413 PainSc:0-No pain PainLoc:Abdomen   There is no height or weight on file to calculate BMI.   Home Vitals:      Physical Exam Physical Exam     PE:   Constitutional: No acute distress, conversant, appears states age. Eyes:  Anicteric sclerae, moist conjunctiva, no lid lag Lungs: Clear to auscultation bilaterally, normal respiratory effort CV: regular rate and rhythm, no murmurs, no peripheral edema, pedal pulses 2+ GI: Soft, no masses or hepatosplenomegaly, non-tender to palpation Skin: No rashes, palpation reveals normal turgor Psychiatric: appropriate judgment  and insight, oriented to person, place, and time   Results RADIOLOGY Esophagram: Hiatal hernia with 50% of stomach in thoracic cavity           Assessment/Plan:   Assessment & Plan Hiatal Hernia   A recent ER visit was due to pain and vomiting, likely caused by the hernia leading to obstruction and reflux. Surgical intervention is necessary to prevent further episodes and complications. Plan for robotic hiatal hernia repair. Coordinate with a neurologist and cardiologist to temporarily discontinue Plavix  before surgery. Post-operative diet will progress from a pureed diet for 2 weeks, to a soft diet for 2 weeks, then a chopped diet for 2 weeks, before returning to a normal diet with modifications such as smaller bites and slower eating. A lifting restriction of no more than 20 pounds is advised for 6 weeks post-surgery.   We discussed the risk and benefits of the procedure to include on room to: Infection, bleeding, damage structures, possible recurrence.  Patient voiced understanding wishes to proceed.   Nutrition   There is poor appetite and difficulty maintaining adequate nutrition due to hernia symptoms. Encourage high protein intake, considering protein shakes or protein powder. Continue with tolerable foods like Gatorade and soft foods.   Anticoagulation   Plavix  is taken due to a previous stroke during cardiac surgery. Consult with a neurologist and cardiologist to ensure safe discontinuation of Plavix  prior to hernia repair surgery.   Diagnoses and all orders for this visit:   Hiatal hernia

## 2023-12-29 NOTE — Anesthesia Procedure Notes (Signed)
 Arterial Line Insertion Start/End4/29/2025 2:50 PM, 12/29/2023 3:00 PM Performed by: Erin Havers, MD, anesthesiologist  Patient location: Pre-op. Preanesthetic checklist: patient identified, IV checked, site marked, risks and benefits discussed, surgical consent, monitors and equipment checked, pre-op evaluation, timeout performed and anesthesia consent Lidocaine  1% used for infiltration Right, radial was placed Catheter size: 20 G Hand hygiene performed  and maximum sterile barriers used   Attempts: 1 Procedure performed using ultrasound guided technique. Ultrasound Notes:anatomy identified, needle tip was noted to be adjacent to the nerve/plexus identified, no ultrasound evidence of intravascular and/or intraneural injection and image(s) printed for medical record Following insertion, dressing applied and Biopatch. Post procedure assessment: normal and unchanged  Post procedure complications: unsuccessful attempts. Patient tolerated the procedure well with no immediate complications. Additional procedure comments: Attempt x2 by MDA under US  guidance.Aaron Aas

## 2023-12-29 NOTE — Op Note (Signed)
 12/29/2023  5:24 PM  PATIENT:  Martin [name]  78 y.o. male  PRE-OPERATIVE DIAGNOSIS:  HIATAL HERNIA  POST-OPERATIVE DIAGNOSIS:  HIATAL HERNIA  PROCEDURE:  Procedure(s) with comments: REPAIR, HERNIA, HIATAL, ROBOT-ASSISTED (N/A) - ROBOTIC HIATAL HERNIA REPAIR WITH TOUPET FUNDOPLICATION AND MESH  SURGEON:  Surgeons and Role:    Shela Derby, MD - Primary  ASSISTANTS: Woodroe Hazel, RNFA   ANESTHESIA:   local and general  EBL:  minimal   BLOOD ADMINISTERED:none  DRAINS: none   LOCAL MEDICATIONS USED:  BUPIVICAINE   SPECIMEN:  No Specimen  DISPOSITION OF SPECIMEN:  N/A  COUNTS:  YES  TOURNIQUET:  * No tourniquets in log *  DICTATION: .Dragon Dictation   Findings: Large hiatal hernia, Gore BIo A mesh placed at hiatus, toupet fundoplication  The patient was taken back to the operating room and placed in the supine position with bilateral SCDs in place. The patient was prepped and draped in the usual sterile fashion. After appropriate antibiotics were confirmed a timeout was called and all facts were verified.   A Veress needle technique was used to insufflate the abdomen to 15 mm of mercury the paramedian stab incision. Subsequent to this an 8 mm trocar was introduced as was a 8 millimeter camera. At this time the subsequent robotic trochars x3, were then placed adjacent to this trocar approximately 8-10 cm away. Each trocar was inserted under direct visualization, there were total of 4 trochars. A 12mm trocar was placed in the midclavicular line.  A 0vicryl was placed to help with closure at the end of the case.The assistant trocar was then placed in the right lower quadrant under direct visualization. The Nathanson retractor was then visualized inserted into the abdomen and the incision just to the left of the falciform ligament. This was then placed to retract the liver appropriately. At this time the patient was positioned in reverse Trendelenburg.   At this time the  robot patient cart was brought to the bedside and placed in good position and the arms were docked to the trochars appropriately. At this time I proceeded to incised the gastrohepatic ligament.  At this time I proceeded to mobilize the stomach inferiorly and visualize the right crus. The peritoneum over the right crus was incised and right crus was identified. I proceeded to dissect this inferiorly until the left crus was seen joining the right crus. Once the right crus was adequately dissected we turned our to the left crus which was dissected away. This required traction of the stomach to the right side.  There was some calcified fat that was alongside the left crus.  Once this was visualized we then proceeded to circumferentially dissect the esophagus away from the surrounding tissue. The anterior and posterior vagus was seen along the esophagus at the GE junction.  These were both preserved throughout the entire case.  At this time the phrenoesophageal fat pad was . There was a moderate-sized hiatal hernia seen. I mobilized the esophagus cephalad approximately 4-5 cm, clearing away the surrounding tissue. The anterior hernia sac was dissected away from the stomach and esophagus.  At this time we turned our attention to the greater curvature the stomach and the omentum was mobilized using the robotic vessel sealer. This was taken up to the greater curvature to the hiatus. This mobilized the entire greater curvature to allow mobilization and the wrap. I then proceeded to bring the greater curvature the stomach posterior to the esophagus, and a shoeshine technique  was used to evaluate the mobilization of the greater curvature.   At this time I proceeded to close the hiatus using interrupted 0 Ethibonds x 3. This brought together the hiatal closure without undue stricture to the esophagus.   A piece of Gore Bio A hiatal mesh was placed over the hiatal closure and sutured to the crus using 0 Ethibonds sutures x  3.  At this time the greater curvature was brought around the esophagus and sutured using 0 silk sutures interrupted fashion approximately 1 cm apart x3 on each side of the esophagus in a Toupet fashion. A left collar stitch was then used to gastropexy the stomach from the wrap to the diaphragm just lateral to the left crus as.  A second collar stitch was placed from the wrap to the right crus.  The wrap lay loose with no strangulation of the esophagus.  At this time the robot was undocked. The liver trocar was removed. At this time insufflation was evacuated. Skin was reapproximated for Monocryl subcuticular fashion. The skin was then dressed with Dermabond. The patient tolerated the procedure well and was taken to the recovery room in stable condition.   PLAN OF CARE: Admit for overnight observation  PATIENT DISPOSITION:  PACU - hemodynamically stable.   Delay start of Pharmacological VTE agent (>24hrs) due to surgical blood loss or risk of bleeding: not applicable

## 2023-12-30 ENCOUNTER — Observation Stay (HOSPITAL_COMMUNITY)

## 2023-12-30 ENCOUNTER — Encounter (HOSPITAL_COMMUNITY): Payer: Self-pay | Admitting: General Surgery

## 2023-12-30 DIAGNOSIS — K449 Diaphragmatic hernia without obstruction or gangrene: Secondary | ICD-10-CM | POA: Diagnosis not present

## 2023-12-30 LAB — BASIC METABOLIC PANEL WITH GFR
Anion gap: 8 (ref 5–15)
BUN: 14 mg/dL (ref 8–23)
CO2: 26 mmol/L (ref 22–32)
Calcium: 8.6 mg/dL — ABNORMAL LOW (ref 8.9–10.3)
Chloride: 105 mmol/L (ref 98–111)
Creatinine, Ser: 1.3 mg/dL — ABNORMAL HIGH (ref 0.61–1.24)
GFR, Estimated: 57 mL/min — ABNORMAL LOW (ref >60)
Glucose, Bld: 159 mg/dL — ABNORMAL HIGH (ref 70–99)
Potassium: 3.8 mmol/L (ref 3.5–5.1)
Sodium: 139 mmol/L (ref 135–145)

## 2023-12-30 LAB — CBC
HCT: 37.1 % — ABNORMAL LOW (ref 39.0–52.0)
Hemoglobin: 12 g/dL — ABNORMAL LOW (ref 13.0–17.0)
MCH: 29.9 pg (ref 26.0–34.0)
MCHC: 32.3 g/dL (ref 30.0–36.0)
MCV: 92.3 fL (ref 80.0–100.0)
Platelets: 213 10*3/uL (ref 150–400)
RBC: 4.02 MIL/uL — ABNORMAL LOW (ref 4.22–5.81)
RDW: 13.2 % (ref 11.5–15.5)
WBC: 10.1 10*3/uL (ref 4.0–10.5)
nRBC: 0 % (ref 0.0–0.2)

## 2023-12-30 MED ORDER — IOHEXOL 300 MG/ML  SOLN
100.0000 mL | Freq: Once | INTRAMUSCULAR | Status: AC | PRN
  Administered 2023-12-30: 60 mL via ORAL

## 2023-12-30 NOTE — Addendum Note (Signed)
 Addendum  created 12/30/23 1454 by Erin Havers, MD   Clinical Note Signed, Intraprocedure Blocks edited

## 2023-12-30 NOTE — Progress Notes (Signed)
 1 Day Post-Op   Subjective/Chief Complaint: Doing well  No pain Tol clears   Objective: Vital signs in last 24 hours: Temp:  [97.5 F (36.4 C)-98.2 F (36.8 C)] 97.5 F (36.4 C) (04/30 0728) Pulse Rate:  [55-72] 72 (04/30 0728) Resp:  [11-18] 16 (04/30 0728) BP: (123-175)/(61-89) 149/63 (04/30 0728) SpO2:  [91 %-100 %] 96 % (04/30 0728) Arterial Line BP: (150-170)/(74-78) 150/74 (04/29 1815) Weight:  [97.5 kg] 97.5 kg (04/29 1138)    Intake/Output from previous day: 04/29 0701 - 04/30 0700 In: 1583.1 [P.O.:100; I.V.:1283.1; IV Piggyback:200] Out: 450 [Urine:400; Blood:50] Intake/Output this shift: No intake/output data recorded.  General appearance: alert and cooperative GI: soft, non-tender; bowel sounds normal; no masses,  no organomegaly Inc c/d/i  Lab Results:  Pending THis AM  Studies/Results: No results found.  Anti-infectives: Anti-infectives (From admission, onward)    Start     Dose/Rate Route Frequency Ordered Stop   12/29/23 1445  ceFAZolin (ANCEF) IVPB 2g/100 mL premix        2 g 200 mL/hr over 30 Minutes Intravenous  Once 12/29/23 1435 12/29/23 1550   12/29/23 1435  ceFAZolin (ANCEF) 2-4 GM/100ML-% IVPB       Note to Pharmacy: Delphine Fiedler: cabinet override      12/29/23 1435 12/29/23 1558       Assessment/Plan: s/p Procedure(s) with comments: REPAIR, HERNIA, HIATAL, ROBOT-ASSISTED (N/A) - ROBOTIC HIATAL HERNIA REPAIR WITH FUNDOPLICATION AND MESH Pod 1 -await esophagram -f/u labs this AM -mobilize -if esophgram w/o leak OK to start pureed diet   LOS: 0 days    Martin [name] 12/30/2023

## 2023-12-30 NOTE — Plan of Care (Signed)

## 2023-12-30 NOTE — Progress Notes (Signed)
   12/30/23 1244  TOC Brief Assessment  Insurance and Status Reviewed  Patient has primary care physician Yes  Home environment has been reviewed spouse  Prior level of function: independent  Prior/Current Home Services No current home services  Social Drivers of Health Review SDOH reviewed no interventions necessary  Readmission risk has been reviewed No  Transition of care needs no transition of care needs at this time        Transition of Care Department [facility]) has reviewed patient and no TOC needs have been identified at this time. We will continue to monitor patient advancement through interdisciplinary progression rounds. If new patient transition needs arise, please place a TOC consult.

## 2023-12-30 NOTE — Discharge Summary (Signed)
 Physician Discharge Summary  Patient ID: Martin [name] [name]: [id number] DOB/AGE: [birth year] 78 y.o.  Admit date: 12/29/2023 Discharge date: 12/30/2023  Admission Diagnoses: hiatal hernia  Discharge Diagnoses:  Principal Problem:   S/P Nissen fundoplication (without gastrostomy tube) procedure   Discharged Condition: good  Hospital Course: PT admitted post op s/p HHR with fundoplication.  PT did well post op.  He had min to no pain.  He had eosphagram POD 1 which was normal w/o leak.  He was started on Pureed diet and tol that well.  He was deemed stable for DC and DC'd home  Consults: None  Significant Diagnostic Studies: radiology:  esophgram with no leak  Treatments: surgery: as aboive  Discharge Exam: Blood pressure (!) 149/63, pulse 72, temperature (!) 97.5 F (36.4 C), temperature source Oral, resp. rate 16, height 6\' 1"  (1.854 m), weight 97.5 kg, SpO2 96%. General appearance: alert and cooperative GI: soft, non-tender; bowel sounds normal; no masses,  no organomegaly and inc c/d/i  Disposition: Discharge disposition: 01-Home or Self Care       Discharge Instructions     Diet - low sodium heart healthy   Complete by: As directed    Increase activity slowly   Complete by: As directed       Allergies as of 12/30/2023       Reactions   Morphine  Other (See Comments)   Hot, sweats, flushed        Medication List     TAKE these medications    Asacol  HD 800 MG Tbec Generic drug: Mesalamine  Take 800 mg by mouth 3 (three) times daily.   clopidogrel  75 MG tablet Commonly known as: PLAVIX  Take 75 mg by mouth daily.   loratadine  10 MG tablet Commonly known as: CLARITIN  Take 10 mg by mouth daily.   metoCLOPramide  10 MG tablet Commonly known as: REGLAN  Take 1 tablet (10 mg total) by mouth every 6 (six) hours.   metoprolol  succinate 25 MG 24 hr tablet Commonly known as: TOPROL -XL Take 0.5 tablets (12.5 mg total) by mouth daily.   nitroGLYCERIN  0.4 MG  SL tablet Commonly known as: NITROSTAT  Place 1 tablet (0.4 mg total) under the tongue every 5 (five) minutes as needed for chest pain.   ondansetron  4 MG disintegrating tablet Commonly known as: ZOFRAN -ODT Take 1 tablet (4 mg total) by mouth every 8 (eight) hours as needed for nausea or vomiting.   pantoprazole  40 MG tablet Commonly known as: PROTONIX  Take 1 tablet (40 mg total) by mouth 2 (two) times daily. What changed: when to take this   rosuvastatin  40 MG tablet Commonly known as: CRESTOR  Take 1 tablet (40 mg total) by mouth daily. What changed: when to take this   tamsulosin  0.4 MG Caps capsule Commonly known as: FLOMAX  Take 0.4 mg by mouth daily.   traZODone  50 MG tablet Commonly known as: DESYREL  Take 50 mg by mouth at bedtime.        Follow-up Information     Shela Derby, MD. Schedule an appointment as soon as possible for a visit in 2 week(s).   Specialty: General Surgery Why: Post op visit Contact information: [address] [place] [address] [phone]                 Signed: Shela Derby 12/30/2023, 3:02 PM

## 2023-12-30 NOTE — Care Management Obs Status (Signed)
 MEDICARE OBSERVATION STATUS NOTIFICATION   Patient Details  Name: Martin [name] [name]: [id number] Date of Birth: [birth year]   Medicare Observation Status Notification Given:  Yes    Felix Host 12/30/2023, 3:58 PM

## 2023-12-30 NOTE — Progress Notes (Signed)
 4/30 Pt patient refused to sign after contents of the letter was explained. Letter was explained, read by patient and acknowledged.

## 2023-12-30 NOTE — Anesthesia Postprocedure Evaluation (Signed)
 Anesthesia Post Note  Patient: [name]  Procedure(s) Performed: REPAIR, HERNIA, HIATAL, ROBOT-ASSISTED     Patient location during evaluation: PACU Anesthesia Type: General Level of consciousness: awake and alert Pain management: pain level controlled Vital Signs Assessment: post-procedure vital signs reviewed and stable Respiratory status: spontaneous breathing, nonlabored ventilation, respiratory function stable and patient connected to nasal cannula oxygen Cardiovascular status: blood pressure returned to baseline and stable Postop Assessment: no apparent nausea or vomiting Anesthetic complications: no   No notable events documented.  Last Vitals:    Last Pain:                 Erin Havers

## 2023-12-30 NOTE — Progress Notes (Signed)
 Nutrition Education Note  RD consulted for nutrition education regarding diet instructions s/p Nissen (pureed diet for 2 weeks post-op).    RD provided diet advancement education handout from patient's surgery team for review. Reviewed patient's dietary recall. Provided examples on ways to modify foods on pureed diet. Encouraged pt to blend foods using a blender or food processor and strain to obtain desired consistency (no lumps). Also encouraged use of commercial pureed foods and oral nutritional supplements such as Ensure to provide adequate calories and protein. Discussed how to modify recipes to add calories and protein.    RD discussed why it is important for patient to adhere to diet recommendations and discussed possibility of diet advancement upon MD follow-up. Teach back method used. Family at bedside and questions answered.    Expect good compliance.   Body mass index is 32.78 kg/m. Pt meets criteria for obesity, class I based on current BMI.   Current diet order is puree diet. Labs and medications reviewed. No further nutrition interventions warranted at this time. RD contact information provided. If additional nutrition issues arise, please re-consult RD.    Con Decant MS, RD, LDN Registered Dietitian Clinical Nutrition RD Inpatient Contact Info in Amion

## 2024-01-04 ENCOUNTER — Other Ambulatory Visit: Payer: Self-pay

## 2024-01-04 DIAGNOSIS — I6523 Occlusion and stenosis of bilateral carotid arteries: Secondary | ICD-10-CM

## 2024-01-21 ENCOUNTER — Ambulatory Visit (HOSPITAL_BASED_OUTPATIENT_CLINIC_OR_DEPARTMENT_OTHER)
Admission: RE | Admit: 2024-01-21 | Discharge: 2024-01-21 | Disposition: A | Discharge status: Home / Self Care | Source: Ambulatory Visit | Attending: Surgery | Admitting: Surgery

## 2024-01-21 DIAGNOSIS — I6523 Occlusion and stenosis of bilateral carotid arteries: Secondary | ICD-10-CM | POA: Diagnosis not present

## 2024-01-21 MED ORDER — IOHEXOL 350 MG/ML SOLN
100.0000 mL | Freq: Once | INTRAVENOUS | Status: AC | PRN
  Administered 2024-01-21: 75 mL via INTRAVENOUS

## 2024-02-08 ENCOUNTER — Encounter: Payer: Self-pay | Admitting: Surgery

## 2024-02-08 ENCOUNTER — Ambulatory Visit: Payer: Medicare PPO | Attending: Surgery | Admitting: Surgery

## 2024-02-08 ENCOUNTER — Other Ambulatory Visit: Payer: Self-pay

## 2024-02-08 VITALS — BP 116/69 | HR 70 | Temp 98.1°F | Resp 20 | Ht 73.0 in | Wt 206.0 lb

## 2024-02-08 DIAGNOSIS — I6523 Occlusion and stenosis of bilateral carotid arteries: Secondary | ICD-10-CM

## 2024-02-08 NOTE — H&P (View-Only) (Signed)
 [facility] [place]  Patient name: Martin [name] [name]: [id number] DOB: 10-10-1945 Sex: male   REASON FOR VISIT:    Follow up  HISOTRY OF PRESENT ILLNESS:   DAVEON ARPINO is a 78 y.o. male, who is referred for evaluation of disease.  Patient states that since his CABG, he has not had issues with balance.  He does have a MRI from 2023 that shows a moderate-sized chronic right frontal, temporal, and occipital infarct.  He also has a record of having blurry vision and eye issues on the left that could potentially have represented TIAs.  The patient does not remember these and certainly has not had any recently.  It sounds like these occurred around the time of his cardiac surgery.  He has undergone heart monitor testing which has been unremarkable.  He denies any acute neurologic symptoms.  Specifically, he denies numbness or weakness in either extremity.  He denies slurred speech.  He denies amaurosis fugax.  I sent him for follow-up CT angiogram to better define his stenosis.  He recently underwent hiatal hernia surgery     Patient has a history of coronary artery disease, status post stenting and CABG he is a non-smoker.  He is medically managed for hypertension.  He takes a statin for hypercholesterolemia.  He has ulcerative colitis.  He also complains of some left hip pain.  He walks approximately 01-6999 steps a day   PAST MEDICAL HISTORY:   Past Medical History:  Diagnosis Date   Anemia    Iron Deficiency   Anxiety and depression    Benign prostatic hypertrophy    Cancer (HCC) 2020   Prostate Cancer   Coronary artery disease    cath 04/2007 with 90-95% RCA stenosis. Had DES placed.  Had an acute stent thrombosis in the hospital that day and subsequently had 3 DES placed in the RCA. The LAD had 45% stenosis, in the circ had 35% stenosis. The EF was well preserved.   Dyslipidemia    GERD (gastroesophageal reflux disease)     History of hiatal hernia    Hypercholesterolemia    Insomnia    Myocardial infarction [facility]) 2008   Peripheral vascular disease (HCC)    Carotid Stenosis - Followed by Dr. Charlotte Cookey   Stroke [facility])    TIA   Ulcerative colitis      FAMILY HISTORY:   Family History  Problem Relation Age of Onset   Heart attack Mother    Heart attack Father    Heart attack Brother    Colon cancer Neg Hx    Rectal cancer Neg Hx    Stomach cancer Neg Hx     SOCIAL HISTORY:   Social History   Tobacco Use   Smoking status: Never   Smokeless tobacco: Never  Substance Use Topics   Alcohol use: Not Currently    Alcohol/week: 2.0 standard drinks of alcohol    Types: 2 Standard drinks or equivalent per week     ALLERGIES:   Allergies  Allergen Reactions   Morphine  Other (See Comments)    Hot, sweats, flushed     CURRENT MEDICATIONS:   Current Outpatient Medications  Medication Sig Dispense Refill   ASACOL  HD 800 MG TBEC Take 800 mg by mouth 3 (three) times daily.  3   clopidogrel  (PLAVIX ) 75 MG tablet Take 75 mg by mouth daily.     loratadine  (CLARITIN ) 10 MG tablet Take 10 mg by mouth daily.  metoprolol  succinate (TOPROL -XL) 25 MG 24 hr tablet Take 0.5 tablets (12.5 mg total) by mouth daily. 45 tablet 3   nitroGLYCERIN  (NITROSTAT ) 0.4 MG SL tablet Place 1 tablet (0.4 mg total) under the tongue every 5 (five) minutes as needed for chest pain. 25 tablet 3   pantoprazole  (PROTONIX ) 40 MG tablet Take 1 tablet (40 mg total) by mouth 2 (two) times daily. (Patient taking differently: Take 40 mg by mouth daily.) 60 tablet 6   rosuvastatin  (CRESTOR ) 40 MG tablet Take 1 tablet (40 mg total) by mouth daily. (Patient taking differently: Take 40 mg by mouth at bedtime.) 90 tablet 3   tamsulosin  (FLOMAX ) 0.4 MG CAPS capsule Take 0.4 mg by mouth daily.     traZODone  (DESYREL ) 50 MG tablet Take 50 mg by mouth at bedtime.     metoCLOPramide  (REGLAN ) 10 MG tablet Take 1 tablet (10 mg total) by mouth  every 6 (six) hours. (Patient not taking: Reported on 12/17/2023) 30 tablet 0   ondansetron  (ZOFRAN -ODT) 4 MG disintegrating tablet Take 1 tablet (4 mg total) by mouth every 8 (eight) hours as needed for nausea or vomiting. (Patient not taking: Reported on 12/17/2023) 20 tablet 2   No current facility-administered medications for this visit.    REVIEW OF SYSTEMS:   [X]  denotes positive finding, [ ]  denotes negative finding Cardiac  Comments:  Chest pain or chest pressure:    Shortness of breath upon exertion:    Short of breath when lying flat:    Irregular heart rhythm:        Vascular    Pain in calf, thigh, or hip brought on by ambulation:    Pain in feet at night that wakes you up from your sleep:     Blood clot in your veins:    Leg swelling:         Pulmonary    Oxygen at home:    Productive cough:     Wheezing:         Neurologic    Sudden weakness in arms or legs:     Sudden numbness in arms or legs:     Sudden onset of difficulty speaking or slurred speech:    Temporary loss of vision in one eye:     Problems with dizziness:         Gastrointestinal    Blood in stool:     Vomited blood:         Genitourinary    Burning when urinating:     Blood in urine:        Psychiatric    Major depression:         Hematologic    Bleeding problems:    Problems with blood clotting too easily:        Skin    Rashes or ulcers:        Constitutional    Fever or chills:      PHYSICAL EXAM:   Vitals:   02/08/24 1026  BP: 116/69  Pulse: 70  Resp: 20  Temp: 98.1 F (36.7 C)  Weight: 206 lb (93.4 kg)  Height: 6\' 1"  (1.854 m)    GENERAL: The patient is a well-nourished male, in no acute distress. The vital signs are documented above. CARDIAC: There is a regular rate and rhythm.  PULMONARY: Non-labored respirations MUSCULOSKELETAL: There are no major deformities or cyanosis. NEUROLOGIC: No focal weakness or paresthesias are detected. SKIN: There are no ulcers or  rashes noted.  PSYCHIATRIC: The patient has a normal affect.  STUDIES:   I have reviewed the following CTA neck Calcified and noncalcified atherosclerosis at the right carotid bifurcation resulting in approximately 80% stenosis at the origin of the right cervical ICA.   Additional atherosclerosis at the left carotid bifurcation extending into the proximal cervical ICA. 1 cm distal to the bifurcation there is a focus of approximately 90% stenosis of the proximal left cervical ICA.   Mild stenosis at the origin of the left vertebral artery.   Mild stenosis at the origin of the left subclavian artery.   MEDICAL ISSUES:   Bilateral carotid stenosis, left greater than right: I discussed his for repair.  This included continue medical management, carotid endarterectomy, and TCAR.  After a well-informed discussion, we have elected to proceed with left-sided TCAR.  He is going to need bilateral interventions and this puts him at decreased risk for nerve injury.  In addition he has had trouble recovering from surgery with lack of energy and so this will help to minimize his recovery.  We did discuss the risk of stroke.  All questions were answered.  We will work on this in the near future again this will be the left side    Gareld June, IV, MD, FACS [facility] [phone] Pager [phone]

## 2024-02-08 NOTE — Progress Notes (Signed)
 [facility] [place]  Patient name: Martin [name] [name]: [id number] DOB: 10-10-1945 Sex: male   REASON FOR VISIT:    Follow up  HISOTRY OF PRESENT ILLNESS:   Martin [name] is a 78 y.o. male, who is referred for evaluation of disease.  Patient states that since his CABG, he has not had issues with balance.  He does have a MRI from 2023 that shows a moderate-sized chronic right frontal, temporal, and occipital infarct.  He also has a record of having blurry vision and eye issues on the left that could potentially have represented TIAs.  The patient does not remember these and certainly has not had any recently.  It sounds like these occurred around the time of his cardiac surgery.  He has undergone heart monitor testing which has been unremarkable.  He denies any acute neurologic symptoms.  Specifically, he denies numbness or weakness in either extremity.  He denies slurred speech.  He denies amaurosis fugax.  I sent him for follow-up CT angiogram to better define his stenosis.  He recently underwent hiatal hernia surgery     Patient has a history of coronary artery disease, status post stenting and CABG he is a non-smoker.  He is medically managed for hypertension.  He takes a statin for hypercholesterolemia.  He has ulcerative colitis.  He also complains of some left hip pain.  He walks approximately 01-6999 steps a day   PAST MEDICAL HISTORY:   Past Medical History:  Diagnosis Date   Anemia    Iron Deficiency   Anxiety and depression    Benign prostatic hypertrophy    Cancer (HCC) 2020   Prostate Cancer   Coronary artery disease    cath 04/2007 with 90-95% RCA stenosis. Had DES placed.  Had an acute stent thrombosis in the hospital that day and subsequently had 3 DES placed in the RCA. The LAD had 45% stenosis, in the circ had 35% stenosis. The EF was well preserved.   Dyslipidemia    GERD (gastroesophageal reflux disease)     History of hiatal hernia    Hypercholesterolemia    Insomnia    Myocardial infarction [facility]) 2008   Peripheral vascular disease (HCC)    Carotid Stenosis - Followed by Dr. Charlotte Cookey   Stroke [facility])    TIA   Ulcerative colitis      FAMILY HISTORY:   Family History  Problem Relation Age of Onset   Heart attack Mother    Heart attack Father    Heart attack Brother    Colon cancer Neg Hx    Rectal cancer Neg Hx    Stomach cancer Neg Hx     SOCIAL HISTORY:   Social History   Tobacco Use   Smoking status: Never   Smokeless tobacco: Never  Substance Use Topics   Alcohol use: Not Currently    Alcohol/week: 2.0 standard drinks of alcohol    Types: 2 Standard drinks or equivalent per week     ALLERGIES:   Allergies  Allergen Reactions   Morphine  Other (See Comments)    Hot, sweats, flushed     CURRENT MEDICATIONS:   Current Outpatient Medications  Medication Sig Dispense Refill   ASACOL  HD 800 MG TBEC Take 800 mg by mouth 3 (three) times daily.  3   clopidogrel  (PLAVIX ) 75 MG tablet Take 75 mg by mouth daily.     loratadine  (CLARITIN ) 10 MG tablet Take 10 mg by mouth daily.  metoprolol  succinate (TOPROL -XL) 25 MG 24 hr tablet Take 0.5 tablets (12.5 mg total) by mouth daily. 45 tablet 3   nitroGLYCERIN  (NITROSTAT ) 0.4 MG SL tablet Place 1 tablet (0.4 mg total) under the tongue every 5 (five) minutes as needed for chest pain. 25 tablet 3   pantoprazole  (PROTONIX ) 40 MG tablet Take 1 tablet (40 mg total) by mouth 2 (two) times daily. (Patient taking differently: Take 40 mg by mouth daily.) 60 tablet 6   rosuvastatin  (CRESTOR ) 40 MG tablet Take 1 tablet (40 mg total) by mouth daily. (Patient taking differently: Take 40 mg by mouth at bedtime.) 90 tablet 3   tamsulosin  (FLOMAX ) 0.4 MG CAPS capsule Take 0.4 mg by mouth daily.     traZODone  (DESYREL ) 50 MG tablet Take 50 mg by mouth at bedtime.     metoCLOPramide  (REGLAN ) 10 MG tablet Take 1 tablet (10 mg total) by mouth  every 6 (six) hours. (Patient not taking: Reported on 12/17/2023) 30 tablet 0   ondansetron  (ZOFRAN -ODT) 4 MG disintegrating tablet Take 1 tablet (4 mg total) by mouth every 8 (eight) hours as needed for nausea or vomiting. (Patient not taking: Reported on 12/17/2023) 20 tablet 2   No current facility-administered medications for this visit.    REVIEW OF SYSTEMS:   [X]  denotes positive finding, [ ]  denotes negative finding Cardiac  Comments:  Chest pain or chest pressure:    Shortness of breath upon exertion:    Short of breath when lying flat:    Irregular heart rhythm:        Vascular    Pain in calf, thigh, or hip brought on by ambulation:    Pain in feet at night that wakes you up from your sleep:     Blood clot in your veins:    Leg swelling:         Pulmonary    Oxygen at home:    Productive cough:     Wheezing:         Neurologic    Sudden weakness in arms or legs:     Sudden numbness in arms or legs:     Sudden onset of difficulty speaking or slurred speech:    Temporary loss of vision in one eye:     Problems with dizziness:         Gastrointestinal    Blood in stool:     Vomited blood:         Genitourinary    Burning when urinating:     Blood in urine:        Psychiatric    Major depression:         Hematologic    Bleeding problems:    Problems with blood clotting too easily:        Skin    Rashes or ulcers:        Constitutional    Fever or chills:      PHYSICAL EXAM:   Vitals:   02/08/24 1026  BP: 116/69  Pulse: 70  Resp: 20  Temp: 98.1 F (36.7 C)  Weight: 206 lb (93.4 kg)  Height: 6\' 1"  (1.854 m)    GENERAL: The patient is a well-nourished male, in no acute distress. The vital signs are documented above. CARDIAC: There is a regular rate and rhythm.  PULMONARY: Non-labored respirations MUSCULOSKELETAL: There are no major deformities or cyanosis. NEUROLOGIC: No focal weakness or paresthesias are detected. SKIN: There are no ulcers or  rashes noted.  PSYCHIATRIC: The patient has a normal affect.  STUDIES:   I have reviewed the following CTA neck Calcified and noncalcified atherosclerosis at the right carotid bifurcation resulting in approximately 80% stenosis at the origin of the right cervical ICA.   Additional atherosclerosis at the left carotid bifurcation extending into the proximal cervical ICA. 1 cm distal to the bifurcation there is a focus of approximately 90% stenosis of the proximal left cervical ICA.   Mild stenosis at the origin of the left vertebral artery.   Mild stenosis at the origin of the left subclavian artery.   MEDICAL ISSUES:   Bilateral carotid stenosis, left greater than right: I discussed his for repair.  This included continue medical management, carotid endarterectomy, and TCAR.  After a well-informed discussion, we have elected to proceed with left-sided TCAR.  He is going to need bilateral interventions and this puts him at decreased risk for nerve injury.  In addition he has had trouble recovering from surgery with lack of energy and so this will help to minimize his recovery.  We did discuss the risk of stroke.  All questions were answered.  We will work on this in the near future again this will be the left side    Gareld June, IV, MD, FACS [facility] [phone] Pager [phone]

## 2024-02-16 NOTE — Pre-Procedure Instructions (Signed)
 Surgical Instructions   Your procedure is scheduled on Wednesday, June 25th. Report to [facility] Main Entrance A at 05:30 A.M., then check in with the Admitting office. Any questions or running late day of surgery: call [phone]  Questions prior to your surgery date: call [phone], Monday-Friday, 8am-4pm. If you experience any cold or flu symptoms such as cough, fever, chills, shortness of breath, etc. between now and your scheduled surgery, please notify us  at the above number.     Remember:  Do not eat or drink after midnight the night before your surgery    Take these medicines the morning of surgery with A SIP OF WATER   aspirin  EC  clopidogrel  (PLAVIX )  loratadine  (CLARITIN )   May take these medicines IF NEEDED: nitroGLYCERIN  (NITROSTAT )- If you have to take this medication prior to surgery, please call [phone] and report this to a nurse    YOU MAY CONTINUE TO TAKE ASPIRIN  AND PLAVIX , per surgeon.  One week prior to surgery, STOP taking any Aleve, Naproxen, Ibuprofen, Motrin, Advil, Goody's, BC's, all herbal medications, fish oil, and non-prescription vitamins.                     Do NOT Smoke (Tobacco/Vaping) for 24 hours prior to your procedure.  If you use a CPAP at night, you may bring your mask/headgear for your overnight stay.   You will be asked to remove any contacts, glasses, piercing's, hearing aid's, dentures/partials prior to surgery. Please bring cases for these items if needed.    Patients discharged the day of surgery will not be allowed to drive home, and someone needs to stay with them for 24 hours.  SURGICAL WAITING ROOM VISITATION Patients may have no more than 2 support people in the waiting area - these visitors may rotate.   Pre-op nurse will coordinate an appropriate time for 1 ADULT support person, who may not rotate, to accompany patient in pre-op.  Children under the age of 71 must have an adult with them who is not the patient and  must remain in the main waiting area with an adult.  If the patient needs to stay at the hospital during part of their recovery, the visitor guidelines for inpatient rooms apply.  Please refer to the [facility] website for the visitor guidelines for any additional information.   If you received a COVID test during your pre-op visit  it is requested that you wear a mask when out in public, stay away from anyone that may not be feeling well and notify your surgeon if you develop symptoms. If you have been in contact with anyone that has tested positive in the last 10 days please notify you surgeon.      Pre-operative CHG Bathing Instructions   You can play a key role in reducing the risk of infection after surgery. Your skin needs to be as free of germs as possible. You can reduce the number of germs on your skin by washing with CHG (chlorhexidine  gluconate) soap before surgery. CHG is an antiseptic soap that kills germs and continues to kill germs even after washing.   DO NOT use if you have an allergy to chlorhexidine /CHG or antibacterial soaps. If your skin becomes reddened or irritated, stop using the CHG and notify one of our RNs at [phone].              TAKE A SHOWER THE NIGHT BEFORE SURGERY AND THE DAY OF SURGERY    Please  keep in mind the following:  DO NOT shave, including legs and underarms, 48 hours prior to surgery.   You may shave your face before/day of surgery.  Place clean sheets on your bed the night before surgery Use a clean washcloth (not used since being washed) for each shower. DO NOT sleep with pet's night before surgery.  CHG Shower Instructions:  Wash your face and private area with normal soap. If you choose to wash your hair, wash first with your normal shampoo.  After you use shampoo/soap, rinse your hair and body thoroughly to remove shampoo/soap residue.  Turn the water  OFF and apply half the bottle of CHG soap to a CLEAN washcloth.  Apply CHG soap  ONLY FROM YOUR NECK DOWN TO YOUR TOES (washing for 3-5 minutes)  DO NOT use CHG soap on face, private areas, open wounds, or sores.  Pay special attention to the area where your surgery is being performed.  If you are having back surgery, having someone wash your back for you may be helpful. Wait 2 minutes after CHG soap is applied, then you may rinse off the CHG soap.  Pat dry with a clean towel  Put on clean pajamas    Additional instructions for the day of surgery: DO NOT APPLY any lotions, deodorants, cologne, or perfumes.   Do not wear jewelry or makeup Do not wear nail polish, gel polish, artificial nails, or any other type of covering on natural nails (fingers and toes) Do not bring valuables to the hospital. The [facility] is not responsible for valuables/personal belongings. Put on clean/comfortable clothes.  Please brush your teeth.  Ask your nurse before applying any prescription medications to the skin.

## 2024-02-17 ENCOUNTER — Encounter (HOSPITAL_COMMUNITY)
Admission: RE | Admit: 2024-02-17 | Discharge: 2024-02-17 | Disposition: A | Discharge status: Home / Self Care | Source: Ambulatory Visit | Attending: Surgery | Admitting: Surgery

## 2024-02-17 ENCOUNTER — Other Ambulatory Visit: Payer: Self-pay

## 2024-02-17 ENCOUNTER — Encounter (HOSPITAL_COMMUNITY): Payer: Self-pay

## 2024-02-17 VITALS — BP 129/63 | HR 66 | Temp 98.1°F | Resp 18 | Ht 73.0 in | Wt 210.0 lb

## 2024-02-17 DIAGNOSIS — Z7902 Long term (current) use of antithrombotics/antiplatelets: Secondary | ICD-10-CM | POA: Insufficient documentation

## 2024-02-17 DIAGNOSIS — Z955 Presence of coronary angioplasty implant and graft: Secondary | ICD-10-CM | POA: Insufficient documentation

## 2024-02-17 DIAGNOSIS — I2510 Atherosclerotic heart disease of native coronary artery without angina pectoris: Secondary | ICD-10-CM | POA: Diagnosis not present

## 2024-02-17 DIAGNOSIS — Z8546 Personal history of malignant neoplasm of prostate: Secondary | ICD-10-CM | POA: Diagnosis not present

## 2024-02-17 DIAGNOSIS — K5190 Ulcerative colitis, unspecified, without complications: Secondary | ICD-10-CM | POA: Insufficient documentation

## 2024-02-17 DIAGNOSIS — Z01818 Encounter for other preprocedural examination: Secondary | ICD-10-CM

## 2024-02-17 DIAGNOSIS — Z951 Presence of aortocoronary bypass graft: Secondary | ICD-10-CM | POA: Insufficient documentation

## 2024-02-17 DIAGNOSIS — Z01812 Encounter for preprocedural laboratory examination: Secondary | ICD-10-CM | POA: Diagnosis not present

## 2024-02-17 DIAGNOSIS — I6523 Occlusion and stenosis of bilateral carotid arteries: Secondary | ICD-10-CM | POA: Diagnosis not present

## 2024-02-17 DIAGNOSIS — K219 Gastro-esophageal reflux disease without esophagitis: Secondary | ICD-10-CM | POA: Insufficient documentation

## 2024-02-17 DIAGNOSIS — I5022 Chronic systolic (congestive) heart failure: Secondary | ICD-10-CM | POA: Insufficient documentation

## 2024-02-17 DIAGNOSIS — K449 Diaphragmatic hernia without obstruction or gangrene: Secondary | ICD-10-CM | POA: Diagnosis not present

## 2024-02-17 DIAGNOSIS — Z8673 Personal history of transient ischemic attack (TIA), and cerebral infarction without residual deficits: Secondary | ICD-10-CM | POA: Insufficient documentation

## 2024-02-17 DIAGNOSIS — E7800 Pure hypercholesterolemia, unspecified: Secondary | ICD-10-CM | POA: Diagnosis not present

## 2024-02-17 DIAGNOSIS — Z7982 Long term (current) use of aspirin: Secondary | ICD-10-CM | POA: Insufficient documentation

## 2024-02-17 DIAGNOSIS — D649 Anemia, unspecified: Secondary | ICD-10-CM | POA: Insufficient documentation

## 2024-02-17 LAB — CBC
HCT: 37.7 % — ABNORMAL LOW (ref 39.0–52.0)
Hemoglobin: 11.5 g/dL — ABNORMAL LOW (ref 13.0–17.0)
MCH: 28.9 pg (ref 26.0–34.0)
MCHC: 30.5 g/dL (ref 30.0–36.0)
MCV: 94.7 fL (ref 80.0–100.0)
Platelets: 259 10*3/uL (ref 150–400)
RBC: 3.98 MIL/uL — ABNORMAL LOW (ref 4.22–5.81)
RDW: 14.2 % (ref 11.5–15.5)
WBC: 6.5 10*3/uL (ref 4.0–10.5)
nRBC: 0 % (ref 0.0–0.2)

## 2024-02-17 LAB — TYPE AND SCREEN
ABO/RH(D): A NEG
Antibody Screen: NEGATIVE

## 2024-02-17 LAB — COMPREHENSIVE METABOLIC PANEL WITH GFR
ALT: 17 U/L (ref 0–44)
AST: 18 U/L (ref 15–41)
Albumin: 3.5 g/dL (ref 3.5–5.0)
Alkaline Phosphatase: 73 U/L (ref 38–126)
Anion gap: 8 (ref 5–15)
BUN: 15 mg/dL (ref 8–23)
CO2: 26 mmol/L (ref 22–32)
Calcium: 9.3 mg/dL (ref 8.9–10.3)
Chloride: 107 mmol/L (ref 98–111)
Creatinine, Ser: 1.2 mg/dL (ref 0.61–1.24)
GFR, Estimated: >60 mL/min (ref >60)
Glucose, Bld: 115 mg/dL — ABNORMAL HIGH (ref 70–99)
Potassium: 4 mmol/L (ref 3.5–5.1)
Sodium: 141 mmol/L (ref 135–145)
Total Bilirubin: 1 mg/dL (ref 0.0–1.2)
Total Protein: 6.9 g/dL (ref 6.5–8.1)

## 2024-02-17 LAB — URINALYSIS, ROUTINE W REFLEX MICROSCOPIC
Bilirubin Urine: NEGATIVE
Glucose, UA: NEGATIVE mg/dL
Hgb urine dipstick: NEGATIVE
Ketones, ur: NEGATIVE mg/dL
Leukocytes,Ua: NEGATIVE
Nitrite: NEGATIVE
Protein, ur: NEGATIVE mg/dL
Specific Gravity, Urine: 1.01 (ref 1.005–1.030)
pH: 5 (ref 5.0–8.0)

## 2024-02-17 LAB — SURGICAL PCR SCREEN
MRSA, PCR: NEGATIVE
Staphylococcus aureus: POSITIVE — AB

## 2024-02-17 LAB — APTT: aPTT: 28 s (ref 24–36)

## 2024-02-17 NOTE — Progress Notes (Addendum)
 PCP - Dr. Magdaleno Schooling Cardiologist - Dr. Eilleen Grates - last office visit 07/15/2023 Neurologist - Dr. Arlington Bennetts - Last office visit 01/29/2023   PPM/ICD - denies Device Orders -  Rep Notified -   Chest x-ray - na EKG - 07/23/23 Stress Test - 11/09/22 ECHO - 07/25/23 Cardiac Cath - 12/05/20  Sleep Study - denies CPAP -   Fasting Blood Sugar - na Checks Blood Sugar _____ times a day  Last dose of GLP1 agonist-  na GLP1 instructions:   Blood Thinner Instructions:continue Plavix  per Dr. Charlotte Cookey Aspirin  Instructions:continue Aspirin  per Dr. Charlotte Cookey  ERAS Protcol -no PRE-SURGERY Ensure or G2-   COVID TEST- no   Anesthesia review: yes- history MI,stents,CABG. Reviewed by Margretta Shi 12/21/23. No new events.   Patient denies shortness of breath, fever, cough and chest pain at PAT appointment   All instructions explained to the patient, with a verbal understanding of the material. Patient agrees to go over the instructions while at home for a better understanding.The opportunity to ask questions was provided.

## 2024-02-18 NOTE — Progress Notes (Signed)
 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   He had a preoperative cardiology evaluation on 09/30/2023 followed by Marlyse Single, PA-C. S/p CABG in 10/2020. He had abdominal/atypical chest pain with slightly elevated troponinins with flat trends in 07/2023. Work-up revealing a large hiatal hernia with the stomach intrathoracic in location on the left. Since then, he was doing well from a cardiac standpoint. He was felt to be acceptable risk with no additional preoperative CV testing recommended prior to that surgery.    He is on Plavix  for history of CVA (chronic infarcts noted on 05/2022 MRI, thought to have likely occurred post CABG) and is followed by neurology. Vascular surgery is following bilateral carotid artery stenosis, 60-79% in 08/2023. CTA neck in 12/2023 showed ~ 80% RICA and ~ 90% LICA. Bilateral interventions discussed with plan to proceed with left TCAR.   Anesthesia team to evaluate on the day of surgery. He remains on ASA and Plavix  for the procedure.    VS: BP 129/63   Pulse 66   Temp 36.7 C   Resp 18   Ht 6' 1 (1.854 m)   Wt 95.3 kg   SpO2 98%   BMI  27.71 kg/m   PROVIDERS: Magdaleno Schooling, MD is PCP  Eilleen Grates, MD is cardiologist Caye Cocks, MD is vascular surgeon. Six month follow-up per 08/09/24 visit. Lajuan Pila, MD is GI Arlington Bennetts, DO is neurologist Glorious Larry)    LABS: Labs reviewed: Acceptable for surgery. (all labs ordered are listed, but only abnormal results are displayed)  Labs Reviewed  SURGICAL PCR SCREEN - Abnormal; Notable for the following components:      Result Value   Staphylococcus aureus POSITIVE (*)    All other components within normal limits  CBC - Abnormal; Notable for the following components:   RBC 3.98 (*)    Hemoglobin 11.5 (*)    HCT 37.7 (*)    All other components within normal limits  COMPREHENSIVE METABOLIC PANEL WITH GFR - Abnormal; Notable for the following components:   Glucose, Bld 115 (*)    All other components within normal limits  APTT  URINALYSIS, ROUTINE W REFLEX MICROSCOPIC  TYPE AND SCREEN    IMAGES: CTA Neck 01/21/24:  IMPRESSION: - Calcified and noncalcified atherosclerosis at the right carotid bifurcation resulting in approximately 80% stenosis at the origin of the right cervical ICA. - Additional atherosclerosis at the left carotid bifurcation extending into the proximal cervical ICA. 1 cm distal to the bifurcation there is a focus of approximately 90% stenosis of the proximal left cervical ICA. - Mild stenosis at the origin of the left vertebral artery. - Mild stenosis at the origin of the left subclavian artery. - 1.1 cm nodule adjacent to the left parotid gland which  may reflect a periparotid lymph node. Consider ultrasound for further evaluation. - Aortic Atherosclerosis (ICD10-I70.0) and Emphysema (ICD10-J43.9).   CTA Chest/abd/pelvis 07/24/23 (Pre-hiatal hernia repair): IMPRESSION:     1. Atherosclerotic calcification with no evidence of aneurysm or dissection. 2. Large hiatal hernia with the stomach intrathoracic in location on the  left. 3. Cholelithiasis. 4. Emphysema.   MRI Brain 05/12/22 (Canopy/PACS): IMPRESSION: No recent infarction, hemorrhage, or mass. Chronic infarcts detailed above.      EKG: 07/23/23: Normal sinus rhythm Rightward axis T wave abnormality, consider inferior ischemia Abnormal ECG Confirmed by Kelsey Patricia [phone]) on 07/23/2023 11:10:10 PM     CV: US  Carotid 08/10/23: Summary:  Right Carotid: Velocities in the right ICA are consistent with a 60-79% stenosis.  Left Carotid: Velocities in the left ICA are consistent with a 60-79%  stenosis.  Vertebrals: Bilateral vertebral arteries demonstrate antegrade flow.  Subclavians: Right subclavian artery was stenotic. Normal flow hemodynamics were seen in the left subclavian artery.      Echo 07/25/23: IMPRESSIONS   1. Left ventricular ejection fraction, by estimation, is 50 to 55%. The  left ventricle has low normal function. The left ventricle demonstrates  regional wall motion abnormalities (The apical lateral segment and apical anterior segment are akinetic). Left ventricular diastolic   parameters are consistent with Grade I diastolic dysfunction (impaired  relaxation).   2. Right ventricular systolic function is normal. The right ventricular  size is normal.   3. The mitral valve is normal in structure. No evidence of mitral valve  regurgitation. No evidence of mitral stenosis.   4. The aortic valve is grossly normal. Aortic valve regurgitation is  trivial. No aortic stenosis is present.   5. The inferior vena cava is normal in size with greater than 50%  respiratory variability, suggesting right atrial pressure of 3 mmHg.  - Comparison 03/26/21 LVEF 60-65%, no RWMA, grade 1 DD, mildly reduced RVSF; 12/05/20 LVEF 45-50%, severe hypokinesis of mid-apical anterior wall and anteroseptal wall, severe akinesis of the of the entire apical segment     RLE Venous US  06/04/23 (Canopy/PACS): IMPRESSION: No evidence of right lower extremity  deep venous thrombosis.     Cardiac event monitor 07/17/22 - 12/15/223: Normal sinus rhythm One 3 beat run of non sustained ventricular tachycardia Rare isolated premature ventricular contractions No sustained arrhythmias No symptoms reported.      RHC 12/05/20: Findings: Ao = 121/47 (55) LV = 125/10 RA = 1 RV =15/1 PA = 14/2 (8) PCW = 7 Fick cardiac output/index = 5.7/2.6 PVR = 0.2 WU Ao sat = 99% PA sat = 61%   Simultaneous RV/LV pressures show concordance with deep breathing   Assessment: 1. Low filling pressures with normal cardiac output and no evidence of tamponade.   Plan/Discussion: Continue medical therapy.     Last LHC was on 11/20/20 pre-CABG.    Past Medical History:  Diagnosis Date   Anemia    Iron Deficiency   Anxiety and depression    Benign prostatic hypertrophy    Cancer (HCC) 2020   Prostate Cancer   Coronary artery disease    cath 04/2007 with 90-95% RCA stenosis. Had DES placed.  Had an acute stent thrombosis in the hospital that day and subsequently had 3 DES placed in the RCA. The LAD had 45% stenosis, in the circ had 35% stenosis. The EF was well preserved.   Dyslipidemia    GERD (gastroesophageal reflux disease)    History of hiatal hernia  Hypercholesterolemia    Insomnia    Myocardial infarction [facility]) 2008   Peripheral vascular disease (HCC)    Carotid Stenosis - Followed by Dr. Charlotte Cookey   Stroke [facility])    TIA   Ulcerative colitis     Past Surgical History:  Procedure Laterality Date   BIOPSY  07/25/2023   Procedure: BIOPSY;  Surgeon: Annis Kinder, DO;  [facility]: [facility] ENDOSCOPY;  Service: Gastroenterology;;  Barretts and H.Pylori r/o   CATARACT EXTRACTION Bilateral 2025   COLONOSCOPY  10/06/2016   Colonic polyps status post polypectomy. Previously diagnosed left sided ulcerative colitis now wiith only smudged vascular pattern (inactive endoscopically)   CORONARY ARTERY BYPASS GRAFT N/A 11/21/2020   Procedure: CORONARY ARTERY  BYPASS GRAFTING (CABG) TIMES FOUR ON PUMP USING BILATERAL INTERNAL MAMMARY ARTERIES AND LEFT RADIAL ARTERY;  Surgeon: Rudine Cos, MD;  Location: [facility] OR;  Service: Open Heart Surgery;  Laterality: N/A;  BIMA   ESOPHAGOGASTRODUODENOSCOPY (EGD) WITH PROPOFOL  N/A 07/25/2023   Procedure: ESOPHAGOGASTRODUODENOSCOPY (EGD) WITH PROPOFOL ;  Surgeon: Annis Kinder, DO;  [facility];  Service: Gastroenterology;  Laterality: N/A;   INGUINAL HERNIA REPAIR Left 1999   INSERTION PROSTATE RADIATION SEED  2020   LEFT HEART CATH AND CORONARY ANGIOGRAPHY N/A 11/20/2020   Procedure: LEFT HEART CATH AND CORONARY ANGIOGRAPHY;  Surgeon: Millicent Ally, MD;  [facility] INVASIVE CV LAB;  Service: Cardiovascular;  Laterality: N/A;   RADIAL ARTERY HARVEST Left 11/21/2020   Procedure: RADIAL ARTERY HARVEST;  Surgeon: Rudine Cos, MD;  Location[facility];  Service: Open Heart Surgery;  Laterality: Left;   RIGHT HEART CATH N/A 12/05/2020   Procedure: RIGHT HEART CATH;  Surgeon: Mardell Shade, MD;  [facility] INVAS[facility] CV LAB;  Service: Cardiovascular;  Laterality: N/A;   ROTATOR CUFF REPAIR Left    TEE WITHOUT CARDIOVERSION N/A 11/21/2020   Procedure: TRANSESOPHAGEAL ECHOCARDIOGRAM (TEE);  Surgeon: Rudine Cos, MD;  Location[facility] OR;  Service: Open Heart Surgery;  Laterality: N/A;   XI ROBOTIC ASSISTED HIATAL HERNIA REPAIR N/A 12/29/2023   Procedure: REPAIR, HERNIA, HIATAL, ROBOT-ASSISTED;  Surgeon: Shela Derby, MD;  Location: [facility] OR;  Service: General;  Laterality: N/A;  ROBOTIC HIATAL HERNIA REPAIR WITH FUNDOPLICATION AND MESH    MEDICATIONS:  aspirin  EC 81 MG tablet   clopidogrel  (PLAVIX ) 75 MG tablet   loratadine  (CLARITIN ) 10 MG tablet   metoCLOPramide  (REGLAN ) 10 MG tablet   metoprolol  succinate (TOPROL -XL) 25 MG 24 hr tablet   mirtazapine (REMERON) 15 MG tablet   nitroGLYCERIN  (NITROSTAT ) 0.4 MG SL tablet   pantoprazole  (PROTONIX ) 40 MG tablet   rosuvastatin  (CRESTOR )  40 MG tablet   traZODone  (DESYREL ) 50 MG tablet   No current facility-administered medications for this encounter.    Ella Gun, PA-C Surgical Short Stay/Anesthesiology [facility] Phone [phone] [facility] Phone [phone] 02/18/2024 3:57 PM

## 2024-02-18 NOTE — Anesthesia Preprocedure Evaluation (Signed)
 Anesthesia Evaluation    Airway        Dental   Pulmonary           Cardiovascular      Neuro/Psych    GI/Hepatic   Endo/Other    Renal/GU      Musculoskeletal   Abdominal   Peds  Hematology   Anesthesia Other Findings   Reproductive/Obstetrics                             Anesthesia Physical Anesthesia Plan  ASA:   Anesthesia Plan:    Post-op Pain Management:    Induction:   PONV Risk Score and Plan:   Airway Management Planned:   Additional Equipment:   Intra-op Plan:   Post-operative Plan:   Informed Consent:   Plan Discussed with:   Anesthesia Plan Comments: (PAT note written 02/18/2024 by Eldred Sooy, PA-C.  )       Anesthesia Quick Evaluation

## 2024-02-24 ENCOUNTER — Encounter (HOSPITAL_COMMUNITY)
Admission: RE | Disposition: A | Discharge status: Home / Self Care | Payer: Self-pay | Source: Home / Self Care | Attending: Surgery

## 2024-02-24 ENCOUNTER — Inpatient Hospital Stay (HOSPITAL_COMMUNITY): Payer: Self-pay | Admitting: Vascular Surgery

## 2024-02-24 ENCOUNTER — Inpatient Hospital Stay (HOSPITAL_COMMUNITY)
Admission: RE | Admit: 2024-02-24 | Discharge: 2024-02-25 | DRG: 035 | Disposition: A | Discharge status: Home / Self Care | Attending: Surgery | Admitting: Surgery

## 2024-02-24 ENCOUNTER — Inpatient Hospital Stay (HOSPITAL_COMMUNITY): Payer: Self-pay | Admitting: Anesthesiology

## 2024-02-24 ENCOUNTER — Other Ambulatory Visit: Payer: Self-pay

## 2024-02-24 ENCOUNTER — Encounter (HOSPITAL_COMMUNITY): Payer: Self-pay | Admitting: Surgery

## 2024-02-24 ENCOUNTER — Inpatient Hospital Stay (HOSPITAL_COMMUNITY)

## 2024-02-24 DIAGNOSIS — Z006 Encounter for examination for normal comparison and control in clinical research program: Secondary | ICD-10-CM | POA: Diagnosis not present

## 2024-02-24 DIAGNOSIS — Z7982 Long term (current) use of aspirin: Secondary | ICD-10-CM | POA: Diagnosis not present

## 2024-02-24 DIAGNOSIS — Z8249 Family history of ischemic heart disease and other diseases of the circulatory system: Secondary | ICD-10-CM

## 2024-02-24 DIAGNOSIS — I2510 Atherosclerotic heart disease of native coronary artery without angina pectoris: Secondary | ICD-10-CM

## 2024-02-24 DIAGNOSIS — I252 Old myocardial infarction: Secondary | ICD-10-CM | POA: Diagnosis not present

## 2024-02-24 DIAGNOSIS — N400 Enlarged prostate without lower urinary tract symptoms: Secondary | ICD-10-CM | POA: Diagnosis present

## 2024-02-24 DIAGNOSIS — Z951 Presence of aortocoronary bypass graft: Secondary | ICD-10-CM | POA: Diagnosis not present

## 2024-02-24 DIAGNOSIS — I10 Essential (primary) hypertension: Secondary | ICD-10-CM | POA: Diagnosis present

## 2024-02-24 DIAGNOSIS — I6523 Occlusion and stenosis of bilateral carotid arteries: Secondary | ICD-10-CM | POA: Diagnosis present

## 2024-02-24 DIAGNOSIS — E7800 Pure hypercholesterolemia, unspecified: Secondary | ICD-10-CM | POA: Diagnosis present

## 2024-02-24 DIAGNOSIS — I509 Heart failure, unspecified: Secondary | ICD-10-CM

## 2024-02-24 DIAGNOSIS — Z9889 Other specified postprocedural states: Principal | ICD-10-CM

## 2024-02-24 DIAGNOSIS — Z8546 Personal history of malignant neoplasm of prostate: Secondary | ICD-10-CM

## 2024-02-24 DIAGNOSIS — K219 Gastro-esophageal reflux disease without esophagitis: Secondary | ICD-10-CM | POA: Diagnosis present

## 2024-02-24 DIAGNOSIS — I6529 Occlusion and stenosis of unspecified carotid artery: Secondary | ICD-10-CM | POA: Diagnosis present

## 2024-02-24 DIAGNOSIS — Z8673 Personal history of transient ischemic attack (TIA), and cerebral infarction without residual deficits: Secondary | ICD-10-CM | POA: Diagnosis not present

## 2024-02-24 DIAGNOSIS — Z7902 Long term (current) use of antithrombotics/antiplatelets: Secondary | ICD-10-CM | POA: Diagnosis not present

## 2024-02-24 DIAGNOSIS — I6522 Occlusion and stenosis of left carotid artery: Secondary | ICD-10-CM

## 2024-02-24 DIAGNOSIS — I110 Hypertensive heart disease with heart failure: Secondary | ICD-10-CM | POA: Diagnosis not present

## 2024-02-24 DIAGNOSIS — I739 Peripheral vascular disease, unspecified: Secondary | ICD-10-CM | POA: Diagnosis present

## 2024-02-24 DIAGNOSIS — I422 Other hypertrophic cardiomyopathy: Secondary | ICD-10-CM | POA: Diagnosis present

## 2024-02-24 SURGERY — TRANSCAROTID ARTERY REVASCULARIZATION (TCAR)
Anesthesia: General | Laterality: Left

## 2024-02-24 MED ORDER — CHLORHEXIDINE GLUCONATE CLOTH 2 % EX PADS: 6.0000 | MEDICATED_PAD | Freq: Once | CUTANEOUS | Status: DC

## 2024-02-24 MED ORDER — TRAZODONE HCL 50 MG PO TABS
50.0000 mg | ORAL_TABLET | Freq: Every day | ORAL | Status: DC
  Administered 2024-02-24: 50 mg via ORAL
  Filled 2024-02-24: qty 1

## 2024-02-24 MED ORDER — CHLORHEXIDINE GLUCONATE CLOTH 2 % EX PADS
6.0000 | MEDICATED_PAD | Freq: Every day | CUTANEOUS | Status: DC
  Administered 2024-02-25: 6 via TOPICAL

## 2024-02-24 MED ORDER — ATROPINE SULFATE 0.4 MG/ML IV SOLN
INTRAVENOUS | Status: AC
Start: 2024-02-24 — End: 2024-02-24
  Filled 2024-02-24: qty 1

## 2024-02-24 MED ORDER — PROPOFOL 10 MG/ML IV BOLUS
INTRAVENOUS | Status: DC | PRN
  Administered 2024-02-24: 100 mg via INTRAVENOUS

## 2024-02-24 MED ORDER — ACETAMINOPHEN 325 MG PO TABS: 325.0000 mg | ORAL_TABLET | ORAL | Status: DC | PRN

## 2024-02-24 MED ORDER — PHENOL 1.4 % MT LIQD: 1.0000 | OROMUCOSAL | Status: DC | PRN

## 2024-02-24 MED ORDER — LABETALOL HCL 5 MG/ML IV SOLN: 10.0000 mg | INTRAVENOUS | Status: DC | PRN

## 2024-02-24 MED ORDER — HYDRALAZINE HCL 20 MG/ML IJ SOLN: 5.0000 mg | INTRAMUSCULAR | Status: DC | PRN

## 2024-02-24 MED ORDER — LORATADINE 10 MG PO TABS
10.0000 mg | ORAL_TABLET | Freq: Every day | ORAL | Status: DC
  Administered 2024-02-25: 10 mg via ORAL
  Filled 2024-02-24: qty 1

## 2024-02-24 MED ORDER — PHENYLEPHRINE 80 MCG/ML (10ML) SYRINGE FOR IV PUSH (FOR BLOOD PRESSURE SUPPORT)
PREFILLED_SYRINGE | INTRAVENOUS | Status: AC
  Filled 2024-02-24: qty 10

## 2024-02-24 MED ORDER — PHENYLEPHRINE HCL-NACL 20-0.9 MG/250ML-% IV SOLN
INTRAVENOUS | Status: DC | PRN
  Administered 2024-02-24: 40 ug/min via INTRAVENOUS

## 2024-02-24 MED ORDER — PANTOPRAZOLE SODIUM 40 MG PO TBEC
40.0000 mg | DELAYED_RELEASE_TABLET | Freq: Two times a day (BID) | ORAL | Status: DC
  Administered 2024-02-24 – 2024-02-25 (×2): 40 mg via ORAL
  Filled 2024-02-24 (×2): qty 1

## 2024-02-24 MED ORDER — POTASSIUM CHLORIDE CRYS ER 20 MEQ PO TBCR: 40.0000 meq | EXTENDED_RELEASE_TABLET | Freq: Every day | ORAL | Status: DC | PRN

## 2024-02-24 MED ORDER — ONDANSETRON HCL 4 MG/2ML IJ SOLN
4.0000 mg | Freq: Once | INTRAMUSCULAR | Status: DC | PRN
Start: 2024-02-24 — End: 2024-02-24

## 2024-02-24 MED ORDER — LACTATED RINGERS IV SOLN: INTRAVENOUS | Status: DC

## 2024-02-24 MED ORDER — HEPARIN SODIUM (PORCINE) 1000 UNIT/ML IJ SOLN
INTRAMUSCULAR | Status: AC
  Filled 2024-02-24: qty 10

## 2024-02-24 MED ORDER — PROPOFOL 10 MG/ML IV BOLUS
INTRAVENOUS | Status: AC
Start: 2024-02-24 — End: 2024-02-24
  Filled 2024-02-24: qty 20

## 2024-02-24 MED ORDER — ONDANSETRON HCL 4 MG/2ML IJ SOLN
INTRAMUSCULAR | Status: DC | PRN
  Administered 2024-02-24: 4 mg via INTRAVENOUS

## 2024-02-24 MED ORDER — POLYETHYLENE GLYCOL 3350 17 G PO PACK: 17.0000 g | PACK | Freq: Every day | ORAL | Status: DC | PRN

## 2024-02-24 MED ORDER — LIDOCAINE 2% (20 MG/ML) 5 ML SYRINGE
INTRAMUSCULAR | Status: DC | PRN
  Administered 2024-02-24: 100 mg via INTRAVENOUS

## 2024-02-24 MED ORDER — ACETAMINOPHEN 650 MG RE SUPP: 325.0000 mg | RECTAL | Status: DC | PRN

## 2024-02-24 MED ORDER — CEFAZOLIN SODIUM-DEXTROSE 2-4 GM/100ML-% IV SOLN
2.0000 g | Freq: Three times a day (TID) | INTRAVENOUS | Status: AC
  Administered 2024-02-24 – 2024-02-25 (×2): 2 g via INTRAVENOUS
  Filled 2024-02-24 (×2): qty 100

## 2024-02-24 MED ORDER — FENTANYL CITRATE (PF) 100 MCG/2ML IJ SOLN: 25.0000 ug | INTRAMUSCULAR | Status: DC | PRN

## 2024-02-24 MED ORDER — MUPIROCIN 2 % EX OINT
1.0000 | TOPICAL_OINTMENT | Freq: Two times a day (BID) | CUTANEOUS | Status: DC
Start: 2024-02-24 — End: 2024-02-29
  Administered 2024-02-24 – 2024-02-25 (×2): 1 via NASAL
  Filled 2024-02-24: qty 22

## 2024-02-24 MED ORDER — ROCURONIUM BROMIDE 10 MG/ML (PF) SYRINGE
PREFILLED_SYRINGE | INTRAVENOUS | Status: AC
  Filled 2024-02-24: qty 10

## 2024-02-24 MED ORDER — METOCLOPRAMIDE HCL 5 MG PO TABS: 10.0000 mg | ORAL_TABLET | Freq: Four times a day (QID) | ORAL | Status: DC

## 2024-02-24 MED ORDER — LIDOCAINE 2% (20 MG/ML) 5 ML SYRINGE
INTRAMUSCULAR | Status: AC
  Filled 2024-02-24: qty 5

## 2024-02-24 MED ORDER — ROCURONIUM BROMIDE 10 MG/ML (PF) SYRINGE
PREFILLED_SYRINGE | INTRAVENOUS | Status: DC | PRN
  Administered 2024-02-24: 10 mg via INTRAVENOUS
  Administered 2024-02-24: 50 mg via INTRAVENOUS

## 2024-02-24 MED ORDER — 0.9 % SODIUM CHLORIDE (POUR BTL) OPTIME
TOPICAL | Status: DC | PRN
  Administered 2024-02-24: 1000 mL

## 2024-02-24 MED ORDER — METOPROLOL TARTRATE 5 MG/5ML IV SOLN: 2.5000 mg | INTRAVENOUS | Status: DC | PRN

## 2024-02-24 MED ORDER — NITROGLYCERIN 0.4 MG SL SUBL: 0.4000 mg | SUBLINGUAL_TABLET | SUBLINGUAL | Status: DC | PRN

## 2024-02-24 MED ORDER — CHLORHEXIDINE GLUCONATE 0.12 % MT SOLN
15.0000 mL | Freq: Once | OROMUCOSAL | Status: AC
  Administered 2024-02-24: 15 mL via OROMUCOSAL
  Filled 2024-02-24: qty 15

## 2024-02-24 MED ORDER — EPHEDRINE 5 MG/ML INJ
INTRAVENOUS | Status: AC
  Filled 2024-02-24: qty 5

## 2024-02-24 MED ORDER — SODIUM CHLORIDE 0.9 % IV SOLN: INTRAVENOUS | Status: DC

## 2024-02-24 MED ORDER — CLEVIDIPINE BUTYRATE 0.5 MG/ML IV EMUL
INTRAVENOUS | Status: DC | PRN
  Administered 2024-02-24: 2 mg/h via INTRAVENOUS

## 2024-02-24 MED ORDER — ONDANSETRON HCL 4 MG/2ML IJ SOLN: 4.0000 mg | Freq: Four times a day (QID) | INTRAMUSCULAR | Status: DC | PRN

## 2024-02-24 MED ORDER — GLYCOPYRROLATE 0.2 MG/ML IJ SOLN
INTRAMUSCULAR | Status: DC | PRN
  Administered 2024-02-24: .2 mg via INTRAVENOUS

## 2024-02-24 MED ORDER — ROSUVASTATIN CALCIUM 20 MG PO TABS
40.0000 mg | ORAL_TABLET | Freq: Every day | ORAL | Status: DC
  Administered 2024-02-24: 40 mg via ORAL
  Filled 2024-02-24: qty 2

## 2024-02-24 MED ORDER — DEXAMETHASONE SODIUM PHOSPHATE 10 MG/ML IJ SOLN
INTRAMUSCULAR | Status: DC | PRN
  Administered 2024-02-24: 5 mg via INTRAVENOUS

## 2024-02-24 MED ORDER — METOPROLOL SUCCINATE ER 25 MG PO TB24
12.5000 mg | ORAL_TABLET | Freq: Every day | ORAL | Status: DC
  Administered 2024-02-25: 12.5 mg via ORAL
  Filled 2024-02-24: qty 1

## 2024-02-24 MED ORDER — DEXAMETHASONE SODIUM PHOSPHATE 10 MG/ML IJ SOLN
INTRAMUSCULAR | Status: AC
  Filled 2024-02-24: qty 1

## 2024-02-24 MED ORDER — EPHEDRINE SULFATE-NACL 50-0.9 MG/10ML-% IV SOSY
PREFILLED_SYRINGE | INTRAVENOUS | Status: DC | PRN
  Administered 2024-02-24 (×4): 5 mg via INTRAVENOUS

## 2024-02-24 MED ORDER — OXYCODONE-ACETAMINOPHEN 5-325 MG PO TABS: 1.0000 | ORAL_TABLET | ORAL | Status: DC | PRN

## 2024-02-24 MED ORDER — ONDANSETRON HCL 4 MG/2ML IJ SOLN
INTRAMUSCULAR | Status: AC
  Filled 2024-02-24: qty 2

## 2024-02-24 MED ORDER — ACETAMINOPHEN 500 MG PO TABS
1000.0000 mg | ORAL_TABLET | Freq: Once | ORAL | Status: AC
  Administered 2024-02-24: 1000 mg via ORAL
  Filled 2024-02-24: qty 2

## 2024-02-24 MED ORDER — HEPARIN SODIUM (PORCINE) 1000 UNIT/ML IJ SOLN
INTRAMUSCULAR | Status: DC | PRN
  Administered 2024-02-24: 9500 [IU] via INTRAVENOUS

## 2024-02-24 MED ORDER — LIDOCAINE HCL (PF) 1 % IJ SOLN
INTRAMUSCULAR | Status: AC
  Filled 2024-02-24: qty 30

## 2024-02-24 MED ORDER — ASPIRIN 81 MG PO TBEC
81.0000 mg | DELAYED_RELEASE_TABLET | Freq: Every day | ORAL | Status: DC
  Administered 2024-02-25: 81 mg via ORAL
  Filled 2024-02-24: qty 1

## 2024-02-24 MED ORDER — PROTAMINE SULFATE 10 MG/ML IV SOLN
INTRAVENOUS | Status: AC
  Filled 2024-02-24: qty 5

## 2024-02-24 MED ORDER — SUGAMMADEX SODIUM 200 MG/2ML IV SOLN
INTRAVENOUS | Status: DC | PRN
  Administered 2024-02-24: 200 mg via INTRAVENOUS

## 2024-02-24 MED ORDER — FENTANYL CITRATE (PF) 250 MCG/5ML IJ SOLN
INTRAMUSCULAR | Status: DC | PRN
Start: 2024-02-24 — End: 2024-02-24
  Administered 2024-02-24 (×3): 50 ug via INTRAVENOUS

## 2024-02-24 MED ORDER — MIRTAZAPINE 15 MG PO TABS
15.0000 mg | ORAL_TABLET | Freq: Every day | ORAL | Status: DC
  Administered 2024-02-24: 15 mg via ORAL
  Filled 2024-02-24: qty 1

## 2024-02-24 MED ORDER — PROTAMINE SULFATE 10 MG/ML IV SOLN
INTRAVENOUS | Status: DC | PRN
Start: 2024-02-24 — End: 2024-02-24
  Administered 2024-02-24: 50 mg via INTRAVENOUS

## 2024-02-24 MED ORDER — HEPARIN 6000 UNIT IRRIGATION SOLUTION
Status: AC
  Filled 2024-02-24: qty 500

## 2024-02-24 MED ORDER — LACTATED RINGERS IV SOLN
INTRAVENOUS | Status: DC | PRN
Start: 2024-02-24 — End: 2024-02-24

## 2024-02-24 MED ORDER — HYDROMORPHONE HCL 1 MG/ML IJ SOLN: 0.5000 mg | INTRAMUSCULAR | Status: DC | PRN

## 2024-02-24 MED ORDER — CLOPIDOGREL BISULFATE 75 MG PO TABS
75.0000 mg | ORAL_TABLET | Freq: Every day | ORAL | Status: DC
  Administered 2024-02-25: 75 mg via ORAL
  Filled 2024-02-24: qty 1

## 2024-02-24 MED ORDER — CEFAZOLIN SODIUM-DEXTROSE 2-4 GM/100ML-% IV SOLN
2.0000 g | INTRAVENOUS | Status: AC
  Administered 2024-02-24: 2 g via INTRAVENOUS
  Filled 2024-02-24: qty 100

## 2024-02-24 MED ORDER — IODIXANOL 320 MG/ML IV SOLN
INTRAVENOUS | Status: DC | PRN
  Administered 2024-02-24: 35 mL via INTRA_ARTERIAL

## 2024-02-24 MED ORDER — SODIUM CHLORIDE 0.9 % IV SOLN: 500.0000 mL | Freq: Once | INTRAVENOUS | Status: DC | PRN

## 2024-02-24 MED ORDER — ORAL CARE MOUTH RINSE: 15.0000 mL | Freq: Once | OROMUCOSAL | Status: AC

## 2024-02-24 MED ORDER — DEXTROSE 5 % IV SOLN
INTRAVENOUS | Status: AC
Start: 2024-02-24 — End: 2024-02-25

## 2024-02-24 MED ORDER — HEPARIN 6000 UNIT IRRIGATION SOLUTION
Status: DC | PRN
Start: 2024-02-24 — End: 2024-02-24
  Administered 2024-02-24: 1

## 2024-02-24 MED ORDER — DOCUSATE SODIUM 100 MG PO CAPS
100.0000 mg | ORAL_CAPSULE | Freq: Every day | ORAL | Status: DC
  Administered 2024-02-25: 100 mg via ORAL
  Filled 2024-02-24: qty 1

## 2024-02-24 MED ORDER — PHENYLEPHRINE 80 MCG/ML (10ML) SYRINGE FOR IV PUSH (FOR BLOOD PRESSURE SUPPORT)
PREFILLED_SYRINGE | INTRAVENOUS | Status: DC | PRN
Start: 2024-02-24 — End: 2024-02-24
  Administered 2024-02-24 (×2): 80 ug via INTRAVENOUS

## 2024-02-24 MED ORDER — FENTANYL CITRATE (PF) 250 MCG/5ML IJ SOLN
INTRAMUSCULAR | Status: AC
  Filled 2024-02-24: qty 5

## 2024-02-24 SURGICAL SUPPLY — 40 items
BAG BANDED W/RUBBER/TAPE 36X54 (MISCELLANEOUS) ×1 IMPLANT
BAG COUNTER SPONGE SURGICOUNT (BAG) ×1 IMPLANT
CANISTER SUCTION 3000ML PPV (SUCTIONS) ×1 IMPLANT
CATH BALLN ENROUTE 5X35 (CATHETERS) IMPLANT
CATH SUCT 10FR WHISTLE TIP (CATHETERS) ×1 IMPLANT
CLIP TI MEDIUM 6 (CLIP) ×1 IMPLANT
CLIP TI WIDE RED SMALL 6 (CLIP) ×1 IMPLANT
COVER DOME SNAP 22 D (MISCELLANEOUS) ×1 IMPLANT
COVER PROBE W GEL 5X96 (DRAPES) ×1 IMPLANT
DERMABOND ADVANCED .7 DNX12 (GAUZE/BANDAGES/DRESSINGS) ×1 IMPLANT
DRAPE FEMORAL ANGIO 80X135IN (DRAPES) ×1 IMPLANT
ELECTRODE REM PT RTRN 9FT ADLT (ELECTROSURGICAL) ×1 IMPLANT
GLOVE SURG SS PI 7.5 STRL IVOR (GLOVE) ×3 IMPLANT
GOWN STRL REUS W/ TWL LRG LVL3 (GOWN DISPOSABLE) ×2 IMPLANT
GOWN STRL REUS W/ TWL XL LVL3 (GOWN DISPOSABLE) ×1 IMPLANT
GUIDEWIRE ENROUTE 0.014 (WIRE) ×1 IMPLANT
HEMOSTAT SNOW SURGICEL 2X4 (HEMOSTASIS) IMPLANT
KIT BASIN OR (CUSTOM PROCEDURE TRAY) ×1 IMPLANT
KIT ENCORE 26 ADVANTAGE (KITS) ×1 IMPLANT
KIT INTRODUCER GALT 7 (INTRODUCER) ×1 IMPLANT
KIT TURNOVER KIT B (KITS) ×1 IMPLANT
NDL HYPO 25GX1X1/2 BEV (NEEDLE) IMPLANT
NEEDLE HYPO 25GX1X1/2 BEV (NEEDLE) IMPLANT
PACK CAROTID (CUSTOM PROCEDURE TRAY) ×1 IMPLANT
POSITIONER HEAD DONUT 9IN (MISCELLANEOUS) ×1 IMPLANT
SET MICROPUNCTURE 5F STIFF (MISCELLANEOUS) ×1 IMPLANT
STENT TRANSCAROTID 8-6X40 (Permanent Stent) IMPLANT
SURGIFLO W/THROMBIN 8M KIT (HEMOSTASIS) IMPLANT
SUT PROLENE 5 0 C 1 24 (SUTURE) ×2 IMPLANT
SUT SILK 2 0 PERMA HAND 18 BK (SUTURE) ×1 IMPLANT
SUT SILK 2 0 SH (SUTURE) ×1 IMPLANT
SUT VIC AB 3-0 SH 27X BRD (SUTURE) ×2 IMPLANT
SUT VIC AB 4-0 PS2 27 (SUTURE) ×1 IMPLANT
SYR 10ML LL (SYRINGE) ×3 IMPLANT
SYR 20ML LL LF (SYRINGE) ×1 IMPLANT
SYR CONTROL 10ML LL (SYRINGE) IMPLANT
SYSTEM TRANSCAROTID NEUROPRTCT (MISCELLANEOUS) ×1 IMPLANT
TOWEL GREEN STERILE (TOWEL DISPOSABLE) ×1 IMPLANT
WATER STERILE IRR 1000ML POUR (IV SOLUTION) ×1 IMPLANT
WIRE BENTSON .035X145CM (WIRE) ×1 IMPLANT

## 2024-02-24 NOTE — Progress Notes (Signed)
  Progress Note    02/24/2024 1:46 PM * Day of Surgery *  Subjective:  no complaints   Vitals:   02/24/24 1115 02/24/24 1138  BP: (!) 127/59 (!) 124/59  Pulse: 66 69  Resp: 15 12  Temp:  (!) 97.5 F (36.4 C)  SpO2: 95% 98%   Physical Exam: Lungs:  non labored Incisions:  L neck incision c/d/I without hematoma; R groin vein access site without hematoma Extremities:  moving all ext well Neurologic: CN grossly intact  CBC    Component Value Date/Time   WBC 6.5 02/17/2024 1316   RBC 3.98 (L) 02/17/2024 1316   HGB 11.5 (L) 02/17/2024 1316   HGB 10.6 (L) 12/11/2020 1231   HCT 37.7 (L) 02/17/2024 1316   HCT 33.7 (L) 12/11/2020 1231   PLT 259 02/17/2024 1316   PLT 520 (H) 12/11/2020 1231   MCV 94.7 02/17/2024 1316   MCV 91 12/11/2020 1231   MCH 28.9 02/17/2024 1316   MCHC 30.5 02/17/2024 1316   RDW 14.2 02/17/2024 1316   RDW 13.0 12/11/2020 1231   LYMPHSABS 1.4 12/05/2020 0300   MONOABS 0.9 12/05/2020 0300   EOSABS 0.3 12/05/2020 0300   BASOSABS 0.1 12/05/2020 0300    BMET    Component Value Date/Time   NA 141 02/17/2024 1316   NA 139 12/11/2020 1231   K 4.0 02/17/2024 1316   CL 107 02/17/2024 1316   CO2 26 02/17/2024 1316   GLUCOSE 115 (H) 02/17/2024 1316   BUN 15 02/17/2024 1316   BUN 18 12/11/2020 1231   CREATININE 1.20 02/17/2024 1316   CALCIUM  9.3 02/17/2024 1316   GFRNONAA >60 02/17/2024 1316    INR    Component Value Date/Time   INR 1.1 12/05/2020 0300     Intake/Output Summary (Last 24 hours) at 02/24/2024 1346 Last data filed at 02/24/2024 0948 Gross per 24 hour  Intake 1400 ml  Output 20 ml  Net 1380 ml     Assessment/Plan:  78 y.o. male is s/p L TCAR  * Day of Surgery *   No events post op; neuro exam is at baseline L neck incision and R groin venous access site are well appearing Continue a line to help maintain SBP >100 and <160 Home tomorrow if no events overnight   Donnice Sender, PA-C [facility] [phone] 02/24/2024 1:46 PM

## 2024-02-24 NOTE — Plan of Care (Signed)
  Problem: Clinical Measurements: Goal: Ability to maintain clinical measurements within normal limits will improve Outcome: Progressing Goal: Will remain free from infection Outcome: Progressing Goal: Diagnostic test results will improve Outcome: Progressing Goal: Respiratory complications will improve Outcome: Progressing Goal: Cardiovascular complication will be avoided Outcome: Progressing   Problem: Clinical Measurements: Goal: Will remain free from infection Outcome: Progressing   Problem: Elimination: Goal: Will not experience complications related to bowel motility Outcome: Progressing Goal: Will not experience complications related to urinary retention Outcome: Progressing   Problem: Coping: Goal: Level of anxiety will decrease Outcome: Progressing   Problem: Skin Integrity: Goal: Risk for impaired skin integrity will decrease Outcome: Progressing   Problem: Safety: Goal: Ability to remain free from injury will improve Outcome: Progressing   Problem: Pain Managment: Goal: General experience of comfort will improve and/or be controlled Outcome: Progressing   Problem: Skin Integrity: Goal: Demonstration of wound healing without infection will improve Outcome: Progressing

## 2024-02-24 NOTE — Anesthesia Procedure Notes (Signed)
 Procedure Name: Intubation Date/Time: 02/24/2024 8:09 AM  Performed by: Atanacio Arland HERO, CRNAPre-anesthesia Checklist: Patient identified, Emergency Drugs available, Suction available and Patient being monitored Patient Re-evaluated:Patient Re-evaluated prior to induction Oxygen Delivery Method: Circle System Utilized Preoxygenation: Pre-oxygenation with 100% oxygen Induction Type: IV induction Ventilation: Mask ventilation without difficulty Laryngoscope Size: Mac and 4 Grade View: Grade I Tube type: Oral Tube size: 7.5 mm Number of attempts: 1 Airway Equipment and Method: Stylet Placement Confirmation: ETT inserted through vocal cords under direct vision, positive ETCO2 and breath sounds checked- equal and bilateral Secured at: 22 cm Tube secured with: Tape Dental Injury: Teeth and Oropharynx as per pre-operative assessment

## 2024-02-24 NOTE — Progress Notes (Signed)
 Pt received from PACU, V/S obtained, CCMD notified, left neck and right groin vascular sites are level 0, NIH is 0., all needs met, call bell in reach.    02/24/24 1138  Vitals  Temp (!) 97.5 F (36.4 C)  Temp Source Oral  BP (!) 124/59  MAP (mmHg) 79  BP Location Left Arm  BP Method Automatic  Patient Position (if appropriate) Lying  Pulse Rate 69  Pulse Rate Source Monitor  ECG Heart Rate 72  Resp 12  Level of Consciousness  Level of Consciousness Alert  MEWS COLOR  MEWS Score Color Green  Oxygen Therapy  SpO2 98 %  O2 Device Room Air  Art Line  Arterial Line BP 152/58  Arterial Line MAP (mmHg) 92 mmHg  Pain Assessment  Pain Scale 0-10  Pain Score 0  MEWS Score  MEWS Temp 0  MEWS Systolic 0  MEWS Pulse 0  MEWS RR 1  MEWS LOC 0  MEWS Score 1

## 2024-02-24 NOTE — Interval H&P Note (Signed)
 History and Physical Interval Note:  02/24/2024 7:21 AM  Martin [name]  has presented today for surgery, with the diagnosis of BILATERAL CAROTID STENOSIS.  The various methods of treatment have been discussed with the patient and family. After consideration of risks, benefits and other options for treatment, the patient has consented to  Procedure(s): TRANSCAROTID ARTERY REVASCULARIZATION (TCAR) (Left) as a surgical intervention.  The patient's history has been reviewed, patient examined, no change in status, stable for surgery.  I have reviewed the patient's chart and labs.  Questions were answered to the patient's satisfaction.     Malvina New

## 2024-02-24 NOTE — Op Note (Signed)
 Patient name: Martin [name] [name]: [id number] DOB: 1946-01-29 Sex: male  02/24/2024 Pre-operative Diagnosis:?  Symptomatic left carotid stenosis Post-operative diagnosis:  Same Surgeon:  Malvina New Assistants:  Adina Sender, PA Procedure:   Left carotid stent (TCAR)   #2: Ultrasound-guided access, right common femoral vein   #3: Retrograde flow reversal neuroprotection Anesthesia:  general Blood Loss:  minimal Specimens:  none  Findings:  80% stenosis  Stent:  ENROUTE 8x6x40 Predilation balloon: 5 x 35 Contrast: 35 cc Flow reversal time: 7 minutes Fluoroscopy time: 1.8 minutes Procedure time: 45 minutes Dose area: 14.03  Indications: This is a 78 year old gentleman with bilateral high-grade carotid lesions with history of TIA/stroke following CABG.  He comes in today for repair.  Procedure:  The patient was identified in the holding area and taken to [facility] OR ROOM 16  The patient was then placed supine on the table. general anesthesia was administered.  The patient was prepped and draped in the usual sterile fashion.  A time out was called and antibiotics were administered.  A PA was necessary to explain the procedure and assist with technical details.  He help with access in the right femoral vein, wire exchanges and closure.  Ultrasound was used to evaluate the right common femoral vein which was widely patent and easily compressible.  It was cannulated under ultrasound guidance with a micropuncture needle.  An 018 wire was inserted followed by placement of a micropuncture sheath.  Next a Bentson wire was placed in the TCAR sheath was inserted.  A transverse incision was made above the clavicle on the left side between the 2 heads of the sternocleidomastoid.  Cautery was used divide the subcutaneous tissue and platysma muscle.  Identified the 2 heads of the sternocleidomastoid and developed an avascular plane through which I identified and exposed the common carotid artery which was  disease-free.  It was fully mobilized and circumferentially dissected out.  An umbilical tape and Vesseloops were placed.  The patient was fully heparinized.  A 5-0 Prolene U-stitch was placed in the adventitia of the left common carotid artery.  The artery was then cannulated with a micropuncture needle and an 018 wire was inserted up to the mark followed by placement of a micropuncture sheath which was inserted to 3 cm.  A contrast injection was performed to locate the bifurcation which was marked on the screen.  A carotid Amplatz wire was inserted followed by placement of the TCAR sheath.  This sheath was sutured to the skin with 2 silk.  Flow reversal tubing was connected and passive flow reversal was confirmed with a saline flush.  Next the ACT was found to be greater than 250.  The hemodynamics were appropriate.  The carotid artery was clamped by tightening the proximal vessel loop and active flow reversal was initiated and confirmed with a saline flush.  A contrast injection was then performed identifying the anatomy.  A 014 wire was advanced across the lesion without difficulty.  The lesion was predilated with a 5 x 3.5 balloon with mild hemodynamic response.  I then inserted an 8 x 6 x 40 stent.  We waited 2 minutes and performed completion imaging in multiple views which showed no residual stenosis and no access site complications.  The wire was then removed.  Flow reversal was discontinued.  The blood was returned to the sheath in the right groin.  The TCAR sheath in the carotid was removed and the Prolene was used to close the  arteriotomy site.  Hand-held Doppler found excellent signals in the carotid artery.  Protamine  was given to reverse the heparin .  Surgiflo was placed in the carotid incision.  Once hemostasis was satisfactory the platysma muscle was closed with 3-0 Vicryl and skin was closed with 4-0 Vicryl followed by Dermabond.  The sheath in the right groin was removed and manual pressure was  held for hemostasis.  The patient was successfully extubated taken recovery stable condition.  There are no immediate complications.   Disposition: To PACU stable.   ALONSO Malvina New, M.D., [facility] [place] Office: [phone] Pager:  [phone]

## 2024-02-24 NOTE — Anesthesia Procedure Notes (Signed)
 Arterial Line Insertion Start/End6/25/2025 7:10 AM, 02/24/2024 7:45 AM Performed by: Corinne Garnette BRAVO, MD, Atanacio Arland HERO, CRNA  Patient location: Pre-op. Preanesthetic checklist: patient identified, IV checked, site marked, risks and benefits discussed, surgical consent, monitors and equipment checked, pre-op evaluation, timeout performed and anesthesia consent Lidocaine  1% used for infiltration and patient sedated Right, radial was placed Catheter size: 20 G Hand hygiene performed  and maximum sterile barriers used   Attempts: 4 Procedure performed using ultrasound guided technique. Following insertion, dressing applied. Post procedure assessment: normal and unchanged  Patient tolerated the procedure well with no immediate complications. Additional procedure comments: Attempt x 2 by Atanacio, CRNA unsuccessful. Placed by Corinne, MD with US  guidance.SABRA

## 2024-02-24 NOTE — Transfer of Care (Signed)
 Immediate Anesthesia Transfer of Care Note  Patient: Martin [name]  Procedure(s) Performed: LERETHA ARTERY REVASCULARIZATION (TCAR) (Left)  Patient Location: PACU  Anesthesia Type:General  Level of Consciousness: awake and alert   Airway & Oxygen Therapy: Patient Spontanous Breathing  Post-op Assessment: Report given to RN and Post -op Vital signs reviewed and stable  Post vital signs: Reviewed and stable  Last Vitals:  Vitals Value Taken Time  BP 111/56 02/24/24 09:45  Temp 97.6   Pulse 75 02/24/24 09:47  Resp 15 02/24/24 09:47  SpO2 96 % 02/24/24 09:47  Vitals shown include unfiled device data.  Last Pain:  Vitals:   02/24/24 0626  TempSrc:   PainSc: 0-No pain         Complications: No notable events documented.

## 2024-02-25 ENCOUNTER — Encounter (HOSPITAL_COMMUNITY): Payer: Self-pay | Admitting: Surgery

## 2024-02-25 DIAGNOSIS — Z48812 Encounter for surgical aftercare following surgery on the circulatory system: Secondary | ICD-10-CM

## 2024-02-25 LAB — BASIC METABOLIC PANEL WITH GFR
Anion gap: 4 — ABNORMAL LOW (ref 5–15)
BUN: 20 mg/dL (ref 8–23)
CO2: 29 mmol/L (ref 22–32)
Calcium: 8.9 mg/dL (ref 8.9–10.3)
Chloride: 106 mmol/L (ref 98–111)
Creatinine, Ser: 1.34 mg/dL — ABNORMAL HIGH (ref 0.61–1.24)
GFR, Estimated: 54 mL/min — ABNORMAL LOW (ref >60)
Glucose, Bld: 125 mg/dL — ABNORMAL HIGH (ref 70–99)
Potassium: 4.9 mmol/L (ref 3.5–5.1)
Sodium: 139 mmol/L (ref 135–145)

## 2024-02-25 LAB — CBC
HCT: 33.5 % — ABNORMAL LOW (ref 39.0–52.0)
Hemoglobin: 10.5 g/dL — ABNORMAL LOW (ref 13.0–17.0)
MCH: 29.6 pg (ref 26.0–34.0)
MCHC: 31.3 g/dL (ref 30.0–36.0)
MCV: 94.4 fL (ref 80.0–100.0)
Platelets: 205 10*3/uL (ref 150–400)
RBC: 3.55 MIL/uL — ABNORMAL LOW (ref 4.22–5.81)
RDW: 14.3 % (ref 11.5–15.5)
WBC: 11.8 10*3/uL — ABNORMAL HIGH (ref 4.0–10.5)
nRBC: 0 % (ref 0.0–0.2)

## 2024-02-25 LAB — POCT ACTIVATED CLOTTING TIME: Activated Clotting Time: 250 s

## 2024-02-25 MED ORDER — OXYCODONE-ACETAMINOPHEN 5-325 MG PO TABS: 1.0000 | ORAL_TABLET | Freq: Four times a day (QID) | ORAL | 0 refills | Status: DC | PRN

## 2024-02-25 NOTE — Discharge Instructions (Signed)
   [facility]  Discharge Instructions   Carotid Endarterectomy (CEA)  Please refer to the following instructions for your post-procedure care. Your surgeon or physician assistant will discuss any changes with you.  Activity  You are encouraged to walk as much as you can. You can slowly return to normal activities but must avoid strenuous activity and heavy lifting until your doctor tell you it's OK. Avoid activities such as vacuuming or swinging a golf club. You can drive after one week if you are comfortable and you are no longer taking prescription pain medications. It is normal to feel tired for serval weeks after your surgery. It is also normal to have difficulty with sleep habits, eating, and bowel movements after surgery. These will go away with time.  Bathing/Showering  You may shower after you come home. Do not soak in a bathtub, hot tub, or swim until the incision heals completely.  Incision Care  Shower every day. Clean your incision with mild soap and water. Pat the area dry with a clean towel. You do not need a bandage unless otherwise instructed. Do not apply any ointments or creams to your incision. You may have skin glue on your incision. Do not peel it off. It will come off on its own in about one week. Your incision may feel thickened and raised for several weeks after your surgery. This is normal and the skin will soften over time. For Men Only: It's OK to shave around the incision but do not shave the incision itself for 2 weeks. It is common to have numbness under your chin that could last for several months.  Diet  Resume your normal diet. There are no special food restrictions following this procedure. A low fat/low cholesterol diet is recommended for all patients with vascular disease. In order to heal from your surgery, it is CRITICAL to get adequate nutrition. Your body requires vitamins, minerals, and protein. Vegetables are the best  source of vitamins and minerals. Vegetables also provide the perfect balance of protein. Processed food has little nutritional value, so try to avoid this.        Medications  Resume taking all of your medications unless your doctor or physician assistant tells you not to. If your incision is causing pain, you may take over-the- counter pain relievers such as acetaminophen (Tylenol). If you were prescribed a stronger pain medication, please be aware these medications can cause nausea and constipation. Prevent nausea by taking the medication with a snack or meal. Avoid constipation by drinking plenty of fluids and eating foods with a high amount of fiber, such as fruits, vegetables, and grains. Do not take Tylenol if you are taking prescription pain medications.  Follow Up  Our office will schedule a follow up appointment 2-3 weeks following discharge.  Please call us immediately for any of the following conditions  Increased pain, redness, drainage (pus) from your incision site. Fever of 101 degrees or higher. If you should develop stroke (slurred speech, difficulty swallowing, weakness on one side of your body, loss of vision) you should call 911 and go to the nearest emergency room.  Reduce your risk of vascular disease:  Stop smoking. If you would like help call [facility] at 1-800-QUIT-NOW ((910) 853-8047) or [place] at [phone]. Manage your cholesterol Maintain a desired weight Control your diabetes Keep your blood pressure down  If you have any questions, please call the office at [phone].

## 2024-02-25 NOTE — Progress Notes (Signed)
   02/25/24 1023  TOC Brief Assessment  Insurance and Status Reviewed  Patient has primary care physician Yes  Home environment has been reviewed home w/ spouse  Prior level of function: self  Prior/Current Home Services No current home services  Social Drivers of Health Review SDOH reviewed no interventions necessary  Readmission risk has been reviewed Yes  Transition of care needs no transition of care needs at this time    Pt stable for transition home today s/p TCAR.  No HH or DME needs noted.

## 2024-02-25 NOTE — Progress Notes (Signed)
    Subjective  - POD # 1, status post left TCAR  Patient is ambulated in the hallways this morning.  He tolerated dinner last night.  He is not complaining of any pain.   Physical Exam:  Right groin is soft without hematoma Left neck incision is soft without hematoma Neurologically intact       Assessment/Plan:  POD # 1  Anticipate discharge home today Patient will be discussed for right sided TCAR in 4 to 6 weeks  Wells Xeng Kucher 02/25/2024 7:46 AM --  Vitals:   02/25/24 0029 02/25/24 0351  BP: (!) 102/48 (!) 106/43  Pulse: 60 61  Resp: 15 16  Temp:  97.8 F (36.6 C)  SpO2:  98%    Intake/Output Summary (Last 24 hours) at 02/25/2024 0746 Last data filed at 02/25/2024 0600 Gross per 24 hour  Intake 1820 ml  Output 470 ml  Net 1350 ml     Laboratory CBC    Component Value Date/Time   WBC 11.8 (H) 02/25/2024 0353   HGB 10.5 (L) 02/25/2024 0353   HGB 10.6 (L) 12/11/2020 1231   HCT 33.5 (L) 02/25/2024 0353   HCT 33.7 (L) 12/11/2020 1231   PLT 205 02/25/2024 0353   PLT 520 (H) 12/11/2020 1231    BMET    Component Value Date/Time   NA 139 02/25/2024 0353   NA 139 12/11/2020 1231   K 4.9 02/25/2024 0353   CL 106 02/25/2024 0353   CO2 29 02/25/2024 0353   GLUCOSE 125 (H) 02/25/2024 0353   BUN 20 02/25/2024 0353   BUN 18 12/11/2020 1231   CREATININE 1.34 (H) 02/25/2024 0353   CALCIUM  8.9 02/25/2024 0353   GFRNONAA 54 (L) 02/25/2024 0353    COAG Lab Results  Component Value Date   INR 1.1 12/05/2020   INR 1.3 (H) 11/21/2020   INR 1.1 11/21/2020   No results found for: PTT  Antibiotics Anti-infectives (From admission, onward)    Start     Dose/Rate Route Frequency Ordered Stop   02/24/24 1630  ceFAZolin  (ANCEF ) IVPB 2g/100 mL premix        2 g 200 mL/hr over 30 Minutes Intravenous Every 8 hours 02/24/24 1143 02/25/24 0052   02/24/24 0542  ceFAZolin  (ANCEF ) IVPB 2g/100 mL premix        2 g 200 mL/hr over 30 Minutes Intravenous 30 min  pre-op 02/24/24 0542 02/24/24 0848        V. Malvina Serene CLORE, M.D., [facility] [place] Office: [phone] Pager:  [phone]

## 2024-02-25 NOTE — Anesthesia Postprocedure Evaluation (Signed)
 Anesthesia Post Note  Patient: NICHOLI GHUMAN  Procedure(s) Performed: LERETHA ARTERY REVASCULARIZATION (TCAR) (Left)     Patient location during evaluation: Nursing Unit Anesthesia Type: General Level of consciousness: awake and alert Pain management: pain level controlled Vital Signs Assessment: post-procedure vital signs reviewed and stable Respiratory status: spontaneous breathing, nonlabored ventilation and respiratory function stable Cardiovascular status: blood pressure returned to baseline and stable Postop Assessment: no apparent nausea or vomiting Anesthetic complications: no   No notable events documented.  Last Vitals:  Vitals:   02/25/24 0351 02/25/24 0938  BP: (!) 106/43 (!) 118/50  Pulse: 61 (!) 50  Resp: 16 13  Temp: 36.6 C 36.6 C  SpO2: 98% 97%    Last Pain:  Vitals:   02/25/24 0938  TempSrc: Oral  PainSc: 0-No pain                 Garnette FORBES [place]

## 2024-02-25 NOTE — Progress Notes (Signed)
 Discharge instructions (including medications) discussed with and copy provided to patient/caregiver

## 2024-02-25 NOTE — Discharge Summary (Signed)
 Discharge Summary     Martin [name] 04-May-1946 78 y.o. male  [id number]  Admission Date: 02/24/2024  Discharge Date: 02/25/24  Physician: Serene Gaile ORN, MD  Admission Diagnosis: Carotid occlusion, bilateral [I65.23] S/P carotid endarterectomy [Z98.890] Carotid artery stenosis [I65.29]  Discharge Day services:   See progress note 02/25/2024  Hospital Course:  Martin [name] is a 78 year old male who was brought in as an outpatient and underwent left-sided TCAR by Dr. Serene on 02/24/2024.  He tolerated the procedure well and was admitted to the hospital postoperatively.  His neuroexam remained at baseline and he did not experience any strokelike symptoms postoperatively.  His left neck incision is well-appearing without hematoma.  He is ready for discharge home on postoperative day #1.  He will continue his aspirin , Plavix , statin daily.  He will follow-up in the office with a carotid duplex in 1 month.  At that time he will likely be scheduled for right sided TCAR by Dr. Serene due to high-grade asymptomatic stenosis of the right ICA.  He was discharged home in stable condition.   Recent Labs    02/25/24 0353  NA 139  K 4.9  CL 106  CO2 29  GLUCOSE 125*  BUN 20  CALCIUM  8.9   Recent Labs    02/25/24 0353  WBC 11.8*  HGB 10.5*  HCT 33.5*  PLT 205   No results for input(s): INR in the last 72 hours.     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   Atypical chest pain 07/24/2023   Hiatal hernia 07/24/2023   Abdominal pain, epigastric 07/24/2023    Nausea and vomiting 07/24/2023   Elevated troponin 07/24/2023   Cholelithiasis 07/24/2023   Renal insufficiency 07/24/2023   Normocytic anemia 07/24/2023   BPH (benign prostatic hyperplasia) 07/24/2023   History of ulcerative colitis 07/24/2023   Atrial flutter (HCC) 12/05/2020   Pericardial effusion 12/05/2020   Acute systolic (congestive) heart failure (HCC) 12/05/2020   AKI (acute kidney injury) (HCC) 12/05/2020   Lactic acidosis 12/05/2020   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   CAD (coronary artery disease), native coronary artery 11/20/2020   Abnormal nuclear stress test    Essential hypertension 11/01/2020   Hypertrophic cardiomyopathy (HCC) 12/11/2019   Educated about COVID-19 virus infection 12/11/2019   Dyslipidemia 07/07/2019   Stenosis of left carotid artery 07/07/2019   HLD (hyperlipidemia) 08/01/2009   Coronary artery disease involving native coronary artery of native heart with angina pectoris (HCC) 08/01/2009   GERD 08/01/2009   Ulcerative colitis (HCC) 08/01/2009   PALPITATIONS 07/13/2009   Past Medical History:  Diagnosis Date   Anemia    Iron Deficiency   Anxiety and depression    Benign prostatic hypertrophy    Cancer (HCC) 2020   Prostate Cancer   Coronary artery disease    cath 04/2007 with 90-95% RCA stenosis. Had DES placed.  Had an acute stent thrombosis in the hospital that day and subsequently had 3 DES placed in the RCA. The LAD had 45% stenosis, in the circ had 35% stenosis. The EF was well preserved.   Dyslipidemia    GERD (gastroesophageal reflux disease)    History of hiatal  hernia    Hypercholesterolemia    Insomnia    Myocardial infarction [facility]) 2008   Peripheral vascular disease (HCC)    Carotid Stenosis - Followed by Dr. Serene   Stroke [facility])    TIA   Ulcerative colitis     Allergies as of 02/25/2024       Reactions   Morphine  Other (See Comments)   Hot, sweats, flushed        Medication List     TAKE these medications    aspirin   EC 81 MG tablet Take 81 mg by mouth daily. Swallow whole.   clopidogrel  75 MG tablet Commonly known as: PLAVIX  Take 75 mg by mouth daily.   loratadine  10 MG tablet Commonly known as: CLARITIN  Take 10 mg by mouth daily.   metoCLOPramide  10 MG tablet Commonly known as: REGLAN  Take 1 tablet (10 mg total) by mouth every 6 (six) hours.   metoprolol  succinate 25 MG 24 hr tablet Commonly known as: TOPROL -XL Take 0.5 tablets (12.5 mg total) by mouth daily.   mirtazapine 15 MG tablet Commonly known as: REMERON Take 15 mg by mouth at bedtime.   nitroGLYCERIN  0.4 MG SL tablet Commonly known as: NITROSTAT  Place 1 tablet (0.4 mg total) under the tongue every 5 (five) minutes as needed for chest pain.   oxyCODONE -acetaminophen  5-325 MG tablet Commonly known as: PERCOCET/ROXICET Take 1 tablet by mouth every 6 (six) hours as needed for moderate pain (pain score 4-6).   pantoprazole  40 MG tablet Commonly known as: PROTONIX  Take 1 tablet (40 mg total) by mouth 2 (two) times daily.   rosuvastatin  40 MG tablet Commonly known as: CRESTOR  Take 1 tablet (40 mg total) by mouth daily. What changed: when to take this   traZODone  50 MG tablet Commonly known as: DESYREL  Take 50 mg by mouth at bedtime.         Discharge Instructions:   [facility] Discharge Instructions Carotid Endarterectomy (CEA)  Please refer to the following instructions for your post-procedure care. Your surgeon or physician assistant will discuss any changes with you.  Activity  You are encouraged to walk as much as you can. You can slowly return to normal activities but must avoid strenuous activity and heavy lifting until your doctor tell you it's OK. Avoid activities such as vacuuming or swinging a golf club. You can drive after one week if you are comfortable and you are no longer taking prescription pain medications. It is normal to feel tired for serval weeks after your  surgery. It is also normal to have difficulty with sleep habits, eating, and bowel movements after surgery. These will go away with time.  Bathing/Showering  You may shower after you come home. Do not soak in a bathtub, hot tub, or swim until the incision heals completely.  Incision Care  Shower every day. Clean your incision with mild soap and water . Pat the area dry with a clean towel. You do not need a bandage unless otherwise instructed. Do not apply any ointments or creams to your incision. You may have skin glue on your incision. Do not peel it off. It will come off on its own in about one week. Your incision may feel thickened and raised for several weeks after your surgery. This is normal and the skin will soften over time. For Men Only: It's OK to shave around the incision but do not shave the incision itself for 2 weeks. It is common to have numbness  under your chin that could last for several months.  Diet  Resume your normal diet. There are no special food restrictions following this procedure. A low fat/low cholesterol diet is recommended for all patients with vascular disease. In order to heal from your surgery, it is CRITICAL to get adequate nutrition. Your body requires vitamins, minerals, and protein. Vegetables are the best source of vitamins and minerals. Vegetables also provide the perfect balance of protein. Processed food has little nutritional value, so try to avoid this.  Medications  Resume taking all of your medications unless your doctor or physician assistant tells you not to.  If your incision is causing pain, you may take over-the- counter pain relievers such as acetaminophen  (Tylenol ). If you were prescribed a stronger pain medication, please be aware these medications can cause nausea and constipation.  Prevent nausea by taking the medication with a snack or meal. Avoid constipation by drinking plenty of fluids and eating foods with a high amount of fiber, such as  fruits, vegetables, and grains. Do not take Tylenol  if you are taking prescription pain medications.  Follow Up  Our office will schedule a follow up appointment 2-3 weeks following discharge.  Please call us  immediately for any of the following conditions  Increased pain, redness, drainage (pus) from your incision site. Fever of 101 degrees or higher. If you should develop stroke (slurred speech, difficulty swallowing, weakness on one side of your body, loss of vision) you should call 911 and go to the nearest emergency room.  Reduce your risk of vascular disease:  Stop smoking. If you would like help call [facility] at 1-800-QUIT-NOW (8155224896) or [place] at [phone]. Manage your cholesterol Maintain a desired weight Control your diabetes Keep your blood pressure down  If you have any questions, please call the office at [phone].   Disposition: HOME  Patient's condition: is Fair  Follow up: 1. [facility] in 4 weeks.   Donnice Sender, PA-C [facility] [phone]   --- For [facility] Registry use ---   Modified Rankin score at D/C (0-6): 0  IV medication needed for:  1. Hypertension: No 2. Hypotension: No  Post-op Complications: No  1. Post-op CVA or TIA: No  If yes: Event classification (right eye, left eye, right cortical, left cortical, verterobasilar, other):   If yes: Timing of event (intra-op, <6 hrs post-op, >=6 hrs post-op, unknown):   2. CN injury: No  If yes: CN  injuried   3. Myocardial infarction: No  If yes: Dx by (EKG or clinical, Troponin):   4.  CHF: No  5.  Dysrhythmia (new): No  6. Wound infection: No  7. Reperfusion symptoms: No  8. Return to OR: No  If yes: return to OR for (bleeding, neurologic, other CEA incision, other):   Discharge medications: Statin use:  Yes ASA use:  Yes   Beta blocker use:  Yes ACE-Inhibitor use:  No  ARB use:  No CCB use: No P2Y12 Antagonist use: Yes, [ x] Plavix , [  ] Plasugrel, [ ]  Ticlopinine, [ ]  Ticagrelor, [ ]  Other, [ ]  No for medical reason, [ ]  Non-compliant, [ ]  Not-indicated Anti-coagulant use:  No, [ ]  Warfarin, [ ]  Rivaroxaban, [ ]  Dabigatran,

## 2024-02-26 ENCOUNTER — Telehealth: Payer: Self-pay | Admitting: Cardiology

## 2024-02-26 ENCOUNTER — Other Ambulatory Visit: Payer: Self-pay

## 2024-02-26 ENCOUNTER — Emergency Department (HOSPITAL_COMMUNITY)
Admission: EM | Admit: 2024-02-26 | Discharge: 2024-02-27 | Disposition: A | Discharge status: Home / Self Care | Attending: Student | Admitting: Student

## 2024-02-26 ENCOUNTER — Emergency Department (HOSPITAL_COMMUNITY)

## 2024-02-26 ENCOUNTER — Encounter (HOSPITAL_COMMUNITY): Payer: Self-pay

## 2024-02-26 DIAGNOSIS — R55 Syncope and collapse: Secondary | ICD-10-CM | POA: Diagnosis present

## 2024-02-26 DIAGNOSIS — Z951 Presence of aortocoronary bypass graft: Secondary | ICD-10-CM | POA: Insufficient documentation

## 2024-02-26 DIAGNOSIS — Z7901 Long term (current) use of anticoagulants: Secondary | ICD-10-CM | POA: Insufficient documentation

## 2024-02-26 DIAGNOSIS — E861 Hypovolemia: Secondary | ICD-10-CM | POA: Insufficient documentation

## 2024-02-26 DIAGNOSIS — R001 Bradycardia, unspecified: Secondary | ICD-10-CM | POA: Diagnosis not present

## 2024-02-26 LAB — CBC
HCT: 33.7 % — ABNORMAL LOW (ref 39.0–52.0)
Hemoglobin: 10.6 g/dL — ABNORMAL LOW (ref 13.0–17.0)
MCH: 30.1 pg (ref 26.0–34.0)
MCHC: 31.5 g/dL (ref 30.0–36.0)
MCV: 95.7 fL (ref 80.0–100.0)
Platelets: 221 10*3/uL (ref 150–400)
RBC: 3.52 MIL/uL — ABNORMAL LOW (ref 4.22–5.81)
RDW: 14.9 % (ref 11.5–15.5)
WBC: 9.4 10*3/uL (ref 4.0–10.5)
nRBC: 0 % (ref 0.0–0.2)

## 2024-02-26 LAB — BASIC METABOLIC PANEL WITH GFR
Anion gap: 9 (ref 5–15)
BUN: 23 mg/dL (ref 8–23)
CO2: 22 mmol/L (ref 22–32)
Calcium: 8.8 mg/dL — ABNORMAL LOW (ref 8.9–10.3)
Chloride: 106 mmol/L (ref 98–111)
Creatinine, Ser: 1.25 mg/dL — ABNORMAL HIGH (ref 0.61–1.24)
GFR, Estimated: 59 mL/min — ABNORMAL LOW (ref >60)
Glucose, Bld: 104 mg/dL — ABNORMAL HIGH (ref 70–99)
Potassium: 3.9 mmol/L (ref 3.5–5.1)
Sodium: 137 mmol/L (ref 135–145)

## 2024-02-26 LAB — TROPONIN I (HIGH SENSITIVITY): Troponin I (High Sensitivity): 16 ng/L (ref <18)

## 2024-02-26 LAB — TSH: TSH: 1.252 u[IU]/mL (ref 0.350–4.500)

## 2024-02-26 MED ORDER — LACTATED RINGERS IV BOLUS
1000.0000 mL | Freq: Once | INTRAVENOUS | Status: AC
  Administered 2024-02-26: 1000 mL via INTRAVENOUS

## 2024-02-26 NOTE — Discharge Instructions (Signed)
 Please continue to drink lots of fluids.  Upon standing please pause for 10 seconds before walking.  If you have any near-syncope symptoms please sit back down.  Please call your cardiologist to make a follow-up appointment as soon as possible.  If you have any recurrence of symptoms please return to the ED immediately. Thank you for allowing us  to take care of you today.  We hope you begin feeling better soon.   To-Do:  Please follow-up with your primary doctor within the next 2-3 days. Please return to the Emergency Department or call 911 if you experience chest pain, shortness of breath, severe pain, severe fever, altered mental status, or have any reason to think that you need emergency medical care.  Thank you again.  Hope you feel better soon.  Jolynn Pack Department of Emergency Medicine

## 2024-02-26 NOTE — ED Provider Triage Note (Signed)
 Emergency Medicine Provider Triage Evaluation Note  Martin [name] , a 78 y.o. male  was evaluated in triage.  Pt complains of low heart rate and bradycardia at home.  Patient had carotid endarterectomy 2 days ago.  Has been feeling well at home.  Was checking vital signs this morning and his blood pressure was noted to be low.  The patient was asymptomatic with this.  Reports that he normally has a low heart rate.  Denies any associated chest pain, shortness of breath, or lightheadedness.  He came to the ER for further evaluation regarding this after calling his doctor.  Review of Systems  Positive: Low BP, low HR Negative: Chest pain, lightheadedness  Physical Exam  BP (!) 109/43   Pulse (!) 49   Temp 98.9 F (37.2 C) (Oral)   Resp 16   Ht 6' 1 (1.854 m)   Wt 95.3 kg   SpO2 100%   BMI 27.71 kg/m  Gen:   Awake, no distress   Resp:  Normal effort  MSK:   Moves extremities without difficulty  Other:  Bradycardic  Medical Decision Making  Medically screening exam initiated at 11:46 AM.  Appropriate orders placed.  HANSON MEDEIROS was informed that the remainder of the evaluation will be completed by another provider, this initial triage assessment does not replace that evaluation, and the importance of remaining in the ED until their evaluation is complete.  Will further evaluate the patient here with basic labs as well as thyroid testing here.  His blood pressure here seems stable at his baseline.  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   Ula Prentice SAUNDERS, MD 02/26/24 [phone]

## 2024-02-26 NOTE — ED Provider Notes (Signed)
 Martin [name] EMERGENCY DEPARTMENT AT [facility] Provider Note   CSN: [id number] Arrival date & time: 02/26/24  1106     History Chief Complaint  Patient presents with   Bradycardia    Martin [name] is a 78 y.o. male w/ PMHx TIA/CVA/CABG, s/p right carotid endarterectomy 6/25 discharged yesterday who presents to the ED for evaluation of near syncope.  Patient states he was walking when all of a sudden his vision started to black and he felt dizzy and he lowered himself to the ground.  Patient did not fall or hit his head.  Family took blood pressure at this time noted to be low.  Patient also notes his heart rate has been lower than normal. On Plavix  and ASA       Physical Exam Updated Vital Signs BP 102/77 (BP Location: Right Arm)   Pulse (!) 54   Temp 97.6 F (36.4 C) (Oral)   Resp 17   Ht 6' 1 (1.854 m)   Wt 95.3 kg   SpO2 100%   BMI 27.71 kg/m  Physical Exam Vitals and nursing note reviewed.  Constitutional:      General: He is not in acute distress.    Appearance: He is well-developed.  HENT:     Head: Normocephalic and atraumatic.   Eyes:     Conjunctiva/sclera: Conjunctivae normal.    Cardiovascular:     Rate and Rhythm: Regular rhythm. Bradycardia present.     Pulses: Normal pulses.     Heart sounds: Normal heart sounds. No murmur heard. Pulmonary:     Effort: Pulmonary effort is normal. No respiratory distress.     Breath sounds: Normal breath sounds.  Abdominal:     Palpations: Abdomen is soft.     Tenderness: There is no abdominal tenderness.   Musculoskeletal:     Cervical back: Neck supple.     Right lower leg: No edema.     Left lower leg: No edema.   Skin:    General: Skin is warm and dry.     Capillary Refill: Capillary refill takes less than 2 seconds.   Neurological:     General: No focal deficit present.     Mental Status: He is alert.   Psychiatric:        Mood and Affect: Mood normal.     ED Results / Procedures  / Treatments   Labs (all labs ordered are listed, but only abnormal results are displayed) Labs Reviewed  BASIC METABOLIC PANEL WITH GFR - Abnormal; Notable for the following components:      Result Value   Glucose, Bld 104 (*)    Creatinine, Ser 1.25 (*)    Calcium  8.8 (*)    GFR, Estimated 59 (*)    All other components within normal limits  CBC - Abnormal; Notable for the following components:   RBC 3.52 (*)    Hemoglobin 10.6 (*)    HCT 33.7 (*)    All other components within normal limits  TSH  TROPONIN I (HIGH SENSITIVITY)    EKG EKG Interpretation Date/Time:  Friday February 26 2024 11:19:32 EDT Ventricular Rate:  49 PR Interval:  206 QRS Duration:  100 QT Interval:  478 QTC Calculation: 431 R Axis:   73  Text Interpretation: Sinus bradycardia with occasional Premature ventricular complexes Otherwise normal ECG When compared with ECG of 23-Jul-2023 18:00, PREVIOUS ECG IS PRESENT Confirmed by Ula Barter [phone]) on 02/26/2024 11:37:24 AM  Radiology DG Chest [facility]  1 View Result Date: 02/26/2024 CLINICAL DATA:  Dyspnea, bradycardia EXAM: PORTABLE CHEST 1 VIEW COMPARISON:  None Available. FINDINGS: Lungs are well expanded, symmetric, and clear. No pneumothorax or pleural effusion. Coronary artery bypass grafting has been performed. Cardiac size within normal limits. Pulmonary vascularity is normal. Osseous structures are age-appropriate. No acute bone abnormality. IMPRESSION: No active disease. Electronically Signed   By: Dorethia Molt M.D.   On: 02/26/2024 13:20    Medications Ordered in ED Medications  lactated ringers  bolus 1,000 mL (1,000 mLs Intravenous New Bag/Given 02/26/24 1559)    ED Course/ Medical Decision Making/ A&P  Martin [name] is a 78 y.o. male presents as detailed above  Differential ddx: Orthostatic hypotension, anemia, dehydration, electrolyte abnormalities, arrhythmia, symptomatic bradycardia  On arrival, patient afebrile hemodynamically stable no  hypoxia or respiratory distress.  BP soft.  Significantly variable heart rate during history.  ED Work-up: Please see details of labs and imaging listed above. Hemoglobin at baseline.  Creatinine also at baseline.  No significant electrolyte abnormalities.  No leukocytosis.  No elevation of troponin.  TSH within normal limits.  No significant abnormality on chest x-ray. Initial EKG sinus bradycardia with PVCs. Orthostatics negative after liter fluid bolus.  After liter fluid bolus patient asymptomatic.  Patient ambulated in the ED multiple times without difficulty or recurrence of symptoms.  Recheck heart rate 54.  Symptoms likely related to hypovolemia and hypotension.  Advised patient if he has recurrence of symptoms he should return to the ED.  Also advised patient to follow-up with cardiology on Monday.  Patient and wife voiced understanding current plan.   Overall impression hypotension, near syncope, Patient stable for discharge and outpatient follow-up. Strict return precautions provided. Patient voices understanding and agrees with plan. Patient seen with supervising physician who agrees with plan.  Final Clinical Impression(s) / ED Diagnoses Final diagnoses:  Bradycardia  Near syncope  Hypovolemia    Waddell Seats, DO PGY-3 Emergency Medicine    Seats Waddell, DO 02/26/24 2025    Albertina Dixon, MD 02/27/24 [phone]

## 2024-02-26 NOTE — ED Triage Notes (Signed)
 Pt to ED via [facility] c/o bradycardia. Pt had a Carotid  clean out and left stent placed on 6/25, pt wife concern because took HR at home and was 46, Pt voicing no complaints.    Last VS 110/50, 96%RA, HR 46.   No medications given by EMS.

## 2024-02-26 NOTE — Telephone Encounter (Signed)
 Pt c/o Syncope: STAT if syncope occurred within 24 hours and pt complains of lightheadedness  Did you pass out today?  No    When is the last time you passed out?  Around 6:00-6:30 PM yesterday    Has this occurred multiple times?  Yes, occurred 3-4 times yesterday    Did you have any symptoms prior to passing out? Dizziness + low BP--100/47 46    5. Did you fall? If so, are you on a blood thinner? Yes, Plavix  + Aspirin 

## 2024-02-26 NOTE — ED Notes (Signed)
 Pt ambulated in the hall. Pt able to ambulate independently and has a steady gait.

## 2024-02-26 NOTE — Telephone Encounter (Signed)
 Spoke with Pts wife per DPR. Pt had a TCAR on 6/25. Came home and he has not felt good since. Has been feeling dizzy and passed out yesterday. Wife states he said Everything just went black. BP 100/46 HR 47 Wife stated he feels very dizzy and is having a hard time standing. Advised to call 911 or go to nearest ED. Niels stated understanding.

## 2024-03-15 ENCOUNTER — Other Ambulatory Visit: Payer: Self-pay

## 2024-03-15 DIAGNOSIS — I6523 Occlusion and stenosis of bilateral carotid arteries: Secondary | ICD-10-CM

## 2024-03-23 NOTE — Progress Notes (Signed)
 POST OPERATIVE OFFICE NOTE    CC:  F/u for surgery  HPI:  This is a 78 y.o. male who is s/p left-sided transcarotid artery revascularization due to questionable symptomatic high-grade stenosis by Dr. Serene on 02/24/2024.  He had an uneventful hospital stay and was discharged on postoperative day #1.  He unfortunately presented to the emergency department on postoperative day #2 due to dizziness and near syncope.  He was found to be bradycardic in the emergency department.  His left neck incision has completely healed.  He denies any strokelike symptoms since discharge.  He is on aspirin , Plavix , statin daily.  Preoperative workup included a CTA neck which demonstrated an 80% right ICA stenosis.  Allergies  Allergen Reactions   Flomax  [Tamsulosin ] Other (See Comments)    Hypotension    Ms Contin  [Morphine ] Other (See Comments)    Vasomotor symptoms    Toprol  Xl [Metoprolol ] Other (See Comments)    Hypotension     Current Outpatient Medications  Medication Sig Dispense Refill   acetaminophen  (TYLENOL ) 500 MG tablet Take 1,000 mg by mouth 2 (two) times daily as needed for moderate pain (pain score 4-6) or headache.     aspirin  EC 81 MG tablet Take 81 mg by mouth daily.     clopidogrel  (PLAVIX ) 75 MG tablet Take 75 mg by mouth daily.     loratadine  (CLARITIN ) 10 MG tablet Take 10 mg by mouth daily.     mirtazapine  (REMERON ) 15 MG tablet Take 15 mg by mouth at bedtime.     nitroGLYCERIN  (NITROSTAT ) 0.4 MG SL tablet Place 1 tablet (0.4 mg total) under the tongue every 5 (five) minutes as needed for chest pain. 25 tablet 3   oxyCODONE -acetaminophen  (PERCOCET/ROXICET) 5-325 MG tablet Take 1 tablet by mouth every 6 (six) hours as needed for moderate pain (pain score 4-6). (Patient not taking: Reported on 02/26/2024) 15 tablet 0   rosuvastatin  (CRESTOR ) 40 MG tablet Take 1 tablet (40 mg total) by mouth daily. (Patient taking differently: Take 40 mg by mouth at bedtime.) 90 tablet 3   traZODone   (DESYREL ) 50 MG tablet Take 50 mg by mouth at bedtime.     No current facility-administered medications for this visit.     ROS:  See HPI  Physical Exam:  Vitals:   03/28/24 1329 03/28/24 1333  BP: 138/64 133/62  Pulse: 66   Temp: 98 F (36.7 C)   TempSrc: Temporal   Weight: 212 lb 3.2 oz (96.3 kg)      Incision:  L neck well healed Extremities:  moving all ext well Neuro: CN grossly intact  Assessment/Plan:  This is a 78 y.o. male who is s/p: L TCAR  Subjectively he denies any strokelike symptoms since discharge.  He unfortunately did present to the emergency department on postoperative day #2 due to near syncopal event related to bradycardia.  He believes this has completely resolved.  His left neck incision has completely healed.  He is taking aspirin , Plavix , statin daily.  Duplex demonstrates a widely patent left ICA stent.  Right ICA stenosis estimated to be 60 to 79% by duplex.  Preoperative workup included CTA neck demonstrating an 80% stenosis of the right ICA.  Dr. Serene discussed potentially proceeding with staged right sided TCAR however I will discuss this with Dr. Serene prior to scheduling patient for surgery.  ADDENDUM 04/12/24: Case was discussed with Dr. Serene today.  Recommendation would be to proceed with right sided TCAR now that he has recovered  from his left sided TCAR.  He will continue his aspirin , Plavix , statin perioperatively.  Patient also is aware he may again experience bradycardia postoperatively like he did after his left TCAR.  Our schedulers will contact the patient later today or tomorrow to schedule right TCAR with Dr. Serene within the next few weeks.  Surgery was discussed with the patient including risks and he is agreeable to proceed.   Donnice Sender, PA-C [facility] [phone]  Clinic MD:  [place] on call

## 2024-03-28 ENCOUNTER — Ambulatory Visit: Admitting: Physician Assistant

## 2024-03-28 ENCOUNTER — Ambulatory Visit (HOSPITAL_COMMUNITY)
Admission: RE | Admit: 2024-03-28 | Discharge: 2024-03-28 | Disposition: A | Discharge status: Home / Self Care | Source: Ambulatory Visit | Attending: Surgery | Admitting: Surgery

## 2024-03-28 VITALS — BP 133/62 | HR 66 | Temp 98.0°F | Wt 212.2 lb

## 2024-03-28 DIAGNOSIS — I6523 Occlusion and stenosis of bilateral carotid arteries: Secondary | ICD-10-CM | POA: Insufficient documentation

## 2024-03-29 ENCOUNTER — Other Ambulatory Visit: Payer: Self-pay

## 2024-03-29 MED ORDER — PANTOPRAZOLE SODIUM 40 MG PO TBEC
40.0000 mg | DELAYED_RELEASE_TABLET | Freq: Two times a day (BID) | ORAL | 6 refills | Status: AC
Start: 2024-03-29 — End: ?

## 2024-04-05 ENCOUNTER — Ambulatory Visit: Attending: Physician Assistant | Admitting: Physician Assistant

## 2024-04-05 ENCOUNTER — Encounter: Payer: Self-pay | Admitting: Physician Assistant

## 2024-04-05 VITALS — BP 126/74 | HR 65 | Ht 73.0 in | Wt 210.0 lb

## 2024-04-05 DIAGNOSIS — I6523 Occlusion and stenosis of bilateral carotid arteries: Secondary | ICD-10-CM

## 2024-04-05 DIAGNOSIS — E785 Hyperlipidemia, unspecified: Secondary | ICD-10-CM

## 2024-04-05 DIAGNOSIS — I25119 Atherosclerotic heart disease of native coronary artery with unspecified angina pectoris: Secondary | ICD-10-CM | POA: Diagnosis not present

## 2024-04-05 DIAGNOSIS — Z8673 Personal history of transient ischemic attack (TIA), and cerebral infarction without residual deficits: Secondary | ICD-10-CM

## 2024-04-05 DIAGNOSIS — E861 Hypovolemia: Secondary | ICD-10-CM

## 2024-04-05 NOTE — Progress Notes (Signed)
  Cardiology Office Note:  .   Date:  04/05/2024  ID:  Martin [name], DOB [birth year], [name] [id number] PCP: Lennie Boom, MD  [facility] Providers Cardiologist:  Lynwood Schilling, MD    History of Present Illness: .   Martin [name] is a 78 y.o. male   with a history of CAD s/p CABG, post op atrial flutter, anxiety, depression, BPH, HLD, GERD and ulcerative colitis.  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 Serene on 02/24/2024.  He had an uneventful hospital stay and was discharged on postoperative day #1.  He unfortunately presented to the emergency department on postoperative day #2 due to dizziness and near syncope.  He was found to be bradycardic  and hypotensive in the emergency department. Symptoms felt to be related to hypovolemia and hypotension treated with 1 Liter of fluids.  Patient here with his wife. He feels better than he has in months. Everytime he has surgery his BP and HR drop. He got 5000 steps in yesterday but no regular exercise. They just moved and are cleaning out their old house. Denies chest pain, dyspnea, palpitations, dizziness, edema.   ROS:    Studies Reviewed: SABRA         Prior CV Studies:   Echo 07/2023 IMPRESSIONS     1. Left ventricular ejection fraction, by estimation, is 50 to 55%. The  left ventricle has low normal function. The left ventricle demonstrates  regional wall motion abnormalities (see scoring diagram/findings for  description). Left ventricular diastolic   parameters are consistent with Grade I diastolic dysfunction (impaired  relaxation).   2. Right ventricular systolic function is normal. The right ventricular  size is normal.   3. The mitral valve is normal in structure. No evidence of mitral valve  regurgitation. No evidence of mitral stenosis.   4. The aortic valve is grossly normal. Aortic valve regurgitation is  trivial. No aortic stenosis is  present.   5. The inferior vena cava is normal in size with greater than 50%  respiratory variability, suggesting right atrial pressure of 3 mmHg.    Risk Assessment/Calculations:             Physical Exam:   VS:  BP 126/74   Pulse 65   Ht 6' 1 (1.854 m)   Wt 210 lb (95.3 kg)   SpO2 95%   BMI 27.71 kg/m    Orhtostatics: No data found. Wt Readings from Last 3 Encounters:  04/05/24 210 lb (95.3 kg)  03/28/24 212 lb 3.2 oz (96.3 kg)  02/26/24 210 lb (95.3 kg)    GEN: Well nourished, well developed in no acute distress NECK: No JVD; right carotid bruits CARDIAC:  RRR, no murmurs, rubs, gallops RESPIRATORY:  Clear to auscultation without rales, wheezing or rhonchi  ABDOMEN: Soft, non-tender, non-distended EXTREMITIES:  No edema; No deformity   ASSESSMENT AND PLAN: .    CAD s/p CABG 2022-no angina -150 min exercise weekly -on ASA, Plavix , crestor   Hypotension & Bradycardia with dizziness after left transcarotid artery revasc improved with fluids. No recurrence.  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 Serene on 02/24/2024.   History of TIA  HLD-no lipid panel on chart since 2022. Will order and they will check with PCP.         Dispo: f/u in 6 months  Signed, Olivia Pavy, PA-C

## 2024-04-05 NOTE — Patient Instructions (Signed)
 Medication Instructions:  No changes *If you need a refill on your cardiac medications before your next appointment, please call your pharmacy*  Lab Work: We are going to need a fasting lipid panel drawn when he is fasting If you have labs (blood work) drawn today and your tests are completely normal, you will receive your results only by: MyChart Message (if you have MyChart) OR A paper copy in the mail If you have any lab test that is abnormal or we need to change your treatment, we will call you to review the results.  Testing/Procedures: No testing  Follow-Up: At [facility], you and your health needs are our priority.  As part of our continuing mission to provide you with exceptional heart care, our providers are all part of one team.  This team includes your primary Cardiologist (physician) and Advanced Practice Providers or APPs (Physician Assistants and Nurse Practitioners) who all work together to provide you with the care you need, when you need it.  Your next appointment:   6 month(s)  Provider:   Lynwood Schilling, MD    We recommend signing up for the patient portal called MyChart.  Sign up information is provided on this After Visit Summary.  MyChart is used to connect with patients for Virtual Visits (Telemedicine).  Patients are able to view lab/test results, encounter notes, upcoming appointments, etc.  Non-urgent messages can be sent to your provider as well.   To learn more about what you can do with MyChart, go to [email or web address].

## 2024-04-13 ENCOUNTER — Other Ambulatory Visit (HOSPITAL_COMMUNITY): Payer: Self-pay | Admitting: *Deleted

## 2024-04-13 DIAGNOSIS — I6523 Occlusion and stenosis of bilateral carotid arteries: Secondary | ICD-10-CM

## 2024-04-19 LAB — LIPID PANEL
Chol/HDL Ratio: 3.5 ratio (ref 0.0–5.0)
Cholesterol, Total: 131 mg/dL (ref 100–199)
HDL: 37 mg/dL — ABNORMAL LOW (ref >39)
LDL Chol Calc (NIH): 69 mg/dL (ref 0–99)
Triglycerides: 140 mg/dL (ref 0–149)
VLDL Cholesterol Cal: 25 mg/dL (ref 5–40)

## 2024-04-20 ENCOUNTER — Ambulatory Visit: Payer: Self-pay | Admitting: Physician Assistant

## 2024-04-26 ENCOUNTER — Ambulatory Visit: Admitting: Physician Assistant

## 2024-06-10 ENCOUNTER — Other Ambulatory Visit: Payer: Self-pay

## 2024-06-10 ENCOUNTER — Encounter (HOSPITAL_COMMUNITY): Payer: Self-pay | Admitting: Surgery

## 2024-06-10 DIAGNOSIS — I6523 Occlusion and stenosis of bilateral carotid arteries: Secondary | ICD-10-CM

## 2024-06-10 NOTE — Progress Notes (Signed)
 Surgical Instructions   Your procedure is scheduled on Wednesday June 15, 2024. Report to [facility] Main Entrance A at 6:30 A.M., then check in with the Admitting office. Any questions or running late day of surgery: call [phone]  Questions prior to your surgery date: call [phone], Monday-Friday, 8am-4pm. If you experience any cold or flu symptoms such as cough, fever, chills, shortness of breath, etc. between now and your scheduled surgery, please notify us  at the above number.     Remember:  Do not eat or drink after midnight the night before your surgery  Take these medicines the morning of surgery with A SIP OF WATER   aspirin   atorvastatin  (LIPITOR )  clopidogrel  (PLAVIX )  loratadine  (CLARITIN )   May take these medicines IF NEEDED: acetaminophen  (TYLENOL )  nitroGLYCERIN  (NITROSTAT ) If you have to take this medication prior to surgery, please call [phone] and report this to a nurse   One week prior to surgery, STOP taking any Aleve, Naproxen, Ibuprofen, Motrin, Advil, Goody's, BC's, all herbal medications, fish oil, and non-prescription vitamins.  This includes your meloxicam (MOBIC).                       Do NOT Smoke (Tobacco/Vaping) for 24 hours prior to your procedure.  If you use a CPAP at night, you may bring your mask/headgear for your overnight stay.   You will be asked to remove any contacts, glasses, piercing's, hearing aid's, dentures/partials prior to surgery. Please bring cases for these items if needed.    Patients discharged the day of surgery will not be allowed to drive home, and someone needs to stay with them for 24 hours.  SURGICAL WAITING ROOM VISITATION Patients may have no more than 2 support people in the waiting area - these visitors may rotate.   Pre-op nurse will coordinate an appropriate time for 1 ADULT support person, who may not rotate, to accompany patient in pre-op.  Children under the age of 71 must have an adult with  them who is not the patient and must remain in the main waiting area with an adult.  If the patient needs to stay at the hospital during part of their recovery, the visitor guidelines for inpatient rooms apply.  Please refer to the [facility] website for the visitor guidelines for any additional information.   If you received a COVID test during your pre-op visit  it is requested that you wear a mask when out in public, stay away from anyone that may not be feeling well and notify your surgeon if you develop symptoms. If you have been in contact with anyone that has tested positive in the last 10 days please notify you surgeon.      Pre-operative CHG Bathing Instructions   You can play a key role in reducing the risk of infection after surgery. Your skin needs to be as free of germs as possible. You can reduce the number of germs on your skin by washing with CHG (chlorhexidine  gluconate) soap before surgery. CHG is an antiseptic soap that kills germs and continues to kill germs even after washing.   DO NOT use if you have an allergy to chlorhexidine /CHG or antibacterial soaps. If your skin becomes reddened or irritated, stop using the CHG and notify one of our RNs at [phone].              TAKE A SHOWER THE NIGHT BEFORE SURGERY   Please keep in mind the following:  You may shave your face before/day of surgery.  Place clean sheets on your bed the night before surgery Use a clean washcloth (not used since being washed) for shower. DO NOT sleep with pet's night before surgery.  CHG Shower Instructions:  Wash your face and private area with normal soap. If you choose to wash your hair, wash first with your normal shampoo.  After you use shampoo/soap, rinse your hair and body thoroughly to remove shampoo/soap residue.  Turn the water  OFF and apply half the bottle of CHG soap to a CLEAN washcloth.  Apply CHG soap ONLY FROM YOUR NECK DOWN TO YOUR TOES (washing for 3-5 minutes)  DO NOT  use CHG soap on face, private areas, open wounds, or sores.  Pay special attention to the area where your surgery is being performed.  If you are having back surgery, having someone wash your back for you may be helpful. Wait 2 minutes after CHG soap is applied, then you may rinse off the CHG soap.  Pat dry with a clean towel  Put on clean pajamas    Additional instructions for the day of surgery: If you choose, you may shower the morning of surgery with an antibacterial soap.  DO NOT APPLY any lotions, deodorants or cologne.   Do not wear jewelry Do not bring valuables to the hospital. [facility] is not responsible for valuables/personal belongings. Put on clean/comfortable clothes.  Please brush your teeth.  Ask your nurse before applying any prescription medications to the skin.

## 2024-06-10 NOTE — Progress Notes (Signed)
 PCP - Dr Wellington Lee  Cardiologist - Dr Lynwood Schilling  Chest x-ray - 02/26/24 EKG - 02/26/24 Stress Test - 11/08/20 ECHO - 07/25/23 Cardiac Cath - 12/05/20  ICD Pacemaker/Loop - n/a  Sleep Study -  n/a  Diabetes - n/a  Aspirin  & Plavix  Blood Thinner Instructions:  Continue per MD.  NPO  Anesthesia review: Yes   STOP now taking any Aspirin  (unless otherwise instructed by your surgeon), Aleve, Naproxen, Ibuprofen, Motrin, Advil, Goody's, BC's, all herbal medications, fish oil, and all vitamins.   Coronavirus Screening Do you have any of the following symptoms:  Cough yes/no: No Fever (>100.20F)  yes/no: No Runny nose yes/no: No Sore throat yes/no: No Difficulty breathing/shortness of breath  yes/no: No  Have you traveled in the last 14 days and where? yes/no: No  Patient verbalized understanding of instructions that were given to them at the PAT appointment. Patient was also instructed that they will need to review over the PAT instructions again at home before surgery.

## 2024-06-10 NOTE — Progress Notes (Signed)
 PCP - Dr Wellington Lee Cardiologist - Dr Lynwood Schilling (last ov 04/05/24 w/APP; F/U 6 Months) Neurology - Dr Omega Mutton (last OV 02/03/24)  Chest x-ray - 02/26/24 EKG - 02/26/24 Stress Test - 11/08/20 ECHO - 07/24/23 Cardiac Cath - 12/05/20  ICD Pacemaker/Loop - n/a  Sleep Study -  n/a  Diabetes  - n/a  Plavix :  Continue per MD  Aspirin  Instructions: Continue per MD  NPO  Anesthesia review: Yes   STOP now taking any Aspirin  (unless otherwise instructed by your surgeon), Aleve, Naproxen, Ibuprofen, Motrin, Advil, Goody's, BC's, all herbal medications, fish oil, and all vitamins.   Coronavirus Screening Do you have any of the following symptoms:  Cough yes/no: No Fever (>100.40F)  yes/no: No Runny nose occasional r/t allergies Sore throat yes/no: No Difficulty breathing/shortness of breath  yes/no: No  Have you traveled in the last 14 days and where? yes/no: No  Patient verbalized understanding of instructions that were given to them at the PAT appointment. Patient was also instructed that they will need to review over the PAT instructions again at home before surgery.

## 2024-06-13 ENCOUNTER — Other Ambulatory Visit: Payer: Self-pay

## 2024-06-13 ENCOUNTER — Encounter (HOSPITAL_COMMUNITY)
Admission: RE | Admit: 2024-06-13 | Discharge: 2024-06-13 | Disposition: A | Discharge status: Home / Self Care | Source: Ambulatory Visit | Attending: Surgery | Admitting: Surgery

## 2024-06-13 ENCOUNTER — Encounter (HOSPITAL_COMMUNITY): Payer: Self-pay

## 2024-06-13 VITALS — BP 124/64 | HR 61 | Temp 98.2°F | Resp 18 | Ht 73.0 in | Wt 214.6 lb

## 2024-06-13 DIAGNOSIS — I2510 Atherosclerotic heart disease of native coronary artery without angina pectoris: Secondary | ICD-10-CM | POA: Insufficient documentation

## 2024-06-13 DIAGNOSIS — Z7902 Long term (current) use of antithrombotics/antiplatelets: Secondary | ICD-10-CM | POA: Insufficient documentation

## 2024-06-13 DIAGNOSIS — Z951 Presence of aortocoronary bypass graft: Secondary | ICD-10-CM | POA: Insufficient documentation

## 2024-06-13 DIAGNOSIS — E7800 Pure hypercholesterolemia, unspecified: Secondary | ICD-10-CM | POA: Insufficient documentation

## 2024-06-13 DIAGNOSIS — Z01818 Encounter for other preprocedural examination: Secondary | ICD-10-CM

## 2024-06-13 DIAGNOSIS — I5022 Chronic systolic (congestive) heart failure: Secondary | ICD-10-CM | POA: Insufficient documentation

## 2024-06-13 DIAGNOSIS — K5150 Left sided colitis without complications: Secondary | ICD-10-CM | POA: Insufficient documentation

## 2024-06-13 DIAGNOSIS — K449 Diaphragmatic hernia without obstruction or gangrene: Secondary | ICD-10-CM | POA: Insufficient documentation

## 2024-06-13 DIAGNOSIS — D649 Anemia, unspecified: Secondary | ICD-10-CM | POA: Insufficient documentation

## 2024-06-13 DIAGNOSIS — Z8673 Personal history of transient ischemic attack (TIA), and cerebral infarction without residual deficits: Secondary | ICD-10-CM | POA: Insufficient documentation

## 2024-06-13 DIAGNOSIS — Z01812 Encounter for preprocedural laboratory examination: Secondary | ICD-10-CM | POA: Insufficient documentation

## 2024-06-13 DIAGNOSIS — Z7982 Long term (current) use of aspirin: Secondary | ICD-10-CM | POA: Insufficient documentation

## 2024-06-13 DIAGNOSIS — I6523 Occlusion and stenosis of bilateral carotid arteries: Secondary | ICD-10-CM | POA: Insufficient documentation

## 2024-06-13 DIAGNOSIS — K219 Gastro-esophageal reflux disease without esophagitis: Secondary | ICD-10-CM | POA: Insufficient documentation

## 2024-06-13 HISTORY — DX: Unspecified hearing loss, unspecified ear: H91.90

## 2024-06-13 LAB — SURGICAL PCR SCREEN
MRSA, PCR: NEGATIVE
Staphylococcus aureus: POSITIVE — AB

## 2024-06-13 LAB — CBC
HCT: 39.3 % (ref 39.0–52.0)
Hemoglobin: 12.5 g/dL — ABNORMAL LOW (ref 13.0–17.0)
MCH: 30.3 pg (ref 26.0–34.0)
MCHC: 31.8 g/dL (ref 30.0–36.0)
MCV: 95.4 fL (ref 80.0–100.0)
Platelets: 265 K/uL (ref 150–400)
RBC: 4.12 MIL/uL — ABNORMAL LOW (ref 4.22–5.81)
RDW: 13 % (ref 11.5–15.5)
WBC: 8 K/uL (ref 4.0–10.5)
nRBC: 0 % (ref 0.0–0.2)

## 2024-06-13 LAB — COMPREHENSIVE METABOLIC PANEL WITH GFR
ALT: 21 U/L (ref 0–44)
AST: 22 U/L (ref 15–41)
Albumin: 3.7 g/dL (ref 3.5–5.0)
Alkaline Phosphatase: 95 U/L (ref 38–126)
Anion gap: 9 (ref 5–15)
BUN: 22 mg/dL (ref 8–23)
CO2: 23 mmol/L (ref 22–32)
Calcium: 9.3 mg/dL (ref 8.9–10.3)
Chloride: 105 mmol/L (ref 98–111)
Creatinine, Ser: 1.35 mg/dL — ABNORMAL HIGH (ref 0.61–1.24)
GFR, Estimated: 54 mL/min — ABNORMAL LOW (ref >60)
Glucose, Bld: 103 mg/dL — ABNORMAL HIGH (ref 70–99)
Potassium: 3.9 mmol/L (ref 3.5–5.1)
Sodium: 137 mmol/L (ref 135–145)
Total Bilirubin: 1.2 mg/dL (ref 0.0–1.2)
Total Protein: 7.3 g/dL (ref 6.5–8.1)

## 2024-06-13 LAB — URINALYSIS, ROUTINE W REFLEX MICROSCOPIC
Bilirubin Urine: NEGATIVE
Glucose, UA: NEGATIVE mg/dL
Ketones, ur: NEGATIVE mg/dL
Leukocytes,Ua: NEGATIVE
Nitrite: NEGATIVE
Protein, ur: NEGATIVE mg/dL
Specific Gravity, Urine: 1.01 (ref 1.005–1.030)
pH: 5 (ref 5.0–8.0)

## 2024-06-13 LAB — PROTIME-INR
INR: 1 (ref 0.8–1.2)
Prothrombin Time: 13.8 s (ref 11.4–15.2)

## 2024-06-13 LAB — TYPE AND SCREEN
ABO/RH(D): A NEG
Antibody Screen: NEGATIVE

## 2024-06-13 LAB — APTT: aPTT: 29 s (ref 24–36)

## 2024-06-14 NOTE — Progress Notes (Signed)
 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   His last cardiology follow-up was on 04/05/2024 with Parthenia Klinefelter, PA-C. He had been seen in the ED on 02/26/2024 for near syncope. He was able to lower himself to the ground to avoid fall. HR was 46 bpm. SBP 100-110.He was s/p left TCAR on 02/24/2024. Symptoms improved with IVF. At cardiology follow-up he denied recurrence. No angina. Six month follow-up planned.      He is on Plavix  for history of CVA (chronic infarcts noted on 05/2022 MRI, thought to have likely occurred post CABG) and is followed by neurology. Vascular surgery is following carotid artery stenosis. He remains on ASA and Plavix  for planned right TCAR.   Anesthesia team to evaluate on the day of surgery.  VS: BP 124/64   Pulse 61   Temp 36.8 C   Resp 18   Ht 6' 1 (1.854 m)   Wt 97.3 kg   SpO2 98%   BMI 28.31 kg/m    PROVIDERS: Lennie Boom, MD is PCP  Lavona Agent, MD is cardiologist Serene Gaile Dixons, MD is vascular surgeon Charlanne Groom, MD is GI Talitha Del, DO is neurologist Erminia) Rubin Calamity, MD is general surgeon   LABS: Labs reviewed: Acceptable for surgery. (all labs ordered are listed, but only abnormal results are displayed)  Labs Reviewed  SURGICAL PCR SCREEN - Abnormal; Notable for the following components:      Result Value   Staphylococcus aureus POSITIVE (*)    All other components within normal limits  CBC - Abnormal; Notable for the following components:   RBC 4.12 (*)    Hemoglobin 12.5 (*)    All other components within normal limits  COMPREHENSIVE METABOLIC PANEL WITH GFR - Abnormal; Notable for the following components:   Glucose, Bld 103 (*)    Creatinine, Ser 1.35 (*)    GFR, Estimated 54 (*)    All other components within normal limits  URINALYSIS, ROUTINE W REFLEX MICROSCOPIC - Abnormal; Notable for the following components:   Hgb urine dipstick SMALL (*)    Bacteria, UA RARE (*)    All other components within normal limits  PROTIME-INR  APTT  TYPE AND SCREEN     IMAGES: 1V CXR 02/26/2024: FINDINGS: Lungs are well expanded, symmetric, and clear. No pneumothorax or pleural effusion. Coronary artery bypass grafting has been performed. Cardiac size within normal limits. Pulmonary vascularity is normal. Osseous structures are age-appropriate. No acute bone abnormality. IMPRESSION: No active disease.  CTA Neck 01/21/2024: IMPRESSION: - Calcified and noncalcified atherosclerosis at the right carotid bifurcation resulting in approximately 80% stenosis at the origin of the right cervical ICA. - Additional atherosclerosis at the left carotid bifurcation extending into the proximal  cervical ICA. 1 cm distal to the bifurcation there is a focus of approximately 90% stenosis of the proximal left cervical ICA. - Mild stenosis at the origin of the left vertebral artery. - Mild stenosis at the origin of the left subclavian artery. - 1.1 cm nodule adjacent to the left parotid gland which may reflect a periparotid lymph node. Consider  ultrasound for further evaluation. - Aortic Atherosclerosis (ICD10-I70.0) and Emphysema (ICD10-J43.9).  CTA Chest/abd/pelvis 07/24/23 (Pre-hiatal hernia repair): IMPRESSION: 1. Atherosclerotic calcification with no evidence of aneurysm or dissection. 2. Large hiatal hernia with the stomach intrathoracic in location on the left. 3. Cholelithiasis. 4. Emphysema.   MRI Brain 05/12/22 (Canopy/PACS): IMPRESSION: No recent infarction, hemorrhage, or mass. Chronic infarcts detailed above.      EKG: 02/26/2024: Sinus bradycardia at 49 bpm with occasional Premature ventricular complexes Otherwise normal ECG When compared with ECG of 23-Jul-2023 18:00, PREVIOUS ECG IS PRESENT Confirmed by Ula Barter [phone]) on 02/26/2024 11:37:24 AM     CV: US  Carotid 03/28/2024: Summary:  Right Carotid: Velocities in the right ICA are consistent with a 60-79% stenosis.  Left Carotid: Patent stent with no stenosis.  Vertebrals: Bilateral vertebral arteries demonstrate antegrade flow.  Subclavians: Normal flow hemodynamics were seen in bilateral subclavian arteries.      Echo 07/25/2023: IMPRESSIONS   1. Left ventricular ejection fraction, by estimation, is 50 to 55%. The  left ventricle has low normal function. The left ventricle demonstrates  regional wall motion abnormalities (The apical lateral segment and apical anterior segment are akinetic). Left ventricular diastolic   parameters are consistent with Grade I diastolic dysfunction (impaired  relaxation).   2. Right ventricular systolic function is normal. The right ventricular  size is normal.   3. The mitral valve is normal in structure. No evidence of mitral valve  regurgitation. No evidence of mitral stenosis.   4. The aortic valve is grossly normal. Aortic valve regurgitation is  trivial. No aortic stenosis is present.   5. The inferior vena cava is normal in size with greater than 50%  respiratory variability, suggesting right atrial  pressure of 3 mmHg.  - Comparison 03/26/21 LVEF 60-65%, no RWMA, grade 1 DD, mildly reduced RVSF; 12/05/20 LVEF 45-50%, severe hypokinesis of mid-apical anterior wall and anteroseptal wall, severe akinesis of the of the entire apical segment     RLE Venous US  06/04/2023 (Canopy/PACS): IMPRESSION: No evidence of right lower extremity deep venous thrombosis.      Cardiac event monitor 07/17/2022 - 08/15/2022: Normal sinus rhythm One 3 beat run of non sustained ventricular tachycardia Rare isolated premature ventricular contractions No sustained arrhythmias No symptoms reported.      RHC 12/05/2020: Findings: Ao = 121/47 (55) LV = 125/10 RA = 1 RV =15/1 PA = 14/2 (8) PCW = 7 Fick cardiac output/index = 5.7/2.6 PVR = 0.2 WU Ao sat = 99% PA sat = 61%   Simultaneous RV/LV pressures show concordance with deep breathing   Assessment: 1. Low filling pressures with normal cardiac output and no evidence of tamponade.   Plan/Discussion: Continue medical therapy.     Last LHC was on 11/20/2020 pre-CABG.   Past Medical History:  Diagnosis Date   Anemia    Iron Deficiency   Anxiety and depression    Benign prostatic hypertrophy    Cancer (HCC) 2020   Prostate Cancer   Coronary artery disease    cath 04/2007 with 90-95% RCA stenosis. Had DES placed.  Had an acute stent thrombosis in the  hospital that day and subsequently had 3 DES placed in the RCA. The LAD had 45% stenosis, in the circ had 35% stenosis. The EF was well preserved.   Dyslipidemia    GERD (gastroesophageal reflux disease)    Hearing loss    bialteral - wears hearing aids   History of hiatal hernia    hx- had surgery   Hypercholesterolemia    Insomnia    Myocardial infarction [facility]) 2008   Peripheral vascular disease    Carotid Stenosis - Followed by Dr. Serene   Stroke [facility])    TIA   Ulcerative colitis     Past Surgical History:  Procedure Laterality Date   BIOPSY  07/25/2023   Procedure: BIOPSY;  Surgeon:  San Sandor GAILS, DO;  [facility];  Service: Gastroenterology;;  Barretts and H.Pylori r/o   CATARACT EXTRACTION Bilateral 2025   COLONOSCOPY  10/06/2016   Colonic polyps status post polypectomy. Previously diagnosed left sided ulcerative colitis now wiith only smudged vascular pattern (inactive endoscopically)   CORONARY ARTERY BYPASS GRAFT N/A 11/21/2020   Procedure: CORONARY ARTERY BYPASS GRAFTING (CABG) TIMES FOUR ON PUMP USING BILATERAL INTERNAL MAMMARY ARTERIES AND LEFT RADIAL ARTERY;  Surgeon: German Bartlett PEDLAR, MD;  Location[facility];  Service: Open Heart Surgery;  Laterality: N/A;  BIMA   ESOPHAGOGASTRODUODENOSCOPY (EGD) WITH PROPOFOL  N/A 07/25/2023   Procedure: ESOPHAGOGASTRODUODENOSCOPY (EGD) WITH PROPOFOL ;  Surgeon: San Sandor GAILS, DO;  [facility];  Service: Gastroenterology;  Laterality: N/A;   INGUINAL HERNIA REPAIR Left 1999   INSERTION PROSTATE RADIATION SEED  2020   LEFT HEART CATH AND CORONARY ANGIOGRAPHY N/A 11/20/2020   Procedure: LEFT HEART CATH AND CORONARY ANGIOGRAPHY;  Surgeon: Burnard Debby LABOR, MD;  [facility];  Service: Cardiovascular;  Laterality: N/A;   RADIAL ARTERY HARVEST Left 11/21/2020   Procedure: RADIAL ARTERY HARVEST;  Surgeon: German Bartlett PEDLAR, MD;  [facility];  Service: Open Heart Surgery;  Laterality: Left;   RIGHT HEART CATH N/A 12/05/2020   Procedure: RIGHT HEART CATH;  Surgeon: Cherrie Toribio SAUNDERS, MD;  [facility] CV [facility];  Service: Cardiovascular;  Laterality: N/A;   ROTATOR CUFF REPAIR Left    TEE WITHOUT CARDIOVERSION N/A 11/21/2020   Procedure: TRANSESOPHAGEAL ECHOCARDIOGRAM (TEE);  Surgeon: German Bartlett PEDLAR, MD;  Location: [facility] OR;  Service: Open Heart Surgery;  Laterality: N/A;   TRANSCAROTID ARTERY REVASCULARIZATION  Left 02/24/2024   Procedure: TRANSCAROTID ARTERY REVASCULARIZATION (TCAR);  Surgeon: Serene Gaile ORN, MD;  Location: [facility] OR;  Service: Vascular;  Laterality: Left;   XI  ROBOTIC ASSISTED HIATAL HERNIA REPAIR N/A 12/29/2023   Procedure: REPAIR, HERNIA, HIATAL, ROBOT-ASSISTED;  Surgeon: Rubin Calamity, MD;  Location[facility];  Service: General;  Laterality: N/A;  ROBOTIC HIATAL HERNIA REPAIR WITH FUNDOPLICATION AND MESH    MEDICATIONS:  acetaminophen  (TYLENOL ) 500 MG tablet   aspirin  EC 81 MG tablet   atorvastatin  (LIPITOR ) 40 MG tablet   clopidogrel  (PLAVIX ) 75 MG tablet   loratadine  (CLARITIN ) 10 MG tablet   meloxicam (MOBIC) 7.5 MG tablet   nitroGLYCERIN  (NITROSTAT ) 0.4 MG SL tablet   oxyCODONE -acetaminophen  (PERCOCET/ROXICET) 5-325 MG tablet   pantoprazole  (PROTONIX ) 40 MG tablet   rosuvastatin  (CRESTOR ) 40 MG tablet   traZODone  (DESYREL ) 50 MG tablet   No current facility-administered medications for this encounter.    Isaiah Ruder, PA-C Surgical Short Stay/Anesthesiology [facility] Phone [phone] [facility] Phone [phone] 06/14/2024 12:49 PM

## 2024-06-14 NOTE — Anesthesia Preprocedure Evaluation (Addendum)
 Anesthesia Evaluation  Patient identified by [name], date of birth, ID band Patient awake    Reviewed: Allergy & Precautions, NPO status , Patient's Chart, lab work & pertinent test results  Airway Mallampati: II  TM Distance: >3 FB Neck ROM: Full    Dental  (+) Dental Advisory Given, Edentulous Upper, Edentulous Lower   Pulmonary neg pulmonary ROS   Pulmonary exam normal breath sounds clear to auscultation       Cardiovascular hypertension, Pt. on medications + CAD, + Past MI, + Cardiac Stents, + CABG, + Peripheral Vascular Disease (Right carotid artery occlusion;  s/p left TCAR on 02/24/2024) and +CHF  Normal cardiovascular exam+ dysrhythmias Atrial Fibrillation  Rhythm:Regular Rate:Normal  Echo 07/25/23: 1. Left ventricular ejection fraction, by estimation, is 50 to 55%. The  left ventricle has low normal function. The left ventricle demonstrates  regional wall motion abnormalities (see scoring diagram/findings for  description). Left ventricular diastolic   parameters are consistent with Grade I diastolic dysfunction (impaired  relaxation).   2. Right ventricular systolic function is normal. The right ventricular  size is normal.   3. The mitral valve is normal in structure. No evidence of mitral valve  regurgitation. No evidence of mitral stenosis.   4. The aortic valve is grossly normal. Aortic valve regurgitation is  trivial. No aortic stenosis is present.   5. The inferior vena cava is normal in size with greater than 50%  respiratory variability, suggesting right atrial pressure of 3 mmHg.     Neuro/Psych  PSYCHIATRIC DISORDERS Anxiety Depression    CVA    GI/Hepatic Neg liver ROS, hiatal hernia, PUD,GERD  Medicated,,UC   Endo/Other  negative endocrine ROS    Renal/GU Renal InsufficiencyRenal disease     Musculoskeletal negative musculoskeletal ROS (+)    Abdominal   Peds  Hematology  (+) Blood dyscrasia  (Plavix ), anemia   Anesthesia Other Findings   Reproductive/Obstetrics                              Anesthesia Physical Anesthesia Plan  ASA: 4  Anesthesia Plan: General   Post-op Pain Management: Tylenol  PO (pre-op)*   Induction: Intravenous  PONV Risk Score and Plan: 2 and Dexamethasone , Ondansetron  and Treatment may vary due to age or medical condition  Airway Management Planned: Oral ETT  Additional Equipment: Arterial line  Intra-op Plan:   Post-operative Plan: Extubation in OR  Informed Consent: I have reviewed the patients History and Physical, chart, labs and discussed the procedure including the risks, benefits and alternatives for the proposed anesthesia with the patient or authorized representative who has indicated his/her understanding and acceptance.     Dental advisory given  Plan Discussed with: CRNA  Anesthesia Plan Comments: (PAT note written 06/14/2024 by Allison Zelenak, PA-C.  Left radial arterial line.  2nd large bore PIV.)         Anesthesia Quick Evaluation

## 2024-06-15 ENCOUNTER — Encounter (HOSPITAL_COMMUNITY): Payer: Self-pay | Admitting: Surgery

## 2024-06-15 ENCOUNTER — Encounter (HOSPITAL_COMMUNITY)
Admission: RE | Disposition: A | Discharge status: Home / Self Care | Payer: Self-pay | Source: Home / Self Care | Attending: Surgery

## 2024-06-15 ENCOUNTER — Inpatient Hospital Stay (HOSPITAL_COMMUNITY): Payer: Self-pay | Admitting: Anesthesiology

## 2024-06-15 ENCOUNTER — Inpatient Hospital Stay (HOSPITAL_COMMUNITY)
Admission: RE | Admit: 2024-06-15 | Discharge: 2024-06-16 | DRG: 036 | Disposition: A | Discharge status: Home / Self Care | Attending: Surgery | Admitting: Surgery

## 2024-06-15 ENCOUNTER — Inpatient Hospital Stay (HOSPITAL_COMMUNITY): Payer: Self-pay | Admitting: Physician Assistant

## 2024-06-15 ENCOUNTER — Other Ambulatory Visit: Payer: Self-pay

## 2024-06-15 ENCOUNTER — Inpatient Hospital Stay (HOSPITAL_COMMUNITY)

## 2024-06-15 DIAGNOSIS — Z951 Presence of aortocoronary bypass graft: Secondary | ICD-10-CM

## 2024-06-15 DIAGNOSIS — I6521 Occlusion and stenosis of right carotid artery: Secondary | ICD-10-CM

## 2024-06-15 DIAGNOSIS — F418 Other specified anxiety disorders: Secondary | ICD-10-CM | POA: Diagnosis not present

## 2024-06-15 DIAGNOSIS — F32A Depression, unspecified: Secondary | ICD-10-CM | POA: Diagnosis present

## 2024-06-15 DIAGNOSIS — Z8546 Personal history of malignant neoplasm of prostate: Secondary | ICD-10-CM | POA: Diagnosis not present

## 2024-06-15 DIAGNOSIS — H9193 Unspecified hearing loss, bilateral: Secondary | ICD-10-CM | POA: Diagnosis present

## 2024-06-15 DIAGNOSIS — I2510 Atherosclerotic heart disease of native coronary artery without angina pectoris: Secondary | ICD-10-CM

## 2024-06-15 DIAGNOSIS — Z7982 Long term (current) use of aspirin: Secondary | ICD-10-CM | POA: Diagnosis not present

## 2024-06-15 DIAGNOSIS — Z955 Presence of coronary angioplasty implant and graft: Secondary | ICD-10-CM | POA: Diagnosis not present

## 2024-06-15 DIAGNOSIS — I252 Old myocardial infarction: Secondary | ICD-10-CM | POA: Diagnosis not present

## 2024-06-15 DIAGNOSIS — Z8673 Personal history of transient ischemic attack (TIA), and cerebral infarction without residual deficits: Secondary | ICD-10-CM

## 2024-06-15 DIAGNOSIS — I10 Essential (primary) hypertension: Secondary | ICD-10-CM | POA: Diagnosis present

## 2024-06-15 DIAGNOSIS — K219 Gastro-esophageal reflux disease without esophagitis: Secondary | ICD-10-CM | POA: Diagnosis present

## 2024-06-15 DIAGNOSIS — Z885 Allergy status to narcotic agent status: Secondary | ICD-10-CM

## 2024-06-15 DIAGNOSIS — N400 Enlarged prostate without lower urinary tract symptoms: Secondary | ICD-10-CM | POA: Diagnosis present

## 2024-06-15 DIAGNOSIS — E7800 Pure hypercholesterolemia, unspecified: Secondary | ICD-10-CM | POA: Diagnosis present

## 2024-06-15 DIAGNOSIS — F419 Anxiety disorder, unspecified: Secondary | ICD-10-CM | POA: Diagnosis present

## 2024-06-15 DIAGNOSIS — I739 Peripheral vascular disease, unspecified: Secondary | ICD-10-CM | POA: Diagnosis present

## 2024-06-15 DIAGNOSIS — Z01818 Encounter for other preprocedural examination: Principal | ICD-10-CM

## 2024-06-15 DIAGNOSIS — Z7902 Long term (current) use of antithrombotics/antiplatelets: Secondary | ICD-10-CM

## 2024-06-15 DIAGNOSIS — Z974 Presence of external hearing-aid: Secondary | ICD-10-CM | POA: Diagnosis not present

## 2024-06-15 DIAGNOSIS — I959 Hypotension, unspecified: Secondary | ICD-10-CM | POA: Diagnosis not present

## 2024-06-15 DIAGNOSIS — Z888 Allergy status to other drugs, medicaments and biological substances status: Secondary | ICD-10-CM

## 2024-06-15 SURGERY — TRANSCAROTID ARTERY REVASCULARIZATION (TCAR)
Anesthesia: General | Site: Neck | Laterality: Right

## 2024-06-15 MED ORDER — PROTAMINE SULFATE 10 MG/ML IV SOLN
INTRAVENOUS | Status: DC | PRN
  Administered 2024-06-15: 50 mg via INTRAVENOUS

## 2024-06-15 MED ORDER — HEPARIN 6000 UNIT IRRIGATION SOLUTION
Status: DC | PRN
  Administered 2024-06-15: 1

## 2024-06-15 MED ORDER — PROPOFOL 10 MG/ML IV BOLUS
INTRAVENOUS | Status: AC
  Filled 2024-06-15: qty 20

## 2024-06-15 MED ORDER — GLYCOPYRROLATE PF 0.2 MG/ML IJ SOSY
PREFILLED_SYRINGE | INTRAMUSCULAR | Status: DC | PRN
  Administered 2024-06-15: .2 mg via INTRAVENOUS

## 2024-06-15 MED ORDER — SODIUM CHLORIDE 0.9 % IV SOLN: INTRAVENOUS | Status: DC

## 2024-06-15 MED ORDER — PSEUDOEPHEDRINE HCL 30 MG PO TABS
30.0000 mg | ORAL_TABLET | Freq: Four times a day (QID) | ORAL | Status: DC | PRN
  Administered 2024-06-15: 30 mg via ORAL
  Filled 2024-06-15 (×2): qty 1

## 2024-06-15 MED ORDER — ROCURONIUM BROMIDE 10 MG/ML (PF) SYRINGE
PREFILLED_SYRINGE | INTRAVENOUS | Status: AC
  Filled 2024-06-15: qty 10

## 2024-06-15 MED ORDER — EPHEDRINE SULFATE-NACL 50-0.9 MG/10ML-% IV SOSY
PREFILLED_SYRINGE | INTRAVENOUS | Status: DC | PRN
  Administered 2024-06-15 (×3): 5 mg via INTRAVENOUS
  Administered 2024-06-15: 10 mg via INTRAVENOUS

## 2024-06-15 MED ORDER — CHLORHEXIDINE GLUCONATE 0.12 % MT SOLN
15.0000 mL | Freq: Once | OROMUCOSAL | Status: AC
  Administered 2024-06-15: 15 mL via OROMUCOSAL
  Filled 2024-06-15: qty 15

## 2024-06-15 MED ORDER — 0.9 % SODIUM CHLORIDE (POUR BTL) OPTIME
TOPICAL | Status: DC | PRN
  Administered 2024-06-15: 1000 mL

## 2024-06-15 MED ORDER — PHENYLEPHRINE HCL-NACL 20-0.9 MG/250ML-% IV SOLN
INTRAVENOUS | Status: DC | PRN
  Administered 2024-06-15: 40 ug/min via INTRAVENOUS

## 2024-06-15 MED ORDER — GLYCOPYRROLATE PF 0.2 MG/ML IJ SOSY
PREFILLED_SYRINGE | INTRAMUSCULAR | Status: AC
  Filled 2024-06-15: qty 1

## 2024-06-15 MED ORDER — ACETAMINOPHEN 650 MG RE SUPP: 325.0000 mg | RECTAL | Status: DC | PRN

## 2024-06-15 MED ORDER — SODIUM CHLORIDE 0.9 % IV SOLN: 500.0000 mL | Freq: Once | INTRAVENOUS | Status: DC | PRN

## 2024-06-15 MED ORDER — SUGAMMADEX SODIUM 200 MG/2ML IV SOLN
INTRAVENOUS | Status: DC | PRN
  Administered 2024-06-15 (×2): 100 mg via INTRAVENOUS

## 2024-06-15 MED ORDER — LIDOCAINE HCL (PF) 1 % IJ SOLN
INTRAMUSCULAR | Status: AC
Start: 2024-06-15 — End: 2024-06-15
  Filled 2024-06-15: qty 5

## 2024-06-15 MED ORDER — ONDANSETRON HCL 4 MG/2ML IJ SOLN
INTRAMUSCULAR | Status: DC | PRN
  Administered 2024-06-15: 4 mg via INTRAVENOUS

## 2024-06-15 MED ORDER — PHENYLEPHRINE 80 MCG/ML (10ML) SYRINGE FOR IV PUSH (FOR BLOOD PRESSURE SUPPORT)
PREFILLED_SYRINGE | INTRAVENOUS | Status: DC | PRN
  Administered 2024-06-15: 80 ug via INTRAVENOUS

## 2024-06-15 MED ORDER — ONDANSETRON HCL 4 MG/2ML IJ SOLN: 4.0000 mg | Freq: Once | INTRAMUSCULAR | Status: DC | PRN

## 2024-06-15 MED ORDER — CHLORHEXIDINE GLUCONATE CLOTH 2 % EX PADS: 6.0000 | MEDICATED_PAD | Freq: Once | CUTANEOUS | Status: DC

## 2024-06-15 MED ORDER — ONDANSETRON HCL 4 MG/2ML IJ SOLN
INTRAMUSCULAR | Status: AC
  Filled 2024-06-15: qty 2

## 2024-06-15 MED ORDER — PROPOFOL 10 MG/ML IV BOLUS
INTRAVENOUS | Status: DC | PRN
Start: 2024-06-15 — End: 2024-06-15
  Administered 2024-06-15: 100 mg via INTRAVENOUS

## 2024-06-15 MED ORDER — HEPARIN SODIUM (PORCINE) 1000 UNIT/ML IJ SOLN
INTRAMUSCULAR | Status: AC
  Filled 2024-06-15: qty 10

## 2024-06-15 MED ORDER — BISACODYL 10 MG RE SUPP: 10.0000 mg | Freq: Every day | RECTAL | Status: DC | PRN

## 2024-06-15 MED ORDER — ONDANSETRON HCL 4 MG/2ML IJ SOLN
4.0000 mg | Freq: Four times a day (QID) | INTRAMUSCULAR | Status: DC | PRN
  Administered 2024-06-16: 4 mg via INTRAVENOUS
  Filled 2024-06-15: qty 2

## 2024-06-15 MED ORDER — EPHEDRINE 5 MG/ML INJ
INTRAVENOUS | Status: AC
  Filled 2024-06-15: qty 5

## 2024-06-15 MED ORDER — LIDOCAINE 2% (20 MG/ML) 5 ML SYRINGE
INTRAMUSCULAR | Status: AC
  Filled 2024-06-15: qty 5

## 2024-06-15 MED ORDER — HYDROMORPHONE HCL 1 MG/ML IJ SOLN: 0.5000 mg | INTRAMUSCULAR | Status: DC | PRN

## 2024-06-15 MED ORDER — ROCURONIUM BROMIDE 10 MG/ML (PF) SYRINGE
PREFILLED_SYRINGE | INTRAVENOUS | Status: DC | PRN
  Administered 2024-06-15: 20 mg via INTRAVENOUS
  Administered 2024-06-15: 50 mg via INTRAVENOUS

## 2024-06-15 MED ORDER — LACTATED RINGERS IV SOLN: INTRAVENOUS | Status: DC | PRN

## 2024-06-15 MED ORDER — CLEVIDIPINE BUTYRATE 0.5 MG/ML IV EMUL
INTRAVENOUS | Status: DC | PRN
Start: 2024-06-15 — End: 2024-06-15
  Administered 2024-06-15: 1 mg/h via INTRAVENOUS

## 2024-06-15 MED ORDER — HYDRALAZINE HCL 20 MG/ML IJ SOLN: 5.0000 mg | INTRAMUSCULAR | Status: DC | PRN

## 2024-06-15 MED ORDER — HEPARIN 6000 UNIT IRRIGATION SOLUTION
Status: AC
Start: 2024-06-15 — End: 2024-06-15
  Filled 2024-06-15: qty 500

## 2024-06-15 MED ORDER — OXYCODONE-ACETAMINOPHEN 5-325 MG PO TABS: 1.0000 | ORAL_TABLET | Freq: Four times a day (QID) | ORAL | Status: DC | PRN

## 2024-06-15 MED ORDER — LIDOCAINE 2% (20 MG/ML) 5 ML SYRINGE
INTRAMUSCULAR | Status: DC | PRN
  Administered 2024-06-15: 100 mg via INTRAVENOUS

## 2024-06-15 MED ORDER — DOCUSATE SODIUM 100 MG PO CAPS
100.0000 mg | ORAL_CAPSULE | Freq: Every day | ORAL | Status: DC
  Filled 2024-06-15: qty 1

## 2024-06-15 MED ORDER — PROTAMINE SULFATE 10 MG/ML IV SOLN
INTRAVENOUS | Status: AC
Start: 2024-06-15 — End: 2024-06-15
  Filled 2024-06-15: qty 5

## 2024-06-15 MED ORDER — NITROGLYCERIN 0.4 MG SL SUBL: 0.4000 mg | SUBLINGUAL_TABLET | SUBLINGUAL | Status: DC | PRN

## 2024-06-15 MED ORDER — CLOPIDOGREL BISULFATE 75 MG PO TABS
75.0000 mg | ORAL_TABLET | Freq: Every day | ORAL | Status: DC
  Administered 2024-06-16: 75 mg via ORAL
  Filled 2024-06-15: qty 1

## 2024-06-15 MED ORDER — CEFAZOLIN SODIUM-DEXTROSE 2-4 GM/100ML-% IV SOLN
2.0000 g | INTRAVENOUS | Status: AC
  Administered 2024-06-15: 2 g via INTRAVENOUS
  Filled 2024-06-15: qty 100

## 2024-06-15 MED ORDER — LACTATED RINGERS IV SOLN: INTRAVENOUS | Status: DC

## 2024-06-15 MED ORDER — FENTANYL CITRATE (PF) 100 MCG/2ML IJ SOLN
25.0000 ug | INTRAMUSCULAR | Status: DC | PRN
  Administered 2024-06-15: 50 ug via INTRAVENOUS

## 2024-06-15 MED ORDER — CEFAZOLIN SODIUM-DEXTROSE 2-4 GM/100ML-% IV SOLN
2.0000 g | Freq: Three times a day (TID) | INTRAVENOUS | Status: AC
  Administered 2024-06-15 (×2): 2 g via INTRAVENOUS
  Filled 2024-06-15 (×2): qty 100

## 2024-06-15 MED ORDER — DEXAMETHASONE SOD PHOSPHATE PF 10 MG/ML IJ SOLN
INTRAMUSCULAR | Status: DC | PRN
  Administered 2024-06-15: 5 mg via INTRAVENOUS

## 2024-06-15 MED ORDER — POLYETHYLENE GLYCOL 3350 17 G PO PACK: 17.0000 g | PACK | Freq: Every day | ORAL | Status: DC | PRN

## 2024-06-15 MED ORDER — CEFAZOLIN SODIUM-DEXTROSE 2-4 GM/100ML-% IV SOLN: 2.0000 g | INTRAVENOUS | Status: DC

## 2024-06-15 MED ORDER — ACETAMINOPHEN 325 MG PO TABS: 325.0000 mg | ORAL_TABLET | ORAL | Status: DC | PRN

## 2024-06-15 MED ORDER — FENTANYL CITRATE (PF) 250 MCG/5ML IJ SOLN
INTRAMUSCULAR | Status: AC
  Filled 2024-06-15: qty 5

## 2024-06-15 MED ORDER — ATROPINE SULFATE 0.4 MG/ML IV SOLN
INTRAVENOUS | Status: AC
  Filled 2024-06-15: qty 1

## 2024-06-15 MED ORDER — TRAZODONE HCL 50 MG PO TABS: 50.0000 mg | ORAL_TABLET | Freq: Every day | ORAL | Status: DC

## 2024-06-15 MED ORDER — POTASSIUM CHLORIDE CRYS ER 20 MEQ PO TBCR: 40.0000 meq | EXTENDED_RELEASE_TABLET | Freq: Every day | ORAL | Status: DC | PRN

## 2024-06-15 MED ORDER — METOPROLOL TARTRATE 5 MG/5ML IV SOLN: 2.5000 mg | INTRAVENOUS | Status: DC | PRN

## 2024-06-15 MED ORDER — ATORVASTATIN CALCIUM 40 MG PO TABS
40.0000 mg | ORAL_TABLET | Freq: Every day | ORAL | Status: DC
  Administered 2024-06-15 – 2024-06-16 (×2): 40 mg via ORAL
  Filled 2024-06-15 (×2): qty 1

## 2024-06-15 MED ORDER — FENTANYL CITRATE (PF) 250 MCG/5ML IJ SOLN
INTRAMUSCULAR | Status: DC | PRN
  Administered 2024-06-15 (×2): 50 ug via INTRAVENOUS

## 2024-06-15 MED ORDER — LORATADINE 10 MG PO TABS
10.0000 mg | ORAL_TABLET | Freq: Every day | ORAL | Status: DC
  Administered 2024-06-16: 10 mg via ORAL
  Filled 2024-06-15: qty 1

## 2024-06-15 MED ORDER — ASPIRIN 81 MG PO TBEC
81.0000 mg | DELAYED_RELEASE_TABLET | Freq: Every day | ORAL | Status: DC
  Administered 2024-06-16: 81 mg via ORAL
  Filled 2024-06-15: qty 1

## 2024-06-15 MED ORDER — SURGIFLO WITH THROMBIN (HEMOSTATIC MATRIX KIT) OPTIME
TOPICAL | Status: DC | PRN
  Administered 2024-06-15: 1 via TOPICAL

## 2024-06-15 MED ORDER — FENTANYL CITRATE (PF) 100 MCG/2ML IJ SOLN
INTRAMUSCULAR | Status: AC
  Filled 2024-06-15: qty 2

## 2024-06-15 MED ORDER — ORAL CARE MOUTH RINSE: 15.0000 mL | Freq: Once | OROMUCOSAL | Status: AC

## 2024-06-15 MED ORDER — IODIXANOL 320 MG/ML IV SOLN
INTRAVENOUS | Status: DC | PRN
  Administered 2024-06-15: 12 mL via INTRA_ARTERIAL

## 2024-06-15 MED ORDER — PHENOL 1.4 % MT LIQD: 1.0000 | OROMUCOSAL | Status: DC | PRN

## 2024-06-15 MED ORDER — HEPARIN SODIUM (PORCINE) 1000 UNIT/ML IJ SOLN
INTRAMUSCULAR | Status: DC | PRN
  Administered 2024-06-15: 10000 [IU] via INTRAVENOUS

## 2024-06-15 SURGICAL SUPPLY — 38 items
BAG BANDED W/RUBBER/TAPE 36X54 (MISCELLANEOUS) ×1 IMPLANT
BAG COUNTER SPONGE SURGICOUNT (BAG) ×1 IMPLANT
CANISTER SUCTION 3000ML PPV (SUCTIONS) ×1 IMPLANT
CATH BALLN ENROUTE 5X35 (CATHETERS) IMPLANT
CATH SUCT 10FR WHISTLE TIP (CATHETERS) ×1 IMPLANT
CLIP TI MEDIUM 6 (CLIP) ×1 IMPLANT
CLIP TI WIDE RED SMALL 6 (CLIP) ×1 IMPLANT
COVER DOME SNAP 22 D (MISCELLANEOUS) ×1 IMPLANT
COVER PROBE W GEL 5X96 (DRAPES) ×1 IMPLANT
DERMABOND ADVANCED .7 DNX12 (GAUZE/BANDAGES/DRESSINGS) IMPLANT
DRAPE FEMORAL ANGIO 80X135IN (DRAPES) ×1 IMPLANT
ELECTRODE REM PT RTRN 9FT ADLT (ELECTROSURGICAL) ×1 IMPLANT
GLOVE BIOGEL PI IND STRL 8 (GLOVE) ×1 IMPLANT
GLOVE SURG SS PI 7.5 STRL IVOR (GLOVE) ×1 IMPLANT
GOWN STRL REUS W/ TWL LRG LVL3 (GOWN DISPOSABLE) ×2 IMPLANT
GOWN STRL REUS W/ TWL XL LVL3 (GOWN DISPOSABLE) ×1 IMPLANT
GUIDEWIRE ENROUTE 0.014 (WIRE) ×1 IMPLANT
KIT BASIN OR (CUSTOM PROCEDURE TRAY) ×1 IMPLANT
KIT ENCORE 26 ADVANTAGE (KITS) ×1 IMPLANT
KIT INTRODUCER GALT 7 (INTRODUCER) ×1 IMPLANT
KIT TURNOVER KIT B (KITS) ×1 IMPLANT
PACK CAROTID (CUSTOM PROCEDURE TRAY) ×1 IMPLANT
POSITIONER HEAD DONUT 9IN (MISCELLANEOUS) ×1 IMPLANT
SET MICROPUNCTURE 5F STIFF (MISCELLANEOUS) ×1 IMPLANT
SOLN STERILE WATER 1000 ML (IV SOLUTION) ×1 IMPLANT
SOLN STERILE WATER BTL 1000 ML (IV SOLUTION) ×1 IMPLANT
STENT TRANSCAROTID SYSTEM 9X40 (Permanent Stent) IMPLANT
SURGIFLO W/THROMBIN 8M KIT (HEMOSTASIS) IMPLANT
SUT PROLENE 5 0 C 1 24 (SUTURE) ×2 IMPLANT
SUT SILK 2 0 PERMA HAND 18 BK (SUTURE) ×1 IMPLANT
SUT VIC AB 3-0 SH 27X BRD (SUTURE) ×2 IMPLANT
SUT VIC AB 4-0 PS2 27 (SUTURE) ×1 IMPLANT
SYR 10ML LL (SYRINGE) ×3 IMPLANT
SYR 20ML LL LF (SYRINGE) ×1 IMPLANT
SYR CONTROL 10ML LL (SYRINGE) IMPLANT
SYSTEM ENROUTE TCAR NEURO PLUS (FILTER) IMPLANT
TOWEL GREEN STERILE (TOWEL DISPOSABLE) ×1 IMPLANT
WIRE BENTSON .035X145CM (WIRE) ×1 IMPLANT

## 2024-06-15 NOTE — Anesthesia Procedure Notes (Addendum)
 Arterial Line Insertion Start/End10/15/2025 7:40 AM, 06/15/2024 7:43 AM Performed by: Harrold Macintosh, CRNA, CRNA  Patient location: Pre-op. Preanesthetic checklist: patient identified, IV checked, site marked, risks and benefits discussed, surgical consent, monitors and equipment checked, pre-op evaluation, timeout performed and anesthesia consent Lidocaine  1% used for infiltration Right, radial was placed Catheter size: 20 G Hand hygiene performed  and maximum sterile barriers used  Allen's test indicative of satisfactory collateral circulation Attempts: 1 Procedure performed using ultrasound to evaluate access site. Ultrasound Notes:relevant anatomy identified, ultrasound used to visualize needle entry, vessel patent under ultrasound and image(s) printed for medical record. Following insertion, Biopatch and dressing applied. Post procedure assessment: normal and unchanged  Patient tolerated the procedure well with no immediate complications. Additional procedure comments: Patient reports h/o multiple attempts for arterial access previous surgery.SABRA

## 2024-06-15 NOTE — Progress Notes (Addendum)
  Day of Surgery Note    Subjective:  resting in pacu; no complaints   Vitals:   06/15/24 1030 06/15/24 1045  BP: (!) 116/54 (!) 129/57  Pulse: 72 71  Resp: 13 14  Temp:    SpO2: 95% 95%    Incisions:   clean and dry without hematoma Neuro:  moving all extremities equally; tongue is midline Cardiac:  regular Lungs:  non labored    Assessment/Plan:  This is a 78 y.o. male who is s/p  Right TCAR for asymptomatic carotid artery stenosis  -pt doing well in recovery and neuro in tact. -a line is positional.  Systolic pressures in the 110's-120's.  Dr. Serene discussed with RN to keep systolic pressure above 100.  Given pt's hx, will start pseudoephedrine for a couple of days.   -continue asa/statin/plavix .  -to 4 east later today   [employer], PA-C 06/15/2024 10:55 AM [phone]   I agree with the above.  Patient seen in the PACU.  Status post right sided TCAR.  He is neurologic intact.  Incisions are without hematoma.  Stable for transfer to the floor  Malvina Serene

## 2024-06-15 NOTE — Op Note (Signed)
 Patient name: Martin [name] [name]: [id number] DOB: September 20, 1945 Sex: male  06/15/2024 Pre-operative Diagnosis: Asymptomatic right carotid stenosis Post-operative diagnosis:  Same Surgeon:  Malvina New Assistants:  Lucie Apt, PA Procedure:   #1: Right sided TCAR (carotid stent)  (CPT 037 (H/J/K/L) 3 (D/C/F/G) Z)   #2: Ultrasound-guided access, left common femoral vein   #3: Retrograde flow reversal neuroprotection CPT ([id number]) Anesthesia:  General Blood Loss:  minimal Specimens:  none  Findings: 70% stenosis.  Significant debris in the filter Stent:  ENROUTE 9 x 40 Predilation balloon 5 x 35 Contrast: 12 cc Flow reversal time 6 minutes Fluoroscopy: 2 minutes Procedure time: 42 minutes Dose area 1126.64  Indications: This is a 78 year old gentleman who has undergone left-sided TCAR.  He also has a high-grade right sided stenosis by CT scan and comes in today for right sided TCAR  Procedure:  The patient was identified in the holding area and taken to [facility] OR ROOM 16  The patient was then placed supine on the table. general anesthesia was administered.  The patient was prepped and draped in the usual sterile fashion.  A time out was called and antibiotics were administered.  A PA was necessary to expedite the procedure and assist with technical details.  She helped with exposure by providing suction and retraction.  She helped with wire exchanges and sheath removal.  Ultrasound was used to evaluate the left femoral vein which was widely patent and easily compressible.  It was cannulated under ultrasound guidance with a micropuncture needle.  A 018 wire was inserted followed by placing the micropuncture sheath.  Next a Bentson wire was placed followed by the TCAR sheath which flushed and aspirated.  Attention was then turned towards the right neck.  A transverse incision was made just above the clavicle.  Cautery was used to divide subcutaneous tissue and platysma muscle.  An  avascular plane was developed between the 2 heads of the sternocleidomastoid and the carotid artery was circumferentially dissected free.  The nerve was protected.  The carotid was encircled with a vessel loop and umbilical tape.  Patient was fully heparinized.  A 5-0 Prolene U-stitch was placed in the adventitia on the anterior surface of the carotid artery.  This was cannulated then with a micropuncture needle.  An 018 wire was inserted up to the mark and a micropuncture sheath was placed at 3 cm.  A contrast injection was performed locating the bifurcation.  A carotid Amplatz wire was then inserted up to the marked line on the screen and then the TCAR sheath was placed under fluoroscopic visualization.  This was sutured to the skin with silk suture.  The flow reversal tubing was connected.  Passive flow reversal was confirmed with a saline flush.  Next a TCAR timeout was performed.  The vessel loop on the proximal common carotid artery was occluded for active flow reversal which was confirmed with a saline flush.  The image detector was rotated to a RAO position and a contrast injection was performed identifying the internal carotid artery which was marked on the screen.  Next a 014 wire was inserted and navigated into the internal carotid artery.  This was followed by a 5 x 3.5 balloon.  The lesion was then predilated with the balloon.  There was a moderate hemodynamic response with drop in heart rate and blood pressure.  The balloon was then removed and a 9 x 40 stent was placed.  We waited 2 minutes and took  completion imaging in different views.  The stent was widely patent.  Wire was then removed.  Flow reversal was then discontinued.  Blood was returned from the tubing into the sheath in the groin.  The TCAR sheath was removed from the carotid artery and the arteriotomy site closed by securing the previously placed 5-0 Prolene.  There were excellent Doppler signals in the common carotid artery.  The  patient's heparin  was reversed with 50 mg of protamine .  The sheath in the groin was removed and manual pressure was held for hemostasis.  The neck incision was closed by reapproximating the platysma with 3-0 Vicryl and the skin with 4-0 Vicryl followed by Dermabond.  The patient was successfully extubated and taken recovery in stable condition.  He was neurologically intact.   Disposition: To PACU stable and neurologically intact   V. Malvina New, M.D., [facility] [place] Office: [phone] Pager:  [phone]

## 2024-06-15 NOTE — Discharge Instructions (Signed)

## 2024-06-15 NOTE — Anesthesia Procedure Notes (Signed)
 Procedure Name: Intubation Date/Time: 06/15/2024 8:43 AM  Performed by: Atanacio Arland HERO, CRNAPre-anesthesia Checklist: Patient identified, Emergency Drugs available, Suction available and Patient being monitored Patient Re-evaluated:Patient Re-evaluated prior to induction Oxygen Delivery Method: Circle System Utilized Preoxygenation: Pre-oxygenation with 100% oxygen Induction Type: IV induction Ventilation: Mask ventilation without difficulty Laryngoscope Size: Mac and 4 Grade View: Grade I Tube type: Oral Tube size: 7.5 mm Number of attempts: 1 Airway Equipment and Method: Stylet Placement Confirmation: ETT inserted through vocal cords under direct vision, positive ETCO2 and breath sounds checked- equal and bilateral Secured at: 23 cm Tube secured with: Tape Dental Injury: Teeth and Oropharynx as per pre-operative assessment

## 2024-06-15 NOTE — Anesthesia Postprocedure Evaluation (Signed)
 Anesthesia Post Note  Patient: Martin [name]  Procedure(s) Performed: RIGHT TRANSCAROTID ARTERY REVASCULARIZATION (Right: Neck) ULTRASOUND GUIDANCE, FOR VASCULAR ACCESS (Right: Neck)     Patient location during evaluation: PACU Anesthesia Type: General Level of consciousness: awake and alert Pain management: pain level controlled Vital Signs Assessment: post-procedure vital signs reviewed and stable Respiratory status: spontaneous breathing, nonlabored ventilation, respiratory function stable and patient connected to nasal cannula oxygen Cardiovascular status: blood pressure returned to baseline and stable Postop Assessment: no apparent nausea or vomiting Anesthetic complications: no   No notable events documented.  Last Vitals:  Vitals:   06/15/24 1245 06/15/24 1300  BP: 118/62 (!) 122/51  Pulse: 64 72  Resp: (!) 29 18  Temp:    SpO2: 95% 98%    Last Pain:  Vitals:   06/15/24 1300  TempSrc:   PainSc: 0-No pain                 Garnette FORBES Skillern

## 2024-06-15 NOTE — Transfer of Care (Signed)
 Immediate Anesthesia Transfer of Care Note  Patient: Martin [name]  Procedure(s) Performed: RIGHT TRANSCAROTID ARTERY REVASCULARIZATION (Right: Neck) ULTRASOUND GUIDANCE, FOR VASCULAR ACCESS (Right: Neck)  Patient Location: PACU  Anesthesia Type:General  Level of Consciousness: drowsy  Airway & Oxygen Therapy: Patient Spontanous Breathing  Post-op Assessment: Report given to RN and Post -op Vital signs reviewed and stable  Post vital signs: Reviewed and stable  Last Vitals:  Vitals Value Taken Time  BP 113/56 06/15/24 10:16  Temp 96.9 ax   Pulse 75 06/15/24 10:20  Resp 27 06/15/24 10:20  SpO2 91 % 06/15/24 10:20  Vitals shown include unfiled device data.  Last Pain:  Vitals:   06/15/24 0649  TempSrc:   PainSc: 0-No pain      Patients Stated Pain Goal: 0 (06/15/24 0649)  Complications: No notable events documented.

## 2024-06-15 NOTE — H&P (Signed)
   Patient name: Martin [name] [name]: [id number] DOB: 1946-08-27 Sex: male    HISTORY OF PRESENT ILLNESS:   Martin [name] is a 78 y.o. male who is status post left TCAR for asx disease.  He has known right sided >80% stenosis.  HE is asymptomatic  CURRENT MEDICATIONS:    Current Facility-Administered Medications  Medication Dose Route Frequency Provider Last Rate Last Admin   0.9 %  sodium chloride  infusion   Intravenous Continuous Jason Hauge, Gaile ORN, MD       0.9 %  sodium chloride  infusion   Intravenous Continuous Serene Gaile ORN, MD       ceFAZolin  (ANCEF ) IVPB 2g/100 mL premix  2 g Intravenous 30 min Pre-Op Serene Gaile ORN, MD       Chlorhexidine  Gluconate Cloth 2 % PADS 6 each  6 each Topical Once Nalanie Winiecki W, MD       And   Chlorhexidine  Gluconate Cloth 2 % PADS 6 each  6 each Topical Once Harshita Bernales W, MD       Chlorhexidine  Gluconate Cloth 2 % PADS 6 each  6 each Topical Once Zenia Guest W, MD       And   Chlorhexidine  Gluconate Cloth 2 % PADS 6 each  6 each Topical Once Serene Gaile ORN, MD       lactated ringers  infusion   Intravenous Continuous Corinne Garnette BRAVO, MD   New Bag at 06/15/24 0745   Facility-Administered Medications Ordered in Other Encounters  Medication Dose Route Frequency Provider Last Rate Last Admin   lactated ringers  infusion   Intravenous Continuous PRN Atanacio Arland HERO, CRNA   New Bag at 06/15/24 [phone]    REVIEW OF SYSTEMS:   [X]  denotes positive finding, [ ]  denotes negative finding Cardiac  Comments:  Chest pain or chest pressure:    Shortness of breath upon exertion:    Short of breath when lying flat:    Irregular heart rhythm:    Constitutional    Fever or chills:      PHYSICAL EXAM:   Vitals:   06/15/24 0639  BP: (!) 114/50  Pulse: (!) 58  Resp: 18  Temp: 97.6 F (36.4 C)  TempSrc: Oral  SpO2: 94%  Weight: 95.3 kg  Height: 6' 1 (1.854 m)    GENERAL: The patient is a well-nourished  male, in no acute distress. The vital signs are documented above. CARDIOVASCULAR: There is a regular rate and rhythm. PULMONARY: Non-labored respirations   STUDIES:   CTA:   Calcified and noncalcified atherosclerosis at the right carotid bifurcation resulting in approximately 80% stenosis at the origin of the right cervical ICA.   Additional atherosclerosis at the left carotid bifurcation extending into the proximal cervical ICA. 1 cm distal to the bifurcation there is a focus of approximately 90% stenosis of the proximal left cervical ICA.   Mild stenosis at the origin of the left vertebral artery.   Mild stenosis at the origin of the left subclavian artery. MEDICAL ISSUES:   Plan for right TCAR:  All questions answered.  Risks and benefits discussed  Malvina Serene CLORE, MD, FACS [facility] [phone] Pager [phone]

## 2024-06-16 ENCOUNTER — Encounter (HOSPITAL_COMMUNITY): Payer: Self-pay | Admitting: Surgery

## 2024-06-16 DIAGNOSIS — Z48812 Encounter for surgical aftercare following surgery on the circulatory system: Secondary | ICD-10-CM

## 2024-06-16 LAB — BASIC METABOLIC PANEL WITH GFR
Anion gap: 9 (ref 5–15)
BUN: 21 mg/dL (ref 8–23)
CO2: 21 mmol/L — ABNORMAL LOW (ref 22–32)
Calcium: 8.4 mg/dL — ABNORMAL LOW (ref 8.9–10.3)
Chloride: 107 mmol/L (ref 98–111)
Creatinine, Ser: 1.29 mg/dL — ABNORMAL HIGH (ref 0.61–1.24)
GFR, Estimated: 57 mL/min — ABNORMAL LOW (ref >60)
Glucose, Bld: 132 mg/dL — ABNORMAL HIGH (ref 70–99)
Potassium: 4 mmol/L (ref 3.5–5.1)
Sodium: 137 mmol/L (ref 135–145)

## 2024-06-16 LAB — CBC
HCT: 32.1 % — ABNORMAL LOW (ref 39.0–52.0)
Hemoglobin: 10.3 g/dL — ABNORMAL LOW (ref 13.0–17.0)
MCH: 30.1 pg (ref 26.0–34.0)
MCHC: 32.1 g/dL (ref 30.0–36.0)
MCV: 93.9 fL (ref 80.0–100.0)
Platelets: 211 K/uL (ref 150–400)
RBC: 3.42 MIL/uL — ABNORMAL LOW (ref 4.22–5.81)
RDW: 13.1 % (ref 11.5–15.5)
WBC: 12.7 K/uL — ABNORMAL HIGH (ref 4.0–10.5)
nRBC: 0 % (ref 0.0–0.2)

## 2024-06-16 LAB — LIPID PANEL
Cholesterol: 116 mg/dL (ref 0–200)
HDL: 29 mg/dL — ABNORMAL LOW (ref >40)
LDL Cholesterol: 60 mg/dL (ref 0–99)
Total CHOL/HDL Ratio: 4 ratio
Triglycerides: 133 mg/dL (ref <150)
VLDL: 27 mg/dL (ref 0–40)

## 2024-06-16 MED ORDER — OXYCODONE-ACETAMINOPHEN 5-325 MG PO TABS: 1.0000 | ORAL_TABLET | Freq: Four times a day (QID) | ORAL | 0 refills | Status: AC | PRN

## 2024-06-16 MED ORDER — PSEUDOEPHEDRINE HCL 30 MG PO TABS: 30.0000 mg | ORAL_TABLET | Freq: Four times a day (QID) | ORAL | 0 refills | Status: AC | PRN

## 2024-06-16 NOTE — Plan of Care (Signed)
  Problem: Clinical Measurements: Goal: Ability to maintain clinical measurements within normal limits will improve Outcome: Progressing Goal: Will remain free from infection Outcome: Progressing Goal: Diagnostic test results will improve Outcome: Progressing Goal: Respiratory complications will improve Outcome: Progressing Goal: Cardiovascular complication will be avoided Outcome: Progressing   Problem: Activity: Goal: Risk for activity intolerance will decrease Outcome: Progressing   Problem: Coping: Goal: Level of anxiety will decrease Outcome: Progressing   Problem: Elimination: Goal: Will not experience complications related to bowel motility Outcome: Progressing Goal: Will not experience complications related to urinary retention Outcome: Progressing

## 2024-06-16 NOTE — Discharge Summary (Signed)
 Carotid Discharge Summary     Martin [name] [birth year] 78 y.o. 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 The pt tolerated the procedure well and was transported to the PACU in good condition.  He did have some hypotension post operatively after he was transferred to the 4E floor. He was given Sudafed and this improved his blood pressures. 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 He remained asymptomatic during this period of hypotension. Resolved with the sudafed.   By POD 1, the pt neuro status remained intact. Right neck incision intact, mild fullness. Soft without hematoma. Left groin cannulation site without swelling or hematoma. Hemodynamically intact. Vitals stable.The remainder of the hospital course consisted of increasing mobilization and increasing intake of solids without difficulty. He remained stable for discharge home. He will resume home medications as prescribed including Aspirin , statin and Plavix . PDMP was reviewed and post operative pain medication will be sent to his pharmacy. He will also have 3 days of sudafed to take to avoid any bradycardia or hypotension events at home. He will have follow up arranged in our office in 1 month with carotid duplex.    Recent Labs    06/13/24 1100 06/16/24 0348  NA 137 137  K 3.9 4.0  CL 105 107  CO2 23 21*  GLUCOSE 103* 132*  BUN 22 21  CALCIUM  9.3 8.4*   Recent Labs    06/13/24 1100 06/16/24 0348  WBC 8.0 12.7*  HGB 12.5* 10.3*  HCT 39.3 32.1*  PLT 265 211   Recent Labs     06/13/24 1100  INR 1.0     Discharge Instructions     CAROTID Sugery: Call MD for difficulty swallowing or speaking; weakness in arms or legs that is a new symtom; severe headache.  If you have increased swelling in the neck and/or  are having difficulty breathing, CALL 911   Complete by: As directed    Call MD for:  redness, tenderness, or signs of infection (pain, swelling, bleeding, redness, odor or green/yellow discharge around incision site)   Complete by: As directed    Call MD for:  severe or increased pain, loss or decreased feeling  in affected limb(s)   Complete by: As directed    Call MD for:  temperature >100.5   Complete by: As directed    Discharge wound care:   Complete by: As directed    Keep incision dry for 24 hours. You can then wash with mild soap and water , pat dry. 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] Asymptomatic carotid artery stenosis without infarction,  right [I65.21]  Secondary Diagnosis: Patient Active Problem List   Diagnosis Date Noted   Asymptomatic carotid artery stenosis without infarction, right 06/15/2024   S/P carotid endarterectomy 02/24/2024   Carotid artery stenosis 02/24/2024   S/P Nissen fundoplication (without gastrostomy tube) procedure 12/29/2023   Barrett's esophagus without dysplasia 07/25/2023   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   Atypical chest pain 07/24/2023   Hiatal hernia 07/24/2023   Abdominal pain, epigastric 07/24/2023   Nausea and vomiting 07/24/2023   Elevated troponin 07/24/2023   Cholelithiasis  07/24/2023   Renal insufficiency 07/24/2023   Normocytic anemia 07/24/2023   BPH (benign prostatic hyperplasia) 07/24/2023   History of ulcerative colitis 07/24/2023   Atrial flutter (HCC) 12/05/2020   Pericardial effusion 12/05/2020   Acute systolic (congestive) heart failure (HCC) 12/05/2020   AKI (acute kidney injury) 12/05/2020   Lactic acidosis 12/05/2020   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   CAD (coronary artery disease), native coronary artery 11/20/2020   Abnormal nuclear stress test    Essential hypertension 11/01/2020   Hypertrophic cardiomyopathy (HCC) 12/11/2019   Educated about COVID-19 virus infection 12/11/2019   Dyslipidemia 07/07/2019   Stenosis of left carotid artery 07/07/2019   HLD (hyperlipidemia) 08/01/2009   Coronary artery disease involving native coronary artery of native heart with angina pectoris 08/01/2009   GERD 08/01/2009   Ulcerative colitis (HCC) 08/01/2009   PALPITATIONS 07/13/2009   Past Medical History:  Diagnosis Date   Anemia    Iron Deficiency   Anxiety and depression    Benign prostatic hypertrophy    Cancer (HCC) 2020   Prostate Cancer   Coronary artery disease    cath 04/2007 with 90-95% RCA stenosis. Had DES placed.  Had an acute stent thrombosis in the hospital that day and subsequently had 3 DES placed in the RCA. The LAD had 45% stenosis, in the circ had 35% stenosis. The EF was well preserved.   Dyslipidemia    GERD (gastroesophageal reflux disease)    Hearing loss    bialteral - wears hearing aids   History of hiatal hernia    hx- had surgery   Hypercholesterolemia    Insomnia    Myocardial infarction [facility]) 2008   Peripheral vascular disease    Carotid Stenosis - Followed by Dr. Serene   Stroke [facility])    TIA   Ulcerative colitis     Allergies as of 06/16/2024       Reactions   Flomax  [tamsulosin ] Other (See Comments)   Hypotension    Ms Contin  [morphine ] Other (See Comments)   Vasomotor symptoms   Toprol  Xl [metoprolol ]  Other (See Comments)   Hypotension         Medication List     STOP taking these medications    rosuvastatin  40 MG tablet Commonly known as: CRESTOR        TAKE these medications    acetaminophen  500 MG tablet Commonly known as: TYLENOL  Take 1,000 mg by mouth every 6 (six) hours as needed for moderate pain (pain score 4-6) or headache.   aspirin  EC 81 MG tablet Take 81 mg by mouth daily.   atorvastatin  40 MG tablet Commonly known as: LIPITOR  Take 40 mg by mouth daily.   clopidogrel  75 MG tablet Commonly known as: PLAVIX  Take 75 mg by mouth daily.   loratadine  10 MG tablet Commonly known as: CLARITIN  Take 10 mg by mouth daily.   meloxicam 7.5 MG tablet Commonly known as: MOBIC Take 7.5 mg by mouth daily as needed  for pain.   nitroGLYCERIN  0.4 MG SL tablet Commonly known as: NITROSTAT  Place 1 tablet (0.4 mg total) under the tongue every 5 (five) minutes as needed for chest pain.   oxyCODONE -acetaminophen  5-325 MG tablet Commonly known as: PERCOCET/ROXICET Take 1 tablet by mouth every 6 (six) hours as needed for severe pain (pain score 7-10). What changed: reasons to take this   pantoprazole  40 MG tablet Commonly known as: PROTONIX  Take 1 tablet (40 mg total) by mouth 2 (two) times daily.   pseudoephedrine 30 MG tablet Commonly known as: SUDAFED Take 1 tablet (30 mg total) by mouth every 6 (six) hours as needed for congestion.   traZODone  50 MG tablet Commonly known as: DESYREL  Take 50 mg by mouth at bedtime.               Discharge Care Instructions  (From admission, onward)           Start     Ordered   06/16/24 0000  Discharge wound care:       Comments: Keep incision dry for 24 hours. You can then wash with mild soap and water , pat dry. Do not soak in bathtub, pool, etc   06/16/24 0748             Discharge Instructions:   [facility] Discharge Instructions Carotid Endarterectomy  (CEA)  Please refer to the following instructions for your post-procedure care. Your surgeon or physician assistant will discuss any changes with you.  Activity  You are encouraged to walk as much as you can. You can slowly return to normal activities but must avoid strenuous activity and heavy lifting until your doctor tell you it's OK. Avoid activities such as vacuuming or swinging a golf club. You can drive after one week if you are comfortable and you are no longer taking prescription pain medications. It is normal to feel tired for serval weeks after your surgery. It is also normal to have difficulty with sleep habits, eating, and bowel movements after surgery. These will go away with time.  Bathing/Showering  You may shower after you come home. Do not soak in a bathtub, hot tub, or swim until the incision heals completely.  Incision Care  Shower every day. Clean your incision with mild soap and water . Pat the area dry with a clean towel. You do not need a bandage unless otherwise instructed. Do not apply any ointments or creams to your incision. You may have skin glue on your incision. Do not peel it off. It will come off on its own in about one week. Your incision may feel thickened and raised for several weeks after your surgery. This is normal and the skin will soften over time. For Men Only: It's OK to shave around the incision but do not shave the incision itself for 2 weeks. It is common to have numbness under your chin that could last for several months.  Diet  Resume your normal diet. There are no special food restrictions following this procedure. A low fat/low cholesterol diet is recommended for all patients with vascular disease. In order to heal from your surgery, it is CRITICAL to get adequate nutrition. Your body requires vitamins, minerals, and protein. Vegetables are the best source of vitamins and minerals. Vegetables also provide the perfect balance of protein. Processed  food has little nutritional value, so try to avoid this.  Medications  Resume taking all of your medications unless your doctor or physician assistant  tells you not to.  If your incision is causing pain, you may take over-the- counter pain relievers such as acetaminophen  (Tylenol ). If you were prescribed a stronger pain medication, please be aware these medications can cause nausea and constipation.  Prevent nausea by taking the medication with a snack or meal. Avoid constipation by drinking plenty of fluids and eating foods with a high amount of fiber, such as fruits, vegetables, and grains. Do not take Tylenol  if you are taking prescription pain medications.  Follow Up  Our office will schedule a follow up appointment 2-3 weeks following discharge.  Please call us  immediately for any of the following conditions  Increased pain, redness, drainage (pus) from your incision site. Fever of 101 degrees or higher. If you should develop stroke (slurred speech, difficulty swallowing, weakness on one side of your body, loss of vision) you should call 911 and go to the nearest emergency room.  Reduce your risk of vascular disease:  Stop smoking. If you would like help call [facility] at 1-800-QUIT-NOW (806-473-2050) or [facility] at 458-230-2821. Manage your cholesterol Maintain a desired weight Control your diabetes Keep your blood pressure down  If you have any questions, please call the office at [phone].  Prescriptions given: Pseudoephedrine 30 mg every 6 hours as needed # 12 No Refill Oxycodone -Acetaminophen  5-325 mg #10 no refills  Disposition: Home  Patient's condition: is Good  Follow up: 1. Dr. Serene in 1 month   Shanae Luo PA-C [facility] [phone]   --- For [facility] use ---   Modified Rankin score at D/C (0-6): 0  IV medication needed for:  1. Hypertension: No 2. Hypotension: No  Post-op Complications: No  1. Post-op CVA  or TIA: No  If yes: Event classification (right eye, left eye, right cortical, left cortical, verterobasilar, other): n/a  If yes: Timing of event (intra-op, <6 hrs post-op, >=6 hrs post-op, unknown): n/a  2. CN injury: No  If yes: CN not injuried   3. Myocardial infarction: No  If yes: Dx by (EKG or clinical, Troponin): n/a  4.  CHF: No  5.  Dysrhythmia (new): No  6. Wound infection: No  7. Reperfusion symptoms: No  8. Return to OR: No  If yes: return to OR for (bleeding, neurologic, other CEA incision, other): n/a  Discharge medications: Statin use:  Yes ASA use:  Yes   Beta blocker use:  No ACE-Inhibitor use:  No  ARB use:  No CCB use: No P2Y12 Antagonist use: Yes, [ X] Plavix , [ ]  Plasugrel, [ ]  Ticlopinine, [ ]  Ticagrelor, [ ]  Other, [ ]  No for medical reason, [ ]  Non-compliant, [ ]  Not-indicated Anti-coagulant use:  No, [ ]  Warfarin, [ ]  Rivaroxaban, [ ]  Dabigatran,

## 2024-06-16 NOTE — Progress Notes (Addendum)
 DISCHARGE NOTE HOME OSCEOLA DEPAZ to be discharged Home per MD order. Discussed prescriptions and follow up appointments with the patient. Prescriptions given to patient; medication list explained in detail. Patient verbalized understanding.  Skin clean, dry and intact without evidence of skin break down, no evidence of skin tears noted. IV catheter discontinued intact. Site without signs and symptoms of complications. Dressing and pressure applied. Pt denies pain at the site currently. No complaints noted.  See Lda for surgical incisions at discahrge Patient free of lines, drains, and wounds.   An After Visit Summary (AVS) was printed and given to the patient and all education reviewed including BEFAST and Vascular details. Patient escorted via wheelchair, and discharged home via private auto.  Peyton SHAUNNA Pepper, RN

## 2024-06-16 NOTE — Progress Notes (Signed)
    Subjective  - POD # 1, status post right TCAR  No complaints this morning. Has ambulated in his room and voided.  Tolerating diet   Physical Exam:  Right neck incision with small fullness Neurologically intact Left groin cannulation site without complication       Assessment/Plan:  POD # 1, status post right TCAR  Patient is recovering very well.  He was started on ephedrine  for history of bradycardia after his left TCAR.  I would like for him to go home on this for 3 days so as to avoid any heart rate or blood pressure issues.  Anticipate discharge home today.  Wells Cache Decoursey 06/16/2024 7:39 AM --  Vitals:   06/16/24 0358 06/16/24 0600  BP:  (!) 133/56  Pulse: 61 (!) 55  Resp:    Temp:    SpO2: 95% 99%    Intake/Output Summary (Last 24 hours) at 06/16/2024 0739 Last data filed at 06/15/2024 1502 Gross per 24 hour  Intake 1521.24 ml  Output 20 ml  Net 1501.24 ml     Laboratory CBC    Component Value Date/Time   WBC 12.7 (H) 06/16/2024 0348   HGB 10.3 (L) 06/16/2024 0348   HGB 10.6 (L) 12/11/2020 1231   HCT 32.1 (L) 06/16/2024 0348   HCT 33.7 (L) 12/11/2020 1231   PLT 211 06/16/2024 0348   PLT 520 (H) 12/11/2020 1231    BMET    Component Value Date/Time   NA 137 06/16/2024 0348   NA 139 12/11/2020 1231   K 4.0 06/16/2024 0348   CL 107 06/16/2024 0348   CO2 21 (L) 06/16/2024 0348   GLUCOSE 132 (H) 06/16/2024 0348   BUN 21 06/16/2024 0348   BUN 18 12/11/2020 1231   CREATININE 1.29 (H) 06/16/2024 0348   CALCIUM  8.4 (L) 06/16/2024 0348   GFRNONAA 57 (L) 06/16/2024 0348    COAG Lab Results  Component Value Date   INR 1.0 06/13/2024   INR 1.1 12/05/2020   INR 1.3 (H) 11/21/2020   No results found for: PTT  Antibiotics Anti-infectives (From admission, onward)    Start     Dose/Rate Route Frequency Ordered Stop   06/15/24 1500  ceFAZolin  (ANCEF ) IVPB 2g/100 mL premix        2 g 200 mL/hr over 30 Minutes Intravenous Every 8 hours  06/15/24 1316 06/15/24 2238   06/15/24 0634  ceFAZolin  (ANCEF ) IVPB 2g/100 mL premix        2 g 200 mL/hr over 30 Minutes Intravenous 30 min pre-op 06/15/24 0634 06/15/24 0900   06/15/24 0634  ceFAZolin  (ANCEF ) IVPB 2g/100 mL premix  Status:  Discontinued        2 g 200 mL/hr over 30 Minutes Intravenous 30 min pre-op 06/15/24 0634 06/15/24 0635        V. Malvina Serene CLORE, M.D., [facility] [place] Office: [phone] Pager:  [phone]

## 2024-06-17 ENCOUNTER — Telehealth: Payer: Self-pay

## 2024-06-17 LAB — POCT ACTIVATED CLOTTING TIME: Activated Clotting Time: 325 s

## 2024-06-17 NOTE — Telephone Encounter (Signed)
 Martin [name] called to verify reason for Sudafed RX was BP support. She was given information from expert of provider Teretha Damme PA, note (06/16/2024) below:  He will also have 3 days of sudafed to take to avoid any bradycardia or hypotension events at home.  Martin [name] verbalized understanding and appreciation of this information.

## 2024-07-15 ENCOUNTER — Other Ambulatory Visit: Payer: Self-pay

## 2024-07-15 DIAGNOSIS — I6523 Occlusion and stenosis of bilateral carotid arteries: Secondary | ICD-10-CM

## 2024-07-22 NOTE — Progress Notes (Unsigned)
  TCAR post operative follow up    CC:  follow up. Requesting Provider:  Lennie Boom, MD  HPI: This is a 78 y.o. male here for follow up for carotid artery stenosis.  Pt is s/p left TCAR for symptomatic carotid artery stenosis on 02/24/2024 by Dr. Serene.    He underwent right TCAR for asymptomatic carotid artery stenosis on 06/15/2024 also by Dr. Serene.   Pt returns today for follow up.  He states he is having trouble with his incision.  He states that it is red and his wife describes it as having a little knot and he pulled this up and it was suture and he cut it.  He denies any fevers.  It is tender to touch.    Pt denies any amaurosis fugax, speech difficulties, weakness, numbness, paralysis or clumsiness or facial droop.    He is compliant with his asa/statin/plavix .  He goes to follow up with neurology next months.    The pt is on a statin for cholesterol management.  The pt is on a daily aspirin .   Other AC:  Plavix  The pt is not on medication for hypertension.   The pt is not on medication for diabetes Tobacco hx:  never   PHYSICAL EXAMINATION:  Today's Vitals   07/25/24 1355 07/25/24 1357  BP: (!) 111/57 113/73  Pulse: 67 69  Temp: 97.7 F (36.5 C)   TempSrc: Temporal   Weight: 211 lb 3.2 oz (95.8 kg)   PainSc: 0-No pain    Body mass index is 27.86 kg/m.   General:  WDWN in NAD; vital signs documented above Gait: Not observed HENT: WNL, normocephalic Pulmonary: normal non-labored breathing Cardiac: regular HR, without carotid bruits Incision: well healed with redness laterally as pictured    Non-Invasive Vascular Imaging:   Carotid Duplex on 07/25/2024 Patent stent without stenosis bilaterally  Previous Carotid duplex on 03/28/2024: Right: 60-79% ICA stenosis Left:   patent stent without stenosis  CTA revealed right ICA stenosis > 80%    ASSESSMENT/PLAN:: 78 y.o. male here for follow up carotid artery stenosis and is s/p left TCAR for symptomatic  carotid artery stenosis on 02/24/2024 by Dr. Serene.    He underwent right TCAR for asymptomatic carotid artery stenosis on 06/15/2024 also by Dr. Serene.     -duplex today reveals patent stents bilaterally -the lateral aspect of incision is red and tender as pictured above.  I did sent 5 day supply of Keflex  to pharmacy downstairs and instructed him to use warm compresses on this area.  Will have him f/u in 1-2 weeks for incision check.  If this completely heals before then, he will call and cancel his appt.   -discussed s/s of stroke with pt and he understands should he develop any of these sx, he will go to the nearest ER or call 911. -pt will f/u in 9 months with carotid duplex with Dr. Serene since he is not in the office today. -pt will call sooner should he have any issues. -continue statin/asa/plavix    Lucie Apt, [facility] [phone]  Clinic MD:  gretta on call MD

## 2024-07-25 ENCOUNTER — Ambulatory Visit: Attending: Surgery | Admitting: Physician Assistant

## 2024-07-25 ENCOUNTER — Ambulatory Visit (HOSPITAL_COMMUNITY)
Admission: RE | Admit: 2024-07-25 | Discharge: 2024-07-25 | Disposition: A | Discharge status: Home / Self Care | Source: Ambulatory Visit | Attending: Surgery | Admitting: Surgery

## 2024-07-25 ENCOUNTER — Other Ambulatory Visit (HOSPITAL_COMMUNITY): Payer: Self-pay

## 2024-07-25 VITALS — BP 113/73 | HR 69 | Temp 97.7°F | Wt 211.2 lb

## 2024-07-25 DIAGNOSIS — I6523 Occlusion and stenosis of bilateral carotid arteries: Secondary | ICD-10-CM

## 2024-07-25 MED ORDER — CEPHALEXIN 500 MG PO CAPS
500.0000 mg | ORAL_CAPSULE | Freq: Two times a day (BID) | ORAL | 0 refills | Status: AC
  Filled 2024-07-25: qty 10, 5d supply, fill #0

## 2024-08-01 ENCOUNTER — Ambulatory Visit
